# Patient Record
Sex: Male | Born: 1960 | Race: White | Hispanic: No | Marital: Single | Smoking: Former smoker
Health system: Southern US, Community
[De-identification: ages and names within clinical notes are randomized; demographics above are authoritative.]

## PROBLEM LIST (undated history)

## (undated) DIAGNOSIS — Z973 Presence of spectacles and contact lenses: Secondary | ICD-10-CM

## (undated) DIAGNOSIS — L259 Unspecified contact dermatitis, unspecified cause: Secondary | ICD-10-CM

## (undated) DIAGNOSIS — E119 Type 2 diabetes mellitus without complications: Secondary | ICD-10-CM

## (undated) DIAGNOSIS — M129 Arthropathy, unspecified: Secondary | ICD-10-CM

## (undated) DIAGNOSIS — R011 Cardiac murmur, unspecified: Secondary | ICD-10-CM

## (undated) DIAGNOSIS — G8929 Other chronic pain: Secondary | ICD-10-CM

## (undated) DIAGNOSIS — R51 Headache: Secondary | ICD-10-CM

## (undated) DIAGNOSIS — M869 Osteomyelitis, unspecified: Secondary | ICD-10-CM

## (undated) DIAGNOSIS — E785 Hyperlipidemia, unspecified: Secondary | ICD-10-CM

## (undated) DIAGNOSIS — R0602 Shortness of breath: Secondary | ICD-10-CM

## (undated) DIAGNOSIS — M549 Dorsalgia, unspecified: Secondary | ICD-10-CM

## (undated) DIAGNOSIS — M1990 Unspecified osteoarthritis, unspecified site: Secondary | ICD-10-CM

## (undated) DIAGNOSIS — G709 Myoneural disorder, unspecified: Secondary | ICD-10-CM

## (undated) DIAGNOSIS — J40 Bronchitis, not specified as acute or chronic: Secondary | ICD-10-CM

## (undated) HISTORY — PX: HX BELOW KNEE AMPUTATION: SHX116

## (undated) HISTORY — PX: HX TONSILLECTOMY: SHX27

## (undated) HISTORY — PX: HX ADENOIDECTOMY: SHX29

## (undated) HISTORY — PX: ESOPHAGOGASTRODUODENOSCOPY: SHX1529

## (undated) HISTORY — PX: TONSILLECTOMY: SUR1361

## (undated) HISTORY — PX: AMPUTATION: SHX166

## (undated) HISTORY — PX: IRRIGATION AND DEBRIDEMENT KNEE: SHX5185

---

## 2010-10-29 HISTORY — PX: TOE AMPUTATION: SHX809

## 2012-03-19 ENCOUNTER — Encounter (HOSPITAL_BASED_OUTPATIENT_CLINIC_OR_DEPARTMENT_OTHER): Payer: PRIVATE HEALTH INSURANCE | Attending: General Surgery

## 2012-03-19 DIAGNOSIS — L97809 Non-pressure chronic ulcer of other part of unspecified lower leg with unspecified severity: Secondary | ICD-10-CM | POA: Insufficient documentation

## 2012-03-19 DIAGNOSIS — L97509 Non-pressure chronic ulcer of other part of unspecified foot with unspecified severity: Secondary | ICD-10-CM | POA: Insufficient documentation

## 2012-03-19 DIAGNOSIS — I872 Venous insufficiency (chronic) (peripheral): Secondary | ICD-10-CM | POA: Insufficient documentation

## 2012-03-19 DIAGNOSIS — E1169 Type 2 diabetes mellitus with other specified complication: Secondary | ICD-10-CM | POA: Insufficient documentation

## 2012-03-19 NOTE — Progress Notes (Signed)
[facility]  NAME:  Gabriel Howe, PROPES NO.:  [id number]  MEDICAL RECORD NO.:  [id number]      DATE OF BIRTH:  18-Nov-1960  PHYSICIAN:  Ardath Sax, M.D.           VISIT DATE:                                  OFFICE VISIT   This is a very nice gentleman, 51 years of age, who has a diabetes for about 15 years.  He is on metformin for this problem.  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  He enters our wound clinic because of a diabetic foot ulcer on his first MP joint plantar aspect of the left foot.  He also has bilateral venous stasis ulcers,  3-4 on each leg, that are couple of centimeter in diameter.  He has been treated before with Unna boots.  He has also had cast before. He has had many other treatments including Apligraf.  Today, we debrided these ulcers and are putting him with collagen in all the wounds and Unna boots.  He was advised to wear his off-loading boot on the left leg to try to get the weight off of his foot as I think it is contributing to this ulcer on the plantar aspect, which is about 2 cm in diameter.  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  He was afebrile.  His blood pressure is 140/84.  His temperature is 98.6.  He is very agreeable and I think will cooperate in any way.  He can try to get these things healed.  He thinks it would be difficult for him to wear cast, but he feels like he can certainly put up or wear any off-loading boot.  So, we will do that and will offload him and will use collagen and perhaps think about using Dermagrafts when he returns.  He will come back in a week.     Ardath Sax, M.D.     PP/MEDQ  D:  03/19/2012  T:  03/19/2012  Job:  [id number]

## 2012-04-02 ENCOUNTER — Encounter (HOSPITAL_BASED_OUTPATIENT_CLINIC_OR_DEPARTMENT_OTHER): Payer: PRIVATE HEALTH INSURANCE | Attending: General Surgery

## 2012-04-02 DIAGNOSIS — E1169 Type 2 diabetes mellitus with other specified complication: Secondary | ICD-10-CM | POA: Insufficient documentation

## 2012-04-02 DIAGNOSIS — I872 Venous insufficiency (chronic) (peripheral): Secondary | ICD-10-CM | POA: Insufficient documentation

## 2012-04-02 DIAGNOSIS — L97509 Non-pressure chronic ulcer of other part of unspecified foot with unspecified severity: Secondary | ICD-10-CM | POA: Insufficient documentation

## 2012-04-04 ENCOUNTER — Encounter (INDEPENDENT_AMBULATORY_CARE_PROVIDER_SITE_OTHER): Payer: PRIVATE HEALTH INSURANCE | Admitting: *Deleted

## 2012-04-04 ENCOUNTER — Ambulatory Visit (INDEPENDENT_AMBULATORY_CARE_PROVIDER_SITE_OTHER): Payer: PRIVATE HEALTH INSURANCE | Admitting: *Deleted

## 2012-04-04 DIAGNOSIS — I739 Peripheral vascular disease, unspecified: Secondary | ICD-10-CM

## 2012-04-04 DIAGNOSIS — I872 Venous insufficiency (chronic) (peripheral): Secondary | ICD-10-CM

## 2012-04-09 NOTE — Procedures (Unsigned)
DUPLEX DEEP VENOUS EXAM - LOWER EXTREMITY  INDICATION:  Chronic venous insufficiency.  HISTORY:  Edema:  No Trauma/Surgery:  No Pain:  No PE:  No Previous DVT:  No Anticoagulants: Other:  Chronic lower extremity ulcers for the last 20 years off and on  DUPLEX EXAM:               CFV   SFV   PopV  PTV    GSV               R  L  R  L  R  L  R   L  R  L Thrombosis    o  o  o  o  o  o         o  o Spontaneous   +  +  +  +  +  +         +  + Phasic        +  +  +  +  +  +         +  + Augmentation  +  +  +  +  +  +         +  + Compressible  +  +  +  +  +  +         +  + Competent     o  o  o  +  o  +         o  o  Legend:  + - yes  o - no  p - partial  D - decreased  IMPRESSION: 1. No evidence of venous thrombus noted in the bilateral femoral     popliteal venous systems and the bilateral great saphenous veins. 2. Unable to adequately visualize the bilateral calf veins due to     patient body habitus and lower extremity wounds. 3. Reflux of >500 milliseconds noted in the bilateral common femoral,     right femoral and right popliteal veins.  Reflux of >500     milliseconds noted throughout the right great saphenous vein with     diameter measurements ranging from 0.7 to 1.0 cm.  Reflux of >500     milliseconds noted in the left proximal thigh great saphenous vein     with diameter measurements ranging from 0.69 to 0.85 cm.   _____________________________ Larina Earthly, M.D.  CH/MEDQ  D:  04/07/2012  T:  04/07/2012  Job:  [id number]

## 2012-04-10 LAB — GLUCOSE, CAPILLARY: Glucose-Capillary: 309 mg/dL — ABNORMAL HIGH (ref 70–99)

## 2012-04-23 ENCOUNTER — Encounter (HOSPITAL_BASED_OUTPATIENT_CLINIC_OR_DEPARTMENT_OTHER): Payer: PRIVATE HEALTH INSURANCE

## 2012-04-23 NOTE — Progress Notes (Signed)
[facility]  NAME:  FREMAN, LAPAGE NO.:  [id number]  MEDICAL RECORD NO.:  [id number]      DATE OF BIRTH:  1961-10-27  PHYSICIAN:  Ardath Sax, M.D.           VISIT DATE:                                  OFFICE VISIT   Mr. Dacosta is a 51 year old diabetic man who has a dual diagnosis of diabetic foot ulcer on the plantar aspect of his left foot and he has got 3 venous stasis ulcers on the left leg and 4 venous stasis ulcers on the right leg.  He has been plagued with venous stasis for many years and has had problems from time to time with these ulcers.  They are being treated with Unna boots.  Today we got permission to put a Dermagraft on the diabetic foot ulcer on plantar aspect of his left foot, and we would like to get permission to put Apligraf on the venous stasis ulcers of both of his legs and that in conjunction with the Unna boots I think give [place] a good chance to get these ulcers heal.  So we would like to have his insurance company to give [place] the privilege to go ahead and use these treatments for the treatment of both the diabetic foot ulcer and venous stasis ulcers.     Ardath Sax, M.D.     PP/MEDQ  D:  04/23/2012  T:  04/23/2012  Job:  [id number]

## 2012-04-30 ENCOUNTER — Encounter (HOSPITAL_BASED_OUTPATIENT_CLINIC_OR_DEPARTMENT_OTHER): Payer: PRIVATE HEALTH INSURANCE | Attending: General Surgery

## 2012-04-30 DIAGNOSIS — E1169 Type 2 diabetes mellitus with other specified complication: Secondary | ICD-10-CM | POA: Insufficient documentation

## 2012-04-30 DIAGNOSIS — L97809 Non-pressure chronic ulcer of other part of unspecified lower leg with unspecified severity: Secondary | ICD-10-CM | POA: Insufficient documentation

## 2012-04-30 DIAGNOSIS — E1142 Type 2 diabetes mellitus with diabetic polyneuropathy: Secondary | ICD-10-CM | POA: Insufficient documentation

## 2012-04-30 DIAGNOSIS — L97509 Non-pressure chronic ulcer of other part of unspecified foot with unspecified severity: Secondary | ICD-10-CM | POA: Insufficient documentation

## 2012-04-30 DIAGNOSIS — E1149 Type 2 diabetes mellitus with other diabetic neurological complication: Secondary | ICD-10-CM | POA: Insufficient documentation

## 2012-04-30 DIAGNOSIS — S98139A Complete traumatic amputation of one unspecified lesser toe, initial encounter: Secondary | ICD-10-CM | POA: Insufficient documentation

## 2012-04-30 DIAGNOSIS — Z79899 Other long term (current) drug therapy: Secondary | ICD-10-CM | POA: Insufficient documentation

## 2012-04-30 DIAGNOSIS — I872 Venous insufficiency (chronic) (peripheral): Secondary | ICD-10-CM | POA: Insufficient documentation

## 2012-05-09 NOTE — Progress Notes (Signed)
[facility]  NAME:  Gabriel Howe, Gabriel Howe NO.:  [id number]  MEDICAL RECORD NO.:  [id number]      DATE OF BIRTH:  Dec 19, 1960  PHYSICIAN:  Ardath Sax, M.D.           VISIT DATE:                                  OFFICE VISIT   He is a 51 year old gentleman who has been a diabetic for 15 years that he treats with metformin and diet.  He has had a right great toe amputation because of a diabetic foot ulcer, and he is here because of multiple ulcers on both of his legs and on the plantar aspect of his left foot.  These ulcers are contributed to by diabetes and diabetic neuropathy, and he also has venous stasis to a marked degree.  We have been treating him with collagen and Unna boots, and now he is ready for biologic treatments,  because he is a diabetic and these are both venous stasis and diabetic in origin, we put a Dermagraft on the diabetic foot ulcer on the left foot, and we put Apligraf on the legs where he had venous stasis ulcers bilaterally, and we will. plan on following up with weekly visits and several more applications of these biologics.     Ardath Sax, M.D.     PP/MEDQ  D:  05/09/2012  T:  05/09/2012  Job:  [id number]

## 2012-06-04 ENCOUNTER — Encounter (HOSPITAL_BASED_OUTPATIENT_CLINIC_OR_DEPARTMENT_OTHER): Payer: PRIVATE HEALTH INSURANCE | Attending: General Surgery

## 2012-06-04 DIAGNOSIS — E1169 Type 2 diabetes mellitus with other specified complication: Secondary | ICD-10-CM | POA: Insufficient documentation

## 2012-06-04 DIAGNOSIS — Z79899 Other long term (current) drug therapy: Secondary | ICD-10-CM | POA: Insufficient documentation

## 2012-06-04 DIAGNOSIS — I872 Venous insufficiency (chronic) (peripheral): Secondary | ICD-10-CM | POA: Insufficient documentation

## 2012-06-04 DIAGNOSIS — S98139A Complete traumatic amputation of one unspecified lesser toe, initial encounter: Secondary | ICD-10-CM | POA: Insufficient documentation

## 2012-06-04 DIAGNOSIS — L97509 Non-pressure chronic ulcer of other part of unspecified foot with unspecified severity: Secondary | ICD-10-CM | POA: Insufficient documentation

## 2012-06-04 DIAGNOSIS — E1142 Type 2 diabetes mellitus with diabetic polyneuropathy: Secondary | ICD-10-CM | POA: Insufficient documentation

## 2012-06-04 DIAGNOSIS — L97809 Non-pressure chronic ulcer of other part of unspecified lower leg with unspecified severity: Secondary | ICD-10-CM | POA: Insufficient documentation

## 2012-06-04 DIAGNOSIS — E1149 Type 2 diabetes mellitus with other diabetic neurological complication: Secondary | ICD-10-CM | POA: Insufficient documentation

## 2012-07-02 ENCOUNTER — Encounter (HOSPITAL_BASED_OUTPATIENT_CLINIC_OR_DEPARTMENT_OTHER): Payer: PRIVATE HEALTH INSURANCE | Attending: General Surgery

## 2012-07-02 DIAGNOSIS — E1169 Type 2 diabetes mellitus with other specified complication: Secondary | ICD-10-CM | POA: Insufficient documentation

## 2012-07-02 DIAGNOSIS — L97809 Non-pressure chronic ulcer of other part of unspecified lower leg with unspecified severity: Secondary | ICD-10-CM | POA: Insufficient documentation

## 2012-07-02 DIAGNOSIS — I872 Venous insufficiency (chronic) (peripheral): Secondary | ICD-10-CM | POA: Insufficient documentation

## 2012-07-30 ENCOUNTER — Encounter (HOSPITAL_BASED_OUTPATIENT_CLINIC_OR_DEPARTMENT_OTHER): Payer: PRIVATE HEALTH INSURANCE | Attending: General Surgery

## 2012-07-30 DIAGNOSIS — E1169 Type 2 diabetes mellitus with other specified complication: Secondary | ICD-10-CM | POA: Insufficient documentation

## 2012-07-30 DIAGNOSIS — I890 Lymphedema, not elsewhere classified: Secondary | ICD-10-CM | POA: Insufficient documentation

## 2012-07-30 DIAGNOSIS — L97809 Non-pressure chronic ulcer of other part of unspecified lower leg with unspecified severity: Secondary | ICD-10-CM | POA: Insufficient documentation

## 2012-07-30 DIAGNOSIS — I872 Venous insufficiency (chronic) (peripheral): Secondary | ICD-10-CM | POA: Insufficient documentation

## 2012-07-30 DIAGNOSIS — L97309 Non-pressure chronic ulcer of unspecified ankle with unspecified severity: Secondary | ICD-10-CM | POA: Insufficient documentation

## 2012-09-03 ENCOUNTER — Encounter (HOSPITAL_BASED_OUTPATIENT_CLINIC_OR_DEPARTMENT_OTHER): Payer: PRIVATE HEALTH INSURANCE | Attending: General Surgery

## 2012-09-03 DIAGNOSIS — M069 Rheumatoid arthritis, unspecified: Secondary | ICD-10-CM | POA: Insufficient documentation

## 2012-09-03 DIAGNOSIS — Z79899 Other long term (current) drug therapy: Secondary | ICD-10-CM | POA: Insufficient documentation

## 2012-09-03 DIAGNOSIS — E1169 Type 2 diabetes mellitus with other specified complication: Secondary | ICD-10-CM | POA: Insufficient documentation

## 2012-09-03 DIAGNOSIS — L97509 Non-pressure chronic ulcer of other part of unspecified foot with unspecified severity: Secondary | ICD-10-CM | POA: Insufficient documentation

## 2012-09-03 DIAGNOSIS — L97809 Non-pressure chronic ulcer of other part of unspecified lower leg with unspecified severity: Secondary | ICD-10-CM | POA: Insufficient documentation

## 2012-09-03 NOTE — Progress Notes (Signed)
[facility]  NAME:  Gabriel Howe, Gabriel Howe NO.:  [id number]  MEDICAL RECORD NO.:  [id number]      DATE OF BIRTH:  [birth year]  PHYSICIAN:  Ardath Sax, M.D.           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  He also has rheumatoid arthritis.  He is 6 feet, 8 inches tall and weighs 425 pounds and the only medicines he take is lovastatin, metformin and glipizide.  He has been plagued with both venous stasis ulcers and diabetic foot ulcers. At the present time his big problem is on the plantar aspect of his left great toe.  He has got a Wagner 3 diabetic foot ulcer.  He also has 1 on the dorsal aspect of his right second toe.  He has venous ulcers on the medial side of both of his legs that have been treated with Apligraf. We have had some success with the venous ulcers, but none with the diabetic foot ulcers Wagner 3.  We are applying for permission to use hyperbaric oxygen to try to get this man out of his misery with the diabetic foot ulcers.  I think that having had these this long and having already tried Apligraf that have not really helped, it would be worthwhile to continue our active treatments here at the wound clinic weekly, but I feel that the hyperbaric oxygen treatments would be of great benefit on trying to get this man over his problem.     Ardath Sax, M.D.     PP/MEDQ  D:  09/03/2012  T:  09/03/2012  Job:  [id number]

## 2012-09-05 ENCOUNTER — Ambulatory Visit (HOSPITAL_COMMUNITY)
Admission: RE | Admit: 2012-09-05 | Discharge: 2012-09-05 | Disposition: A | Discharge status: Home / Self Care | Payer: PRIVATE HEALTH INSURANCE | Source: Ambulatory Visit | Attending: General Surgery | Admitting: General Surgery

## 2012-09-05 ENCOUNTER — Other Ambulatory Visit (HOSPITAL_BASED_OUTPATIENT_CLINIC_OR_DEPARTMENT_OTHER): Payer: Self-pay | Admitting: General Surgery

## 2012-09-05 DIAGNOSIS — L98499 Non-pressure chronic ulcer of skin of other sites with unspecified severity: Secondary | ICD-10-CM

## 2012-09-05 DIAGNOSIS — T8189XA Other complications of procedures, not elsewhere classified, initial encounter: Secondary | ICD-10-CM | POA: Insufficient documentation

## 2012-09-05 DIAGNOSIS — Y849 Medical procedure, unspecified as the cause of abnormal reaction of the patient, or of later complication, without mention of misadventure at the time of the procedure: Secondary | ICD-10-CM | POA: Insufficient documentation

## 2012-09-05 DIAGNOSIS — M899 Disorder of bone, unspecified: Secondary | ICD-10-CM | POA: Insufficient documentation

## 2012-09-05 DIAGNOSIS — S98919A Complete traumatic amputation of unspecified foot, level unspecified, initial encounter: Secondary | ICD-10-CM | POA: Insufficient documentation

## 2012-09-05 DIAGNOSIS — M7730 Calcaneal spur, unspecified foot: Secondary | ICD-10-CM | POA: Insufficient documentation

## 2012-09-05 DIAGNOSIS — Z01818 Encounter for other preprocedural examination: Secondary | ICD-10-CM | POA: Insufficient documentation

## 2012-09-05 DIAGNOSIS — M619 Calcification and ossification of muscle, unspecified: Secondary | ICD-10-CM | POA: Insufficient documentation

## 2012-09-05 DIAGNOSIS — M949 Disorder of cartilage, unspecified: Secondary | ICD-10-CM | POA: Insufficient documentation

## 2012-09-10 ENCOUNTER — Emergency Department (HOSPITAL_COMMUNITY)
Admission: EM | Admit: 2012-09-10 | Discharge: 2012-09-10 | Disposition: A | Discharge status: Home / Self Care | Payer: PRIVATE HEALTH INSURANCE | Attending: Emergency Medicine | Admitting: Emergency Medicine

## 2012-09-10 ENCOUNTER — Encounter (HOSPITAL_COMMUNITY): Payer: Self-pay | Admitting: Emergency Medicine

## 2012-09-10 DIAGNOSIS — E1169 Type 2 diabetes mellitus with other specified complication: Secondary | ICD-10-CM | POA: Insufficient documentation

## 2012-09-10 DIAGNOSIS — Y838 Other surgical procedures as the cause of abnormal reaction of the patient, or of later complication, without mention of misadventure at the time of the procedure: Secondary | ICD-10-CM | POA: Insufficient documentation

## 2012-09-10 DIAGNOSIS — L97509 Non-pressure chronic ulcer of other part of unspecified foot with unspecified severity: Secondary | ICD-10-CM | POA: Insufficient documentation

## 2012-09-10 DIAGNOSIS — E11621 Type 2 diabetes mellitus with foot ulcer: Secondary | ICD-10-CM

## 2012-09-10 LAB — BASIC METABOLIC PANEL
CO2: 25 mEq/L (ref 19–32)
Chloride: 96 mEq/L (ref 96–112)
Glucose, Bld: 357 mg/dL — ABNORMAL HIGH (ref 70–99)
Potassium: 4.4 mEq/L (ref 3.5–5.1)
Sodium: 131 mEq/L — ABNORMAL LOW (ref 135–145)

## 2012-09-10 LAB — CBC WITH DIFFERENTIAL/PLATELET
Eosinophils Absolute: 0.1 10*3/uL (ref 0.0–0.7)
Eosinophils Relative: 1 % (ref 0–5)
Hemoglobin: 14.3 g/dL (ref 13.0–17.0)
Lymphocytes Relative: 32 % (ref 12–46)
Lymphs Abs: 3.3 10*3/uL (ref 0.7–4.0)
MCH: 25.8 pg — ABNORMAL LOW (ref 26.0–34.0)
MCV: 78.2 fL (ref 78.0–100.0)
Monocytes Relative: 8 % (ref 3–12)
RBC: 5.54 MIL/uL (ref 4.22–5.81)

## 2012-09-10 MED ORDER — PENICILLIN V POTASSIUM 500 MG PO TABS: 500.0000 mg | ORAL_TABLET | Freq: Four times a day (QID) | ORAL | Status: DC

## 2012-09-10 MED ORDER — SODIUM CHLORIDE 0.9 % IV SOLN
INTRAVENOUS | Status: DC
  Administered 2012-09-10: 10:00:00 via INTRAVENOUS

## 2012-09-10 MED ORDER — CIPROFLOXACIN HCL 500 MG PO TABS: 500.0000 mg | ORAL_TABLET | Freq: Two times a day (BID) | ORAL | Status: DC

## 2012-09-10 MED ORDER — CIPROFLOXACIN IN D5W 400 MG/200ML IV SOLN
400.0000 mg | Freq: Once | INTRAVENOUS | Status: AC
  Administered 2012-09-10: 400 mg via INTRAVENOUS
  Filled 2012-09-10: qty 200

## 2012-09-10 MED ORDER — VANCOMYCIN HCL IN DEXTROSE 1-5 GM/200ML-% IV SOLN
1000.0000 mg | Freq: Once | INTRAVENOUS | Status: AC
  Administered 2012-09-10: 1000 mg via INTRAVENOUS
  Filled 2012-09-10: qty 200

## 2012-09-10 NOTE — ED Provider Notes (Addendum)
History     CSN: [id number]  Arrival date & time 09/10/12  0847   First MD Initiated Contact with Patient 09/10/12 [phone]      Chief Complaint  Patient presents with  . Wound Infection    (Consider location/radiation/quality/duration/timing/severity/associated sxs/prior treatment) The history is provided by the patient and medical records.  -year-old morbidly obese, male, with diabetes.  Presents emergency apartment from the wound center for evaluation of an ulcer on his right second toe.  We'll treat him it was positive for bacteria, so they sent him here for IV antibiotics.  The patient denies pain.  He denies nausea, vomiting, fevers, or chills.  He is asymptomatic.  Past Medical History  Diagnosis Date  . Diabetes mellitus without complication     Past Surgical History  Procedure Date  . Toe amputation     History reviewed. No pertinent family history.  History  Substance Use Topics  . Smoking status: Never Smoker   . Smokeless tobacco: Not on file  . Alcohol Use: No      Review of Systems  Skin:       Right lower extremity ulceration  All other systems reviewed and are negative.    Allergies  Bactroban  Home Medications   Current Outpatient Rx  Name  Route  Sig  Dispense  Refill  . DOXYCYCLINE HYCLATE 100 MG PO CAPS   Oral   Take 100 mg by mouth 2 (two) times daily.           BP 138/84  Pulse 88  Temp 98.5 F (36.9 C) (Oral)  Resp 18  SpO2 99%  Physical Exam  Nursing note and vitals reviewed. Constitutional: He is oriented to person, place, and time. No distress.       Morbidly obese  HENT:  Head: Normocephalic and atraumatic.  Eyes: Conjunctivae normal are normal.  Neck: Normal range of motion.  Pulmonary/Chest: Effort normal.  Musculoskeletal: Normal range of motion. He exhibits no edema and no tenderness.  Neurological: He is alert and oriented to person, place, and time.  Skin: Skin is warm and dry.       One half syndrome,  circular ulceration over the dorsal medial aspect of the second toe on the right foot first toe has been amputated  Psychiatric: He has a normal mood and affect. Thought content normal.    ED Course  Procedures (including critical care time)   Labs Reviewed  BASIC METABOLIC PANEL  CBC WITH DIFFERENTIAL   No results found.   No diagnosis found.  Right second toe ulceration.  No symptoms to systemic illness  MDM  Diabetic foot ulcer - no evidence of systemic illness.        Cheri Guppy, MD 09/10/12 0930  Cheri Guppy, MD 09/10/12 [phone]

## 2012-09-10 NOTE — ED Notes (Signed)
Pt was sent here by wound center for IV antibiotics.  Pt was on doxycycline for bilateral foot wounds but was cultured and found to have an abundance of "I don't remember what it's called bacteria".  Pt states he has had fever and chills this week.  Pt has diabetic neuropathy and had his big toe on the right foot removed a year and a half ago but has had wound infections show up on both feet and up his shin.  "I've been dealing with this on and off for 20 years.

## 2012-09-29 ENCOUNTER — Encounter (HOSPITAL_BASED_OUTPATIENT_CLINIC_OR_DEPARTMENT_OTHER): Payer: PRIVATE HEALTH INSURANCE | Attending: General Surgery

## 2012-09-29 DIAGNOSIS — E1169 Type 2 diabetes mellitus with other specified complication: Secondary | ICD-10-CM | POA: Insufficient documentation

## 2012-09-29 DIAGNOSIS — I87309 Chronic venous hypertension (idiopathic) without complications of unspecified lower extremity: Secondary | ICD-10-CM | POA: Insufficient documentation

## 2012-09-29 DIAGNOSIS — L97809 Non-pressure chronic ulcer of other part of unspecified lower leg with unspecified severity: Secondary | ICD-10-CM | POA: Insufficient documentation

## 2012-09-29 DIAGNOSIS — I872 Venous insufficiency (chronic) (peripheral): Secondary | ICD-10-CM | POA: Insufficient documentation

## 2012-09-29 DIAGNOSIS — L97509 Non-pressure chronic ulcer of other part of unspecified foot with unspecified severity: Secondary | ICD-10-CM | POA: Insufficient documentation

## 2012-10-01 ENCOUNTER — Encounter (HOSPITAL_BASED_OUTPATIENT_CLINIC_OR_DEPARTMENT_OTHER): Payer: PRIVATE HEALTH INSURANCE

## 2012-10-13 LAB — GLUCOSE, CAPILLARY
Glucose-Capillary: 419 mg/dL — ABNORMAL HIGH (ref 70–99)
Glucose-Capillary: 376 mg/dL — ABNORMAL HIGH (ref 70–99)

## 2012-10-14 LAB — GLUCOSE, CAPILLARY
Glucose-Capillary: 358 mg/dL — ABNORMAL HIGH (ref 70–99)
Glucose-Capillary: 348 mg/dL — ABNORMAL HIGH (ref 70–99)

## 2012-10-15 LAB — GLUCOSE, CAPILLARY: Glucose-Capillary: 333 mg/dL — ABNORMAL HIGH (ref 70–99)

## 2012-10-16 LAB — GLUCOSE, CAPILLARY
Glucose-Capillary: 338 mg/dL — ABNORMAL HIGH (ref 70–99)
Glucose-Capillary: 344 mg/dL — ABNORMAL HIGH (ref 70–99)

## 2012-10-20 LAB — GLUCOSE, CAPILLARY: Glucose-Capillary: 349 mg/dL — ABNORMAL HIGH (ref 70–99)

## 2012-10-23 LAB — GLUCOSE, CAPILLARY: Glucose-Capillary: 322 mg/dL — ABNORMAL HIGH (ref 70–99)

## 2012-10-24 LAB — GLUCOSE, CAPILLARY
Glucose-Capillary: 344 mg/dL — ABNORMAL HIGH (ref 70–99)
Glucose-Capillary: 338 mg/dL — ABNORMAL HIGH (ref 70–99)

## 2012-10-28 LAB — GLUCOSE, CAPILLARY: Glucose-Capillary: 382 mg/dL — ABNORMAL HIGH (ref 70–99)

## 2012-10-30 ENCOUNTER — Encounter (HOSPITAL_BASED_OUTPATIENT_CLINIC_OR_DEPARTMENT_OTHER): Payer: PRIVATE HEALTH INSURANCE | Attending: General Surgery

## 2012-10-30 DIAGNOSIS — L97509 Non-pressure chronic ulcer of other part of unspecified foot with unspecified severity: Secondary | ICD-10-CM | POA: Insufficient documentation

## 2012-10-30 DIAGNOSIS — E1169 Type 2 diabetes mellitus with other specified complication: Secondary | ICD-10-CM | POA: Insufficient documentation

## 2012-10-30 DIAGNOSIS — I872 Venous insufficiency (chronic) (peripheral): Secondary | ICD-10-CM | POA: Insufficient documentation

## 2012-10-31 LAB — GLUCOSE, CAPILLARY
Glucose-Capillary: 426 mg/dL — ABNORMAL HIGH (ref 70–99)
Glucose-Capillary: 400 mg/dL — ABNORMAL HIGH (ref 70–99)

## 2012-11-03 LAB — GLUCOSE, CAPILLARY
Glucose-Capillary: 372 mg/dL — ABNORMAL HIGH (ref 70–99)
Glucose-Capillary: 378 mg/dL — ABNORMAL HIGH (ref 70–99)

## 2012-11-04 LAB — GLUCOSE, CAPILLARY: Glucose-Capillary: 336 mg/dL — ABNORMAL HIGH (ref 70–99)

## 2012-11-05 LAB — GLUCOSE, CAPILLARY: Glucose-Capillary: 327 mg/dL — ABNORMAL HIGH (ref 70–99)

## 2012-11-07 LAB — GLUCOSE, CAPILLARY
Glucose-Capillary: 329 mg/dL — ABNORMAL HIGH (ref 70–99)
Glucose-Capillary: 325 mg/dL — ABNORMAL HIGH (ref 70–99)

## 2012-11-17 LAB — GLUCOSE, CAPILLARY: Glucose-Capillary: 334 mg/dL — ABNORMAL HIGH (ref 70–99)

## 2012-11-19 LAB — GLUCOSE, CAPILLARY
Glucose-Capillary: 318 mg/dL — ABNORMAL HIGH (ref 70–99)
Glucose-Capillary: 315 mg/dL — ABNORMAL HIGH (ref 70–99)

## 2012-11-20 LAB — GLUCOSE, CAPILLARY: Glucose-Capillary: 381 mg/dL — ABNORMAL HIGH (ref 70–99)

## 2012-11-25 LAB — GLUCOSE, CAPILLARY
Glucose-Capillary: 325 mg/dL — ABNORMAL HIGH (ref 70–99)
Glucose-Capillary: 301 mg/dL — ABNORMAL HIGH (ref 70–99)

## 2012-12-01 ENCOUNTER — Encounter (HOSPITAL_BASED_OUTPATIENT_CLINIC_OR_DEPARTMENT_OTHER): Payer: PRIVATE HEALTH INSURANCE | Attending: General Surgery

## 2012-12-01 DIAGNOSIS — I87319 Chronic venous hypertension (idiopathic) with ulcer of unspecified lower extremity: Secondary | ICD-10-CM | POA: Insufficient documentation

## 2012-12-01 DIAGNOSIS — L97509 Non-pressure chronic ulcer of other part of unspecified foot with unspecified severity: Secondary | ICD-10-CM | POA: Insufficient documentation

## 2012-12-01 DIAGNOSIS — L97909 Non-pressure chronic ulcer of unspecified part of unspecified lower leg with unspecified severity: Secondary | ICD-10-CM | POA: Insufficient documentation

## 2012-12-01 DIAGNOSIS — E1169 Type 2 diabetes mellitus with other specified complication: Secondary | ICD-10-CM | POA: Insufficient documentation

## 2012-12-02 LAB — GLUCOSE, CAPILLARY: Glucose-Capillary: 334 mg/dL — ABNORMAL HIGH (ref 70–99)

## 2012-12-03 LAB — GLUCOSE, CAPILLARY
Glucose-Capillary: 327 mg/dL — ABNORMAL HIGH (ref 70–99)
Glucose-Capillary: 293 mg/dL — ABNORMAL HIGH (ref 70–99)

## 2012-12-04 ENCOUNTER — Encounter (HOSPITAL_BASED_OUTPATIENT_CLINIC_OR_DEPARTMENT_OTHER): Payer: PRIVATE HEALTH INSURANCE

## 2012-12-04 LAB — GLUCOSE, CAPILLARY: Glucose-Capillary: 305 mg/dL — ABNORMAL HIGH (ref 70–99)

## 2012-12-05 NOTE — Progress Notes (Signed)
[facility]  NAME:  Gabriel Howe, Gabriel Howe NO.:  [id number]  MEDICAL RECORD NO.:  [id number]      DATE OF BIRTH:  [birth year]  PHYSICIAN:  Ardath Sax, M.D.           VISIT DATE:                                  OFFICE VISIT   He is a 52 year old gentleman who we have been taking care of at the Wound Clinic for well over a year.  He is a diabetic and he has a combination of diabetic foot ulcers on the plantar aspect of his feet and venous hypertension ulcers on his calves from chronic venous stasis. He has healed one of these diabetic foot ulcers and all of the venous ulcers since he started hyperbaric oxygen treatments.  At the present time, the wounds have cleaned up very very nicely and we are putting on Dermagrafts on the plantar Wagner 3 diabetic foot ulcers and I feel because of the great size, we made an healing three of these ulcers.  I want to continue the hyperbaric oxygen for another session while we continue putting on Dermagrafts and perhaps we can finally get this gentleman's diabetic foot ulcers heal.     Ardath Sax, M.D.     PP/MEDQ  D:  12/05/2012  T:  12/05/2012  Job:  [id number]

## 2012-12-08 LAB — GLUCOSE, CAPILLARY: Glucose-Capillary: 304 mg/dL — ABNORMAL HIGH (ref 70–99)

## 2012-12-15 ENCOUNTER — Other Ambulatory Visit (HOSPITAL_BASED_OUTPATIENT_CLINIC_OR_DEPARTMENT_OTHER): Payer: Self-pay | Admitting: General Surgery

## 2012-12-15 DIAGNOSIS — T148XXA Other injury of unspecified body region, initial encounter: Secondary | ICD-10-CM

## 2012-12-17 ENCOUNTER — Inpatient Hospital Stay (HOSPITAL_COMMUNITY)
Admission: EM | Admit: 2012-12-17 | Discharge: 2012-12-21 | DRG: 294 | Disposition: A | Discharge status: Home / Self Care | Payer: BC Managed Care – PPO | Attending: Internal Medicine | Admitting: Internal Medicine

## 2012-12-17 ENCOUNTER — Encounter (HOSPITAL_COMMUNITY): Payer: Self-pay | Admitting: Emergency Medicine

## 2012-12-17 ENCOUNTER — Emergency Department (HOSPITAL_COMMUNITY): Payer: BC Managed Care – PPO

## 2012-12-17 DIAGNOSIS — M3390 Dermatopolymyositis, unspecified, organ involvement unspecified: Secondary | ICD-10-CM | POA: Diagnosis present

## 2012-12-17 DIAGNOSIS — M9080 Osteopathy in diseases classified elsewhere, unspecified site: Secondary | ICD-10-CM | POA: Diagnosis present

## 2012-12-17 DIAGNOSIS — E11621 Type 2 diabetes mellitus with foot ulcer: Secondary | ICD-10-CM | POA: Diagnosis present

## 2012-12-17 DIAGNOSIS — D72829 Elevated white blood cell count, unspecified: Secondary | ICD-10-CM

## 2012-12-17 DIAGNOSIS — E11628 Type 2 diabetes mellitus with other skin complications: Secondary | ICD-10-CM

## 2012-12-17 DIAGNOSIS — Z888 Allergy status to other drugs, medicaments and biological substances status: Secondary | ICD-10-CM

## 2012-12-17 DIAGNOSIS — E871 Hypo-osmolality and hyponatremia: Secondary | ICD-10-CM | POA: Diagnosis present

## 2012-12-17 DIAGNOSIS — S98139A Complete traumatic amputation of one unspecified lesser toe, initial encounter: Secondary | ICD-10-CM

## 2012-12-17 DIAGNOSIS — L02419 Cutaneous abscess of limb, unspecified: Secondary | ICD-10-CM | POA: Diagnosis present

## 2012-12-17 DIAGNOSIS — E119 Type 2 diabetes mellitus without complications: Secondary | ICD-10-CM | POA: Diagnosis present

## 2012-12-17 DIAGNOSIS — E1142 Type 2 diabetes mellitus with diabetic polyneuropathy: Secondary | ICD-10-CM | POA: Diagnosis present

## 2012-12-17 DIAGNOSIS — Z79899 Other long term (current) drug therapy: Secondary | ICD-10-CM

## 2012-12-17 DIAGNOSIS — L97509 Non-pressure chronic ulcer of other part of unspecified foot with unspecified severity: Secondary | ICD-10-CM | POA: Diagnosis present

## 2012-12-17 DIAGNOSIS — L03039 Cellulitis of unspecified toe: Secondary | ICD-10-CM | POA: Diagnosis present

## 2012-12-17 DIAGNOSIS — E1169 Type 2 diabetes mellitus with other specified complication: Principal | ICD-10-CM | POA: Diagnosis present

## 2012-12-17 DIAGNOSIS — M3313 Other dermatomyositis without myopathy: Secondary | ICD-10-CM | POA: Diagnosis present

## 2012-12-17 DIAGNOSIS — E1140 Type 2 diabetes mellitus with diabetic neuropathy, unspecified: Secondary | ICD-10-CM | POA: Diagnosis present

## 2012-12-17 DIAGNOSIS — L02619 Cutaneous abscess of unspecified foot: Secondary | ICD-10-CM | POA: Diagnosis present

## 2012-12-17 DIAGNOSIS — E1149 Type 2 diabetes mellitus with other diabetic neurological complication: Secondary | ICD-10-CM | POA: Diagnosis present

## 2012-12-17 DIAGNOSIS — L03119 Cellulitis of unspecified part of limb: Secondary | ICD-10-CM | POA: Diagnosis present

## 2012-12-17 DIAGNOSIS — E876 Hypokalemia: Secondary | ICD-10-CM | POA: Diagnosis not present

## 2012-12-17 DIAGNOSIS — M869 Osteomyelitis, unspecified: Secondary | ICD-10-CM | POA: Diagnosis present

## 2012-12-17 DIAGNOSIS — Z792 Long term (current) use of antibiotics: Secondary | ICD-10-CM

## 2012-12-17 DIAGNOSIS — IMO0002 Reserved for concepts with insufficient information to code with codable children: Principal | ICD-10-CM | POA: Diagnosis present

## 2012-12-17 LAB — CBC WITH DIFFERENTIAL/PLATELET
Lymphocytes Relative: 6 % — ABNORMAL LOW (ref 12–46)
Lymphs Abs: 1.1 10*3/uL (ref 0.7–4.0)
MCV: 75.7 fL — ABNORMAL LOW (ref 78.0–100.0)
Neutrophils Relative %: 90 % — ABNORMAL HIGH (ref 43–77)
Platelets: 257 10*3/uL (ref 150–400)
RBC: 5.07 MIL/uL (ref 4.22–5.81)
WBC: 19 10*3/uL — ABNORMAL HIGH (ref 4.0–10.5)

## 2012-12-17 LAB — BASIC METABOLIC PANEL
CO2: 23 mEq/L (ref 19–32)
Glucose, Bld: 334 mg/dL — ABNORMAL HIGH (ref 70–99)
Potassium: 4.3 mEq/L (ref 3.5–5.1)
Sodium: 126 mEq/L — ABNORMAL LOW (ref 135–145)

## 2012-12-17 MED ORDER — ACETAMINOPHEN 325 MG PO TABS
650.0000 mg | ORAL_TABLET | Freq: Once | ORAL | Status: AC
  Administered 2012-12-17: 650 mg via ORAL
  Filled 2012-12-17: qty 2

## 2012-12-17 MED ORDER — SODIUM CHLORIDE 0.9 % IV BOLUS (SEPSIS)
1000.0000 mL | Freq: Once | INTRAVENOUS | Status: AC
  Administered 2012-12-17: 1000 mL via INTRAVENOUS

## 2012-12-17 MED ORDER — ONDANSETRON HCL 4 MG/2ML IJ SOLN: 4.0000 mg | Freq: Once | INTRAMUSCULAR | Status: DC

## 2012-12-17 MED ORDER — VANCOMYCIN HCL IN DEXTROSE 1-5 GM/200ML-% IV SOLN
1000.0000 mg | Freq: Once | INTRAVENOUS | Status: AC
  Administered 2012-12-18: 1000 mg via INTRAVENOUS
  Filled 2012-12-17: qty 200

## 2012-12-17 MED ORDER — MORPHINE SULFATE 2 MG/ML IJ SOLN: 2.0000 mg | Freq: Once | INTRAMUSCULAR | Status: DC

## 2012-12-17 MED ORDER — PIPERACILLIN-TAZOBACTAM 3.375 G IVPB
3.3750 g | Freq: Once | INTRAVENOUS | Status: AC
  Administered 2012-12-17: 3.375 g via INTRAVENOUS
  Filled 2012-12-17: qty 50

## 2012-12-17 NOTE — ED Provider Notes (Signed)
History     CSN: [id number]  Arrival date & time 12/17/12  1851   First MD Initiated Contact with Patient 12/17/12 2113      Chief Complaint  Patient presents with  . Wound Check   HPI  History provided by the patient. Patient is a 52 year old male with history of diabetes, right great toe amputation and chronic right foot ulcers. Patient has been following up with the wound clinic for the past year or more. He has received hyperbaric treatments on his wounds. This past December he reports worsening of a wound on the bottom of his foot. He had an appointment at the wound center 2 days ago Monday with whom cultures performed but since that time reports increasing pain, foul odor, drainage, fever and chills. Patient has been currently taking Cipro and doxycycline. While at home today he was told by his doctor to discontinue emergency room for additional evaluation. He denies any aggravating or alleviating factors. Denies any other associated symptoms.    Past Medical History  Diagnosis Date  . Diabetes mellitus without complication     Past Surgical History  Procedure Laterality Date  . Toe amputation      History reviewed. No pertinent family history.  History  Substance Use Topics  . Smoking status: Never Smoker   . Smokeless tobacco: Not on file  . Alcohol Use: No      Review of Systems  Constitutional: Positive for fever, chills, diaphoresis and fatigue.  Respiratory: Negative for cough.   Gastrointestinal: Negative for vomiting and diarrhea.  All other systems reviewed and are negative.    Allergies  Bactroban  Home Medications   Current Outpatient Rx  Name  Route  Sig  Dispense  Refill  . ciprofloxacin (CIPRO) 500 MG tablet   Oral   Take 500 mg by mouth 2 (two) times daily.         Marland Kitchen doxycycline (VIBRA-TABS) 100 MG tablet   Oral   Take 100 mg by mouth 2 (two) times daily.           BP 114/64  Pulse 106  Temp(Src) 99.4 F (37.4 C) (Oral)   Resp 24  SpO2 99%  Physical Exam  Nursing note and vitals reviewed. Constitutional: He is oriented to person, place, and time. He appears well-developed and well-nourished. No distress.  Morbidly obese  HENT:  Head: Normocephalic.  Cardiovascular: Regular rhythm.  Tachycardia present.   Pulmonary/Chest: Effort normal and breath sounds normal. No respiratory distress.  Abdominal: Soft.  Musculoskeletal:  Amputation of right great toe. There is a wound to the second toe and foot. Large wounds to the bottom and medial left foot with drainage and foul odor. Generalized swelling of the right foot and lower leg with erythema.   Neurological: He is alert and oriented to person, place, and time.  Skin: Skin is warm.  Psychiatric: He has a normal mood and affect. His behavior is normal.    ED Course  Procedures   Results for orders placed during the hospital encounter of 12/17/12  CBC WITH DIFFERENTIAL      Result Value Range   WBC 19.0 (*) 4.0 - 10.5 K/uL   RBC 5.07  4.22 - 5.81 MIL/uL   Hemoglobin 12.4 (*) 13.0 - 17.0 g/dL   HCT 11.9 (*) 14.7 - 82.9 %   MCV 75.7 (*) 78.0 - 100.0 fL   MCH 24.5 (*) 26.0 - 34.0 pg   MCHC 32.3  30.0 - 36.0 g/dL  RDW 14.5  11.5 - 15.5 %   Platelets 257  150 - 400 K/uL   Neutrophils Relative 90 (*) 43 - 77 %   Neutro Abs 17.1 (*) 1.7 - 7.7 K/uL   Lymphocytes Relative 6 (*) 12 - 46 %   Lymphs Abs 1.1  0.7 - 4.0 K/uL   Monocytes Relative 4  3 - 12 %   Monocytes Absolute 0.8  0.1 - 1.0 K/uL   Eosinophils Relative 0  0 - 5 %   Eosinophils Absolute 0.0  0.0 - 0.7 K/uL   Basophils Relative 0  0 - 1 %   Basophils Absolute 0.0  0.0 - 0.1 K/uL  BASIC METABOLIC PANEL      Result Value Range   Sodium 126 (*) 135 - 145 mEq/L   Potassium 4.3  3.5 - 5.1 mEq/L   Chloride 90 (*) 96 - 112 mEq/L   CO2 23  19 - 32 mEq/L   Glucose, Bld 334 (*) 70 - 99 mg/dL   BUN 11  6 - 23 mg/dL   Creatinine, Ser 6.57  0.50 - 1.35 mg/dL   Calcium 8.9  8.4 - 84.6 mg/dL   GFR  calc non Af Amer >90  >90 mL/min   GFR calc Af Amer >90  >90 mL/min      No results found.   1. Diabetic foot infection   2. Leukocytosis       MDM  9:45 PM patient seen and evaluated. Patient appears uncomfortable.  Pt discussed with Attending Physician.  Patient with elevated WBC and clinical signs of infection to the foot. Will begin antibiotics and plan for admission.  Spoke with [facility] hospital[facility]. They would like an x-ray and blood cultures performed as well for the foot and they will see patient for admission.       Angus Seller, [place] 12/17/12 [phone]

## 2012-12-17 NOTE — ED Notes (Signed)
Pt states he does not want pain or nausea medication at this time. States he just wants antibiotics.

## 2012-12-17 NOTE — ED Notes (Signed)
Pt states he has been a pt at the wound care center for wound on his feet  Pt states he has been having problems with the ones on his right foot  Pt states last night he had fever, chills, and sweats  Pt states he is feeling weak  Pt states he had cultures on the wounds on Monday  Pt states he called his dr and was told to come here for possible admission

## 2012-12-18 ENCOUNTER — Inpatient Hospital Stay (HOSPITAL_COMMUNITY): Payer: BC Managed Care – PPO

## 2012-12-18 DIAGNOSIS — L089 Local infection of the skin and subcutaneous tissue, unspecified: Secondary | ICD-10-CM

## 2012-12-18 DIAGNOSIS — E1169 Type 2 diabetes mellitus with other specified complication: Secondary | ICD-10-CM

## 2012-12-18 LAB — HEMOGLOBIN A1C
Hgb A1c MFr Bld: 12.5 % — ABNORMAL HIGH (ref <5.7)
Mean Plasma Glucose: 312 mg/dL — ABNORMAL HIGH (ref <117)

## 2012-12-18 LAB — GLUCOSE, CAPILLARY
Glucose-Capillary: 336 mg/dL — ABNORMAL HIGH (ref 70–99)
Glucose-Capillary: 190 mg/dL — ABNORMAL HIGH (ref 70–99)

## 2012-12-18 MED ORDER — INSULIN GLARGINE 100 UNIT/ML ~~LOC~~ SOLN
15.0000 [IU] | Freq: Every day | SUBCUTANEOUS | Status: DC
  Administered 2012-12-18: 15 [IU] via SUBCUTANEOUS

## 2012-12-18 MED ORDER — ONDANSETRON HCL 4 MG/2ML IJ SOLN: 4.0000 mg | Freq: Four times a day (QID) | INTRAMUSCULAR | Status: DC | PRN

## 2012-12-18 MED ORDER — INSULIN ASPART 100 UNIT/ML ~~LOC~~ SOLN
0.0000 [IU] | Freq: Three times a day (TID) | SUBCUTANEOUS | Status: DC
  Administered 2012-12-18: 11 [IU] via SUBCUTANEOUS
  Administered 2012-12-18: 3 [IU] via SUBCUTANEOUS
  Administered 2012-12-18 – 2012-12-19 (×2): 8 [IU] via SUBCUTANEOUS
  Administered 2012-12-19: 3 [IU] via SUBCUTANEOUS
  Administered 2012-12-19 – 2012-12-21 (×5): 5 [IU] via SUBCUTANEOUS
  Administered 2012-12-21: 3 [IU] via SUBCUTANEOUS

## 2012-12-18 MED ORDER — ENOXAPARIN SODIUM 40 MG/0.4ML ~~LOC~~ SOLN
40.0000 mg | SUBCUTANEOUS | Status: DC
  Administered 2012-12-18 – 2012-12-19 (×2): 40 mg via SUBCUTANEOUS
  Filled 2012-12-18 (×2): qty 0.4

## 2012-12-18 MED ORDER — SODIUM CHLORIDE 0.9 % IV SOLN
INTRAVENOUS | Status: DC
  Administered 2012-12-18: 01:00:00 via INTRAVENOUS
  Administered 2012-12-18: 1000 mL via INTRAVENOUS
  Administered 2012-12-19 – 2012-12-20 (×3): via INTRAVENOUS

## 2012-12-18 MED ORDER — ONDANSETRON HCL 4 MG PO TABS: 4.0000 mg | ORAL_TABLET | Freq: Four times a day (QID) | ORAL | Status: DC | PRN

## 2012-12-18 MED ORDER — INSULIN ASPART 100 UNIT/ML ~~LOC~~ SOLN: 0.0000 [IU] | Freq: Every day | SUBCUTANEOUS | Status: DC

## 2012-12-18 MED ORDER — DOCUSATE SODIUM 100 MG PO CAPS
100.0000 mg | ORAL_CAPSULE | Freq: Two times a day (BID) | ORAL | Status: DC
  Filled 2012-12-18 (×8): qty 1

## 2012-12-18 MED ORDER — PIPERACILLIN-TAZOBACTAM 3.375 G IVPB 30 MIN
3.3750 g | Freq: Three times a day (TID) | INTRAVENOUS | Status: DC
  Administered 2012-12-18 – 2012-12-21 (×10): 3.375 g via INTRAVENOUS
  Filled 2012-12-18 (×13): qty 50

## 2012-12-18 MED ORDER — INFLUENZA VIRUS VACC SPLIT PF IM SUSP
0.5000 mL | Freq: Once | INTRAMUSCULAR | Status: AC
  Administered 2012-12-18: 0.5 mL via INTRAMUSCULAR
  Filled 2012-12-18 (×2): qty 0.5

## 2012-12-18 MED ORDER — VANCOMYCIN HCL 10 G IV SOLR
1500.0000 mg | Freq: Two times a day (BID) | INTRAVENOUS | Status: DC
  Administered 2012-12-18 – 2012-12-19 (×3): 1500 mg via INTRAVENOUS
  Filled 2012-12-18 (×5): qty 1500

## 2012-12-18 NOTE — ED Provider Notes (Signed)
Medical screening examination/treatment/procedure(s) were performed by non-physician practitioner and as supervising physician I was immediately available for consultation/collaboration.  Toy Baker, MD 12/18/12 1114

## 2012-12-18 NOTE — Progress Notes (Signed)
Will defer to ortho obtain CT Right foot. Patient weight limit for MRI.

## 2012-12-18 NOTE — Progress Notes (Signed)
Notified by MRI that patient is over wt. limit for our table, MD notified and said she will let ortho decide what to do instead.

## 2012-12-18 NOTE — Progress Notes (Signed)
1-Right foot ulcer, diabetic foot: Will order MRI. Continue with antibiotics, Vancomycin and Zosyn. Dr Dion Saucier consulted. 2-Diabetes: Was not taking metformin. No taking medications due to financial reasons. Will order HbA-1c. He will probably need lantus at discharge.  3-Hyponatremia: Continue with IV fluids.  Tinzley Dalia, Md.

## 2012-12-18 NOTE — Progress Notes (Signed)
ANTIBIOTIC CONSULT NOTE - INITIAL  Pharmacy Consult for vancomycin Indication: Cellulitis  Allergies  Allergen Reactions  . Bactroban (Mupirocin Calcium) Hives    Patient Measurements: Height: 6\' 8"  (203.2 cm) Weight: 381 lb 6.3 oz (173 kg) IBW/kg (Calculated) : 96 Adjusted Body Weight:   Vital Signs: Temp: 99 F (37.2 C) (02/20 0143) Temp src: Oral (02/20 0143) BP: 97/73 mmHg (02/20 0143) Pulse Rate: 106 (02/20 0143) Intake/Output from previous day: 02/19 0701 - 02/20 0700 In: 1000 [I.V.:1000] Out: -  Intake/Output from this shift: Total I/O In: 1000 [I.V.:1000] Out: -   Labs:  Recent Labs  12/17/12 2236  WBC 19.0*  HGB 12.4*  PLT 257  CREATININE 0.96   Estimated Creatinine Clearance: 165.1 ml/min (by C-G formula based on Cr of 0.96). No results found for this basename: VANCOTROUGH, VANCOPEAK, VANCORANDOM, GENTTROUGH, GENTPEAK, GENTRANDOM, TOBRATROUGH, TOBRAPEAK, TOBRARND, AMIKACINPEAK, AMIKACINTROU, AMIKACIN,  in the last 72 hours   Microbiology: No results found for this or any previous visit (from the past 720 hour(s)).  Medical History: Past Medical History  Diagnosis Date  . Diabetes mellitus without complication     Medications:  Anti-infectives   Start     Dose/Rate Route Frequency Ordered Stop   12/18/12 0800  vancomycin (VANCOCIN) 1,500 mg in sodium chloride 0.9 % 500 mL IVPB     1,500 mg 250 mL/hr over 120 Minutes Intravenous Every 12 hours 12/18/12 0227     12/18/12 0600  piperacillin-tazobactam (ZOSYN) IVPB 3.375 g     3.375 g 12.5 mL/hr over 240 Minutes Intravenous 3 times per day 12/18/12 0140     12/17/12 2300  vancomycin (VANCOCIN) IVPB 1000 mg/200 mL premix     1,000 mg 200 mL/hr over 60 Minutes Intravenous  Once 12/17/12 2257 12/18/12 0117   12/17/12 2300  piperacillin-tazobactam (ZOSYN) IVPB 3.375 g     3.375 g 100 mL/hr over 30 Minutes Intravenous  Once 12/17/12 2257 12/18/12 0028     Assessment: Patient with cellulitis.   First dose of antibiotics already given in ED.  Goal of Therapy:  Vancomycin trough level 10-15 mcg/ml  Plan:  Measure antibiotic drug levels at steady state Follow up culture results Vancomycin 1500mg  iv q12hr, next dose at 0800  Darlina Guys, Jacquenette Shone Crowford 12/18/2012,2:28 AM

## 2012-12-18 NOTE — ED Notes (Signed)
Pt refused x-ray to right foot at this time--- Dr. Conley Rolls, admitting MD, was notified, states okay to have x-ray done in the morning.
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
## 2012-12-18 NOTE — Care Management (Signed)
CARE MANAGEMENT NOTE 12/18/2012  Patient:  KRIS, NO   Account Number:  [id number]  Date Initiated:  12/18/2012  Documentation initiated by:  Jeri Rawlins  Subjective/Objective Assessment:   52 yo male admitted with right lower ext cellulitis. PTA pt employed, indepedent, followed at [facility].     Action/Plan:   Home when stable   Anticipated DC Date:     Anticipated DC Plan:  HOME/SELF CARE      DC Planning Services  CM consult      Choice offered to / List presented to:             Status of service:  In process, will continue to follow Medicare Important Message given?   (If response is "NO", the following Medicare IM given date fields will be blank) Date Medicare IM given:   Date Additional Medicare IM given:    Discharge Disposition:    Per UR Regulation:  Reviewed for med. necessity/level of care/duration of stay  If discussed at Long Length of Stay Meetings, dates discussed:    Comments:  12/18/12 1145 Shadell Brenn,RN,BSN [phone] CM spoke with patient concerning discharge planning.Pt states plkans to continue tx at [facility] upon discharge. No HH needs or services noted at this time. Ortho to consult.

## 2012-12-18 NOTE — Consult Note (Signed)
WOC consult Note Reason for Consult:Note that patient also has a consultation ordered for orthopedic surgery.  I will assess and provide orders for nursing to perform twice daily saline dressings pending other orders by ortho. Wound type: Neuropathic Pressure Ulcer POA: No Measurement:left great toe:  3.5cm x 1cm x 0.2cm with callous extension 2cm beyond that.  Wound bed is clean, moist. Right great toe is 2cm round x .2cm with yellow exudate and moist, pink base.  Right Charcot/medial midfoot is with 6x7x.4cm ulceration. Generally with red base, deeper center. Serous drainage. Wound bed:As described above. Drainage (amount, consistency, odor) As above Periwound:As above Dressing procedure/placement/frequency:Pending x-ray and surgical (ortho) evaluation, I have implemented twice daily saline dressings.  Patient is followed by the outpatient wound care center and should resume his care there upon discharge. I will not follow.  Please re-consult if needed. Thanks, Ladona Mow, MSN, RN, [facility], CWOCN [phone])

## 2012-12-18 NOTE — Consult Note (Signed)
Reason for Consult: Abscess osteomyelitis ulceration right foot. Referring Physician: [name] is an 52 y.o. male.  HPI: Patient is a 52 year old gentleman with diabetic insensate neuropathy. He has undergone prolonged conservative therapy for both lower extremities. He is status post a great toe amputation at [facility] on the right. He is status post prolonged treatment at the wound center for venous stasis ulcers on both legs as well as ulcerations on both feet. He has been through hyperbaric oxygen therapy. Patient states that he has uncontrolled diabetic and does not check his sugars.  Past Medical History  Diagnosis Date  . Diabetes mellitus without complication     Past Surgical History  Procedure Laterality Date  . Toe amputation      History reviewed. No pertinent family history.  Social History:  reports that he has never smoked. He does not have any smokeless tobacco history on file. He reports that he does not drink alcohol or use illicit drugs.  Allergies:  Allergies  Allergen Reactions  . Bactroban (Mupirocin Calcium) Hives    Medications: I have reviewed the patient's current medications.  Results for orders placed during the hospital encounter of 12/17/12 (from the past 48 hour(s))  CBC WITH DIFFERENTIAL     Status: Abnormal   Collection Time    12/17/12 10:36 PM      Result Value Range   WBC 19.0 (*) 4.0 - 10.5 K/uL   RBC 5.07  4.22 - 5.81 MIL/uL   Hemoglobin 12.4 (*) 13.0 - 17.0 g/dL   HCT 16.1 (*) 09.6 - 04.5 %   MCV 75.7 (*) 78.0 - 100.0 fL   MCH 24.5 (*) 26.0 - 34.0 pg   MCHC 32.3  30.0 - 36.0 g/dL   RDW 40.9  81.1 - 91.4 %   Platelets 257  150 - 400 K/uL   Neutrophils Relative 90 (*) 43 - 77 %   Neutro Abs 17.1 (*) 1.7 - 7.7 K/uL   Lymphocytes Relative 6 (*) 12 - 46 %   Lymphs Abs 1.1  0.7 - 4.0 K/uL   Monocytes Relative 4  3 - 12 %   Monocytes Absolute 0.8  0.1 - 1.0 K/uL   Eosinophils Relative 0  0 - 5 %   Eosinophils Absolute 0.0   0.0 - 0.7 K/uL   Basophils Relative 0  0 - 1 %   Basophils Absolute 0.0  0.0 - 0.1 K/uL  BASIC METABOLIC PANEL     Status: Abnormal   Collection Time    12/17/12 10:36 PM      Result Value Range   Sodium 126 (*) 135 - 145 mEq/L   Potassium 4.3  3.5 - 5.1 mEq/L   Chloride 90 (*) 96 - 112 mEq/L   CO2 23  19 - 32 mEq/L   Glucose, Bld 334 (*) 70 - 99 mg/dL   BUN 11  6 - 23 mg/dL   Creatinine, Ser 7.82  0.50 - 1.35 mg/dL   Calcium 8.9  8.4 - 95.6 mg/dL   GFR calc non Af Amer >90  >90 mL/min   GFR calc Af Amer >90  >90 mL/min   Comment:            The eGFR has been calculated     using the CKD EPI equation.     This calculation has not been     validated in all clinical     situations.     eGFR's persistently     <  90 mL/min signify     possible Chronic Kidney Disease.  HEMOGLOBIN A1C     Status: Abnormal   Collection Time    12/17/12 10:36 PM      Result Value Range   Hemoglobin A1C 12.5 (*) <5.7 %   Comment: (NOTE)                                                                               According to the ADA Clinical Practice Recommendations for 2011, when     HbA1c is used as a screening test:      >=6.5%   Diagnostic of Diabetes Mellitus               (if abnormal result is confirmed)     5.7-6.4%   Increased risk of developing Diabetes Mellitus     References:Diagnosis and Classification of Diabetes Mellitus,Diabetes     Care,2011,34(Suppl 1):S62-S69 and Standards of Medical Care in             Diabetes - 2011,Diabetes Care,2011,34 (Suppl 1):S11-S61.   Mean Plasma Glucose 312 (*) <117 mg/dL  GLUCOSE, CAPILLARY     Status: Abnormal   Collection Time    12/18/12  7:41 AM      Result Value Range   Glucose-Capillary 336 (*) 70 - 99 mg/dL  GLUCOSE, CAPILLARY     Status: Abnormal   Collection Time    12/18/12 11:57 AM      Result Value Range   Glucose-Capillary 264 (*) 70 - 99 mg/dL  GLUCOSE, CAPILLARY     Status: Abnormal   Collection Time    12/18/12  5:05 PM       Result Value Range   Glucose-Capillary 190 (*) 70 - 99 mg/dL    Dg Foot Complete Right  12/18/2012  *RADIOLOGY REPORT*  Clinical Data: Diabetic with nonhealing foot wound.  RIGHT FOOT COMPLETE - 3+ VIEW  Comparison: Toe radiographs 09/05/2012.  Findings: There is mild motion artifact.  Postsurgical changes are stable status post amputation through the first metatarsal head and through the second middle phalanx.  There is increased deformity of the remaining second middle phalanx without gross bone destruction. Extensive neuroarthropathic changes are noted throughout the midfoot with osteophytes and mild subluxation. There is some soft tissue emphysema within the plantar aspect of the forefoot, best seen on the lateral view.  There may be additional soft tissue emphysema distal to the remaining first phalanx.  Extensive soft tissue calcifications are noted within the lower leg.  IMPRESSION:  1.  No radiographic evidence of recurrent osteomyelitis. 2.  Extensive neuro arthropathic changes. 3.  Suspected soft tissue emphysema within the plantar forefoot and adjacent to the first metatarsal.  This may represent soft tissue infection.   Original Report Authenticated By: Gabriel Howe, M.D.     Review of Systems  All other systems reviewed and are negative.   Blood pressure 113/61, pulse 95, temperature 99.6 F (37.6 C), temperature source Oral, resp. rate 17, height 6\' 8"  (2.032 m), weight 173 kg (381 lb 6.3 oz), SpO2 97.00%. Physical Exam On examination patient has a palpable dorsalis pedis pulse and posterior tibial pulse bilaterally. Examination of the left foot  he has ulcer beneath the great toe but this does have good granulation tissue and does not probe to bone. Examination of right foot he has a massive ulceration cellulitis and dermatitis of the right foot with a large ulcer which is approximately 6 x 9 cm on the plantar aspect of the right foot. This does probe to bone and I can palpate the  medial cuneiform. He also has an ulcer over the second toe. Review of the radiographs shows osteomyelitis of the second toe and shows retained first metatarsal. There is no definite lytic changes of the medial cuneiform on the radiographs. Patient is too large to go through the MRI scan of the hospital he weighs 380 pounds. 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 Wagner grade 1 ulcer left great toe.  Plan: Discussed that we could proceed with foot salvage surgery with resection of the second toe, amputation of the first metatarsal and the medial cuneiform with wound VAC and skin graft coverage to try foot salvage. Discussed with the patient's large size the it will be a struggle for this to heal and patient may potentially require further surgical intervention which may require a transtibial amputation. I feel the patient would be able to be back on his feet most quickly if we proceed with a transtibial amputation at this time. A long discussion with the patient discussed that I do not feel that there is a clear correct surgical intervention but surgery is necessary. I will talk with him on Saturday morning to further discuss surgical options.  DUDA,MARCUS V 12/18/2012, 6:00 PM

## 2012-12-18 NOTE — Progress Notes (Signed)
Inpatient Diabetes Program Recommendations  AACE/ADA: New Consensus Statement on Inpatient Glycemic Control (2013)  Target Ranges:  Prepandial:   less than 140 mg/dL      Peak postprandial:   less than 180 mg/dL (1-2 hours)      Critically ill patients:  140 - 180 mg/dL   Results for NAIN, RUDD (MRN [id number]) as of 12/18/2012 10:30  Ref. Range 12/08/2012 14:44 12/08/2012 16:45 12/18/2012 07:41  Glucose-Capillary Latest Range: 70-99 mg/dL 147 (H) 829 (H) 562 (H)      Note: Called patient over the phone and spoke with him about diabetes management at home.  According to the patient he was on Metformin before (not sure of the dose) and he stopped taking it due to cost and insurance issues.  He reports that he tolerated Metformin without any GI side effects.  I briefly discussed an A1C and patient reports that his last A1C was drawn about 2 years ago when he lived in [place] and he believes it was 8.5 or 8.7%.  An A1C has been ordered today but no results available yet.  Noted in progress note for today that Dr. Sunnie Nielsen may send patient home on Lantus depending on what A1C results show.  Patient was not aware that he could have gotten a month supply of Metformin at [facility] for $4.  Informed patient that [facility] offered several oral diabetic medications that he could purchase for $4 a month.  When deciding what to send patient home on for diabetes management, please be mindful of the cost since this is an issue for the patient.  He does have insurance but with some insurances, insulins may be a 50/50 medication in which the patient has to pay 50% of the cost and Lantus is over $200 a vial.  [facility] does offer Novolin 70/30 and Novolin NPH for $28 a vial which may be more affordable for the patient if insulin is ordered at time of discharge.  Will continue to follow.  Thanks, Orlando Penner, RN, BSN, CCRN Diabetes Coordinator Inpatient Diabetes Program [phone]

## 2012-12-19 ENCOUNTER — Other Ambulatory Visit: Payer: BC Managed Care – PPO

## 2012-12-19 DIAGNOSIS — E119 Type 2 diabetes mellitus without complications: Secondary | ICD-10-CM

## 2012-12-19 DIAGNOSIS — L97509 Non-pressure chronic ulcer of other part of unspecified foot with unspecified severity: Secondary | ICD-10-CM

## 2012-12-19 LAB — CBC
MCH: 24.9 pg — ABNORMAL LOW (ref 26.0–34.0)
MCV: 76.6 fL — ABNORMAL LOW (ref 78.0–100.0)
Platelets: 256 10*3/uL (ref 150–400)
RDW: 14.9 % (ref 11.5–15.5)

## 2012-12-19 LAB — GLUCOSE, CAPILLARY: Glucose-Capillary: 260 mg/dL — ABNORMAL HIGH (ref 70–99)

## 2012-12-19 LAB — BASIC METABOLIC PANEL
Calcium: 8.3 mg/dL — ABNORMAL LOW (ref 8.4–10.5)
Creatinine, Ser: 0.69 mg/dL (ref 0.50–1.35)
GFR calc Af Amer: >90 mL/min (ref >90)
GFR calc non Af Amer: >90 mL/min (ref >90)

## 2012-12-19 MED ORDER — "BD GETTING STARTED TAKE HOME KIT: 1ML X 30 G SYRINGES, "
1.0000 | Freq: Once | Status: AC
  Administered 2012-12-19: 1
  Filled 2012-12-19: qty 1

## 2012-12-19 MED ORDER — VANCOMYCIN HCL 10 G IV SOLR
2000.0000 mg | Freq: Two times a day (BID) | INTRAVENOUS | Status: DC
  Administered 2012-12-19 – 2012-12-21 (×4): 2000 mg via INTRAVENOUS
  Filled 2012-12-19 (×5): qty 2000

## 2012-12-19 MED ORDER — ENOXAPARIN SODIUM 100 MG/ML ~~LOC~~ SOLN
90.0000 mg | SUBCUTANEOUS | Status: DC
  Administered 2012-12-20 – 2012-12-21 (×2): 90 mg via SUBCUTANEOUS
  Filled 2012-12-19 (×2): qty 1

## 2012-12-19 MED ORDER — INSULIN GLARGINE 100 UNIT/ML ~~LOC~~ SOLN
20.0000 [IU] | Freq: Every day | SUBCUTANEOUS | Status: DC
  Administered 2012-12-19 – 2012-12-20 (×2): 20 [IU] via SUBCUTANEOUS

## 2012-12-19 MED ORDER — LIVING WELL WITH DIABETES BOOK
Freq: Once | Status: AC
  Administered 2012-12-19: 17:00:00
  Filled 2012-12-19: qty 1

## 2012-12-19 MED ORDER — POTASSIUM CHLORIDE CRYS ER 20 MEQ PO TBCR
40.0000 meq | EXTENDED_RELEASE_TABLET | Freq: Once | ORAL | Status: AC
  Administered 2012-12-19: 40 meq via ORAL
  Filled 2012-12-19: qty 2

## 2012-12-19 NOTE — Plan of Care (Signed)
Problem: Phase I Progression Outcomes Goal: Hemodynamically stable Outcome: Not Met (add Reason) Low grade temp of 100.1

## 2012-12-19 NOTE — Progress Notes (Signed)
Vancomycin Consult  Please see earlier note by Gwen Her, Pharm.D for more details.  Vancomycin trough returns as 6 (low, goal is 15-20) on Vancomycin 1500 mg IV q12h  Plan: Increase Vancomycin to 2gm IV q12h. Would repeat Vancomycin trough at steady state.   Geoffry Paradise, PharmD, BCPS Pager: [phone] 9:54 PM Pharmacy #: [phone]

## 2012-12-19 NOTE — Progress Notes (Signed)
Patient provided with informations about diabetes education videos available, encouraged to watch. States does not feel ready to at this time but will try to watch this evening.

## 2012-12-19 NOTE — Progress Notes (Signed)
Patient educated about insulin administration, common side effects and best site selection for injection. Discussed hypoglycemia and treatment, when to check blood sugars and how to determine amount of insulin to administer. Patient provided with hand outs with information.  Patient observed RN draw up and administer insulin and will practice this evening at dinner time.

## 2012-12-19 NOTE — Progress Notes (Signed)
Cm spoke extensively with patient concerning discharge planning. Pt provided with contact information for Social Security Office in [place] to apply for Disability. Pt provided with co-pay information for Lantus, 45.00. Pt instructed to purchase 10.00 glucometer from [facility]. Pt provided with Lantus drug information. Pt states being able to check own blood sugar. Per pt would like to go home prior to having surgery. No other needs stated at this time.   Leonie Green [phone] CASE MANAGER

## 2012-12-19 NOTE — Care Management Note (Signed)
Cm spoke to patient concerning post surgical discharge planning if pt consents to surgery. Cm explained to pt upon surgery, Physical therapy to evaluate ambulatory level. Pt explained depending on evaluation planning for Home Health vs SNF would take place. Pt has medication cost concerns, CMA to provide information concerning co-pay. Pt advised to contact Social Security Office for questions concerning applying for disability. Will continue to monitor.   Leonie Green Case Manager [phone]

## 2012-12-19 NOTE — Progress Notes (Signed)
ANTIBIOTIC CONSULT NOTE - FOLLOW UP  Pharmacy Consult for Vancomycin Indication: R foot infx, r/o osteomyelitis  Allergies  Allergen Reactions  . Bactroban (Mupirocin Calcium) Hives    Patient Measurements: Height: 6\' 8"  (203.2 cm) Weight: 381 lb 6.3 oz (173 kg) IBW/kg (Calculated) : 96  Vital Signs: Temp: 98.8 F (37.1 C) (02/21 0600) Temp src: Oral (02/21 0600) BP: 125/73 mmHg (02/21 0600) Pulse Rate: 83 (02/21 0600) Intake/Output from previous day: 02/20 0701 - 02/21 0700 In: 3728.3 [P.O.:720; I.V.:2408.3; IV Piggyback:600] Out: -  Intake/Output from this shift:    Labs:  Recent Labs  12/17/12 2236 12/19/12 0348  WBC 19.0* 10.1  HGB 12.4* 11.2*  PLT 257 256  CREATININE 0.96 0.69   Estimated Creatinine Clearance: 198.1 ml/min (by C-G formula based on Cr of 0.69). No results found for this basename: VANCOTROUGH, Leodis Binet, VANCORANDOM, GENTTROUGH, GENTPEAK, GENTRANDOM, TOBRATROUGH, TOBRAPEAK, TOBRARND, AMIKACINPEAK, AMIKACINTROU, AMIKACIN,  in the last 72 hours   Microbiology: Recent Results (from the past 720 hour(s))  CULTURE, BLOOD (ROUTINE X 2)     Status: None   Collection Time    12/18/12 12:20 AM      Result Value Range Status   Specimen Description BLOOD RIGHT HAND   Final   Special Requests BOTTLES DRAWN AEROBIC AND ANAEROBIC 5CC   Final   Culture  Setup Time 12/18/2012 03:54   Final   Culture     Final   Value:        BLOOD CULTURE RECEIVED NO GROWTH TO DATE CULTURE WILL BE HELD FOR 5 DAYS BEFORE ISSUING A FINAL NEGATIVE REPORT   Report Status PENDING   Incomplete  CULTURE, BLOOD (ROUTINE X 2)     Status: None   Collection Time    12/18/12 12:25 AM      Result Value Range Status   Specimen Description BLOOD RIGHT ARM   Final   Special Requests BOTTLES DRAWN AEROBIC AND ANAEROBIC 5CC   Final   Culture  Setup Time 12/18/2012 03:54   Final   Culture     Final   Value:        BLOOD CULTURE RECEIVED NO GROWTH TO DATE CULTURE WILL BE HELD FOR 5 DAYS  BEFORE ISSUING A FINAL NEGATIVE REPORT   Report Status PENDING   Incomplete    Anti-infectives   Start     Dose/Rate Route Frequency Ordered Stop   12/18/12 0800  vancomycin (VANCOCIN) 1,500 mg in sodium chloride 0.9 % 500 mL IVPB     1,500 mg 250 mL/hr over 120 Minutes Intravenous Every 12 hours 12/18/12 0227     12/18/12 0600  piperacillin-tazobactam (ZOSYN) IVPB 3.375 g     3.375 g 12.5 mL/hr over 240 Minutes Intravenous 3 times per day 12/18/12 0140     12/17/12 2300  vancomycin (VANCOCIN) IVPB 1000 mg/200 mL premix     1,000 mg 200 mL/hr over 60 Minutes Intravenous  Once 12/17/12 2257 12/18/12 0117   12/17/12 2300  piperacillin-tazobactam (ZOSYN) IVPB 3.375 g     3.375 g 100 mL/hr over 30 Minutes Intravenous  Once 12/17/12 2257 12/18/12 0028      Assessment: 51 YOM w/ worsening DM R foot ulcer - large, foul smelling, follows up at wound care center. On Doxy PTA. Foot xray shows no evidence of osteo. Body habitus impeding MRI eval for r/o osteo. Seen by ortho, salvage surgery vs transtibial amputation pending  Day # 2 Vanc 1500mg  IV q12h and Zosyn 3.375g IV q8h  Scr wnl, CrCl > 156ml/min  Tm 100.1, WBC down 19 > 10.1  Blood Cx x 2 = NGTD   Goal of Therapy:  Vancomycin trough level 15-20 mcg/ml  Plan:  Continue current Vanc dose VT tonight  Gwen Her PharmD  [phone] 12/19/2012 8:41 AM

## 2012-12-19 NOTE — Progress Notes (Signed)
[facility] PROGRESS NOTE  Gabriel Howe RUE:[id number] DOB: 1961/02/17 DOA: 12/17/2012 PCP: Vickey Sages, MD  Assessment/Plan: 1-Right foot ulcer, diabetic foot: Patient exceed weight limit for MRI machine. Continue with antibiotics, Vancomycin and Zosyn day 2.  Dr Lajoyce Corners recommend transtibial amputation vs foot salvage surgery ( resection of the second toe, amputation of the first metatarsal and the medial cuneiform with wound VAC and skin graft coverage to try foot salvage) further discussion with Dr Lajoyce Corners 2-22.   2-Diabetes: Was not taking metformin. No taking medications due to financial reasons. HbA-1c at 12. Diabetes educator consulted. Will increase Lantus to 20 units. CBG 175 to 236. Resume metformin at discharge. 3-Hyponatremia: Improved  with IV fluids and correction of hyperglycemia.  4-Hypokalemia: 40 meq time 1.    Code Status: Full Code. Family Communication: Care discussed with patient.  Disposition Plan: To be determine.   Consultants:  Dr Lajoyce Corners.  Procedures:  none  Antibiotics:  Vancomycin 2-20  Zosyn 2-20  HPI/Subjective: Feeling ok. He has multiple questions for Dr Lajoyce Corners. He is wondering if he can be discharge and have surgery next week. He needs to do multiples arrangement. He is also worry that he wont get pay. He has concern for care after surgery. I ask CM to help with post op care education regarding disposition. Also CM to help with medications cost, co pay information.   Objective: Filed Vitals:   12/18/12 2045 12/18/12 2152 12/19/12 0600 12/19/12 1329  BP:  121/71 125/73 138/91  Pulse: 90 89 83 77  Temp:  100.1 F (37.8 C) 98.8 F (37.1 C) 98.1 F (36.7 C)  TempSrc:  Oral Oral Oral  Resp:  18 16 16   Height:      Weight:      SpO2:  98% 96% 99%    Intake/Output Summary (Last 24 hours) at 12/19/12 1347 Last data filed at 12/19/12 1330  Gross per 24 hour  Intake 4208.34 ml  Output      0 ml  Net 4208.34 ml   Filed Weights   12/18/12 0143  Weight: 173 kg (381 lb 6.3 oz)    Exam:   General:  No distress.  Cardiovascular: S 1, S 2 RRR  Respiratory: CTA  Abdomen: Bs present, soft, NT  Extremities; right foot with dressing.   Data Reviewed: Basic Metabolic Panel:  Recent Labs Lab 12/17/12 2236 12/19/12 0348  NA 126* 130*  K 4.3 3.4*  CL 90* 95*  CO2 23 24  GLUCOSE 334* 176*  BUN 11 8  CREATININE 0.96 0.69  CALCIUM 8.9 8.3*   Liver Function Tests: No results found for this basename: AST, ALT, ALKPHOS, BILITOT, PROT, ALBUMIN,  in the last 168 hours No results found for this basename: LIPASE, AMYLASE,  in the last 168 hours No results found for this basename: AMMONIA,  in the last 168 hours CBC:  Recent Labs Lab 12/17/12 2236 12/19/12 0348  WBC 19.0* 10.1  NEUTROABS 17.1*  --   HGB 12.4* 11.2*  HCT 38.4* 34.4*  MCV 75.7* 76.6*  PLT 257 256   Cardiac Enzymes: No results found for this basename: CKTOTAL, CKMB, CKMBINDEX, TROPONINI,  in the last 168 hours BNP (last 3 results) No results found for this basename: PROBNP,  in the last 8760 hours CBG:  Recent Labs Lab 12/18/12 1157 12/18/12 1705 12/18/12 2147 12/19/12 0801 12/19/12 1230  GLUCAP 264* 190* 195* 175* 236*    Recent Results (from the past 240 hour(s))  CULTURE, BLOOD (ROUTINE X  2)     Status: None   Collection Time    26-Dec-2012 12:20 AM      Result Value Range Status   Specimen Description BLOOD RIGHT HAND   Final   Special Requests BOTTLES DRAWN AEROBIC AND ANAEROBIC 5CC   Final   Culture  Setup Time December 26, 2012 03:54   Final   Culture     Final   Value:        BLOOD CULTURE RECEIVED NO GROWTH TO DATE CULTURE WILL BE HELD FOR 5 DAYS BEFORE ISSUING A FINAL NEGATIVE REPORT   Report Status PENDING   Incomplete  CULTURE, BLOOD (ROUTINE X 2)     Status: None   Collection Time    12/26/2012 12:25 AM      Result Value Range Status   Specimen Description BLOOD RIGHT ARM   Final   Special Requests BOTTLES DRAWN AEROBIC  AND ANAEROBIC 5CC   Final   Culture  Setup Time December 26, 2012 03:54   Final   Culture     Final   Value:        BLOOD CULTURE RECEIVED NO GROWTH TO DATE CULTURE WILL BE HELD FOR 5 DAYS BEFORE ISSUING A FINAL NEGATIVE REPORT   Report Status PENDING   Incomplete     Studies: Dg Foot Complete Right  Dec 26, 2012  *RADIOLOGY REPORT*  Clinical Data: Diabetic with nonhealing foot wound.  RIGHT FOOT COMPLETE - 3+ VIEW  Comparison: Toe radiographs 09/05/2012.  Findings: There is mild motion artifact.  Postsurgical changes are stable status post amputation through the first metatarsal head and through the second middle phalanx.  There is increased deformity of the remaining second middle phalanx without gross bone destruction. Extensive neuroarthropathic changes are noted throughout the midfoot with osteophytes and mild subluxation. There is some soft tissue emphysema within the plantar aspect of the forefoot, best seen on the lateral view.  There may be additional soft tissue emphysema distal to the remaining first phalanx.  Extensive soft tissue calcifications are noted within the lower leg.  IMPRESSION:  1.  No radiographic evidence of recurrent osteomyelitis. 2.  Extensive neuro arthropathic changes. 3.  Suspected soft tissue emphysema within the plantar forefoot and adjacent to the first metatarsal.  This may represent soft tissue infection.   Original Report Authenticated By: Carey Bullocks, M.D.     Scheduled Meds: . docusate sodium  100 mg Oral BID  . [START ON 12/20/2012] enoxaparin (LOVENOX) injection  90 mg Subcutaneous Q24H  . insulin aspart  0-15 Units Subcutaneous TID WC  . insulin glargine  15 Units Subcutaneous QHS  . piperacillin-tazobactam  3.375 g Intravenous Q8H  . vancomycin  1,500 mg Intravenous Q12H   Continuous Infusions: . sodium chloride 1,000 mL (26-Dec-2012 2050)    Principal Problem:   Cellulitis and abscess of lower leg Active Problems:   DM foot ulcer   DM2 (diabetes  mellitus, type 2)   Diabetic neuropathy    Time spent: 25 minutes.     Verania Salberg  [facility] Pager [phone]. If 8PM-8AM, please contact night-coverage at [email or web address], password [facility] 12/19/2012, 1:47 PM  LOS: 2 days

## 2012-12-19 NOTE — Progress Notes (Signed)
Inpatient Diabetes Program Recommendations  AACE/ADA: New Consensus Statement on Inpatient Glycemic Control (2013)  Target Ranges:  Prepandial:   less than 140 mg/dL      Peak postprandial:   less than 180 mg/dL (1-2 hours)      Critically ill patients:  140 - 180 mg/dL   Reason for Visit: Hyperglycemia  Demonstrated insulin pen use with pt.  States he would rather be discharged on pen, rather than syringe.  Will also need meter to check blood sugars.  Recommended ReliOn meter at [facility] since strips are less expensive.  Pt states he just quit taking metformin for no particular reason.  Discussed importance of glycemic control in order to heal.    Results for VIVIANO, BIR (MRN [id number]) as of 12/19/2012 15:55  Ref. Range 12/18/2012 11:57 12/18/2012 17:05 12/18/2012 21:47 12/19/2012 08:01 12/19/2012 12:30  Glucose-Capillary Latest Range: 70-99 mg/dL 409 (H) 811 (H) 914 (H) 175 (H) 236 (H)  Results for KEIN, CARLBERG (MRN [id number]) as of 12/19/2012 15:55  Ref. Range 12/17/2012 22:36  Hemoglobin A1C Latest Range: <5.7 % 12.5 (H)   Recommendations:  Pt needs meal coverage insulin.  FBS was improved with Lantus, but post-prandial blood sugars are still elevated.  Consider adding Novolog 5 units tidwc for meal coverage insulin. Will order Living Well With Diabetes book and pt instructed to view diabetes videos on pt ed channel. May consider 70/30 (generic) insulin at discharge since finances are an issue. Would recommend 70/30 15 units bid.  Will need close follow up for insulin titration.  Thank you.  Ailene Ards, RD, LDN, CDE Inpatient Diabetes Coordinator [phone]

## 2012-12-19 NOTE — Progress Notes (Signed)
Pharmacy may adjust Lovenox dose   Allergies  Allergen Reactions  . Bactroban (Mupirocin Calcium) Hives    Patient Measurements: Height: 6\' 8"  (203.2 cm) Weight: 381 lb 6.3 oz (173 kg) IBW/kg (Calculated) : 96  Labs:  Recent Labs  12/17/12 2236 12/19/12 0348  HGB 12.4* 11.2*  HCT 38.4* 34.4*  PLT 257 256  CREATININE 0.96 0.69    Estimated Creatinine Clearance: 198.1 ml/min (by C-G formula based on Cr of 0.69).   Medical History: Past Medical History  Diagnosis Date  . Diabetes mellitus without complication     Medications:  Scheduled:  . docusate sodium  100 mg Oral BID  . [START ON 12/20/2012] enoxaparin (LOVENOX) injection  90 mg Subcutaneous Q24H  . insulin aspart  0-15 Units Subcutaneous TID WC  . insulin glargine  15 Units Subcutaneous QHS  . piperacillin-tazobactam  3.375 g Intravenous Q8H  . vancomycin  1,500 mg Intravenous Q12H  . [DISCONTINUED] enoxaparin (LOVENOX) injection  40 mg Subcutaneous Q24H   A/P Pt ordered 40mg  Lovenox daily for VTE ppx, Pharmacy may adjust. Dose increased to 90mg  sq q24h based on wt 173kg (0.5mg /kg).   Gwen Her PharmD  [phone] 12/19/2012 11:08 AM

## 2012-12-19 NOTE — Progress Notes (Signed)
Patient demonstrated giving himself an insulin injection after drawing the insulin from a vial. Patient had difficulty with seeing the small hash marks on the syringe. Sates would prefer an insulin pen.

## 2012-12-20 DIAGNOSIS — E1149 Type 2 diabetes mellitus with other diabetic neurological complication: Secondary | ICD-10-CM

## 2012-12-20 DIAGNOSIS — E1142 Type 2 diabetes mellitus with diabetic polyneuropathy: Secondary | ICD-10-CM

## 2012-12-20 DIAGNOSIS — L02419 Cutaneous abscess of limb, unspecified: Secondary | ICD-10-CM

## 2012-12-20 DIAGNOSIS — L03119 Cellulitis of unspecified part of limb: Secondary | ICD-10-CM

## 2012-12-20 LAB — BASIC METABOLIC PANEL
BUN: 7 mg/dL (ref 6–23)
CO2: 25 mEq/L (ref 19–32)
Chloride: 101 mEq/L (ref 96–112)
Creatinine, Ser: 0.67 mg/dL (ref 0.50–1.35)
Glucose, Bld: 199 mg/dL — ABNORMAL HIGH (ref 70–99)

## 2012-12-20 LAB — GLUCOSE, CAPILLARY
Glucose-Capillary: 205 mg/dL — ABNORMAL HIGH (ref 70–99)
Glucose-Capillary: 249 mg/dL — ABNORMAL HIGH (ref 70–99)
Glucose-Capillary: 223 mg/dL — ABNORMAL HIGH (ref 70–99)

## 2012-12-20 MED ORDER — INSULIN ASPART 100 UNIT/ML ~~LOC~~ SOLN
4.0000 [IU] | Freq: Three times a day (TID) | SUBCUTANEOUS | Status: DC
  Administered 2012-12-20 – 2012-12-21 (×4): 4 [IU] via SUBCUTANEOUS

## 2012-12-20 NOTE — Progress Notes (Signed)
Patient ID: Gabriel Howe, male   DOB: [birth year], 52 y.o.   MRN: [id number] Cellulitis right foot improving on the IV antibiotics. After long discussion patient agrees to proceed with a transtibial amputation. I feel it is safe for patient to be discharged to home either today or tomorrow.  Patient will call my office on Monday to schedule surgery. Anticipate surgery on March 5.

## 2012-12-20 NOTE — Progress Notes (Signed)
[facility] PROGRESS NOTE  Gabriel Howe ZOX:[id number] DOB: 07/12/1961 DOA: 12/17/2012 PCP: Vickey Sages, MD  Assessment/Plan: 1-Right foot ulcer, diabetic foot: Patient exceed weight limit for MRI machine. Continue with antibiotics, Vancomycin and Zosyn Gabriel 3.  Dr Lajoyce Corners recommend transtibial amputation vs foot salvage surgery ( resection of the second toe, amputation of the first metatarsal and the medial cuneiform with wound VAC and skin graft coverage to try foot salvage) further discussion with Dr Lajoyce Corners 2-22.  Patient is agree with surgery transtibial but would like to go home first to resolved personal issues. Patient to speak with Dr Lajoyce Corners today.  I will continue with IV antibiotics.   2-Diabetes: Was not taking metformin. No taking medications due to financial reasons. HbA-1c at 12. Diabetes educator consulted. Will increase Lantus to 20 units. CBG 175 to 236. Resume metformin at discharge. Will add meal coverage.  3-Hyponatremia: Improved  with IV fluids and correction of hyperglycemia.  4-Hypokalemia: resolved.    Code Status: Full Code. Family Communication: Care discussed with patient.  Disposition Plan: To be determine.   Consultants:  Dr Lajoyce Corners.  Procedures:  none  Antibiotics:  Vancomycin 2-20  Zosyn 2-20  HPI/Subjective: Feeling good today. He has decide to have surgery. Want to speak with Dr Lajoyce Corners, he wants to go home first.   Objective: Filed Vitals:   12/19/12 0600 12/19/12 1329 12/19/12 2123 12/20/12 0435  BP: 125/73 138/91 128/81 107/61  Pulse: 83 77 81 72  Temp: 98.8 F (37.1 C) 98.1 F (36.7 C) 99.3 F (37.4 C) 98.2 F (36.8 C)  TempSrc: Oral Oral Oral Oral  Resp: 16 16 18 16   Height:      Weight:      SpO2: 96% 99% 99% 98%    Intake/Output Summary (Last 24 hours) at 12/20/12 1150 Last data filed at 12/20/12 0945  Gross per 24 hour  Intake   3875 ml  Output      0 ml  Net   3875 ml   Filed Weights   12/18/12 0143  Weight: 173 kg  (381 lb 6.3 oz)    Exam:   General:  No distress.  Cardiovascular: S 1, S 2 RRR  Respiratory: CTA  Abdomen: Bs present, soft, NT  Extremities; right foot with dressing.   Data Reviewed: Basic Metabolic Panel:  Recent Labs Lab 12/17/12 2236 12/19/12 0348 12/20/12 0413  NA 126* 130* 133*  K 4.3 3.4* 3.6  CL 90* 95* 101  CO2 23 24 25   GLUCOSE 334* 176* 199*  BUN 11 8 7   CREATININE 0.96 0.69 0.67  CALCIUM 8.9 8.3* 8.2*   Liver Function Tests: No results found for this basename: AST, ALT, ALKPHOS, BILITOT, PROT, ALBUMIN,  in the last 168 hours No results found for this basename: LIPASE, AMYLASE,  in the last 168 hours No results found for this basename: AMMONIA,  in the last 168 hours CBC:  Recent Labs Lab 12/17/12 2236 12/19/12 0348  WBC 19.0* 10.1  NEUTROABS 17.1*  --   HGB 12.4* 11.2*  HCT 38.4* 34.4*  MCV 75.7* 76.6*  PLT 257 256   Cardiac Enzymes: No results found for this basename: CKTOTAL, CKMB, CKMBINDEX, TROPONINI,  in the last 168 hours BNP (last 3 results) No results found for this basename: PROBNP,  in the last 8760 hours CBG:  Recent Labs Lab 12/19/12 0801 12/19/12 1230 12/19/12 1658 12/19/12 2128 12/20/12 0812  GLUCAP 175* 236* 260* 188* 205*    Recent Results (from the past  240 hour(s))  CULTURE, BLOOD (ROUTINE X 2)     Status: None   Collection Time    12/18/12 12:20 AM      Result Value Range Status   Specimen Description BLOOD RIGHT HAND   Final   Special Requests BOTTLES DRAWN AEROBIC AND ANAEROBIC 5CC   Final   Culture  Setup Time 12/18/2012 03:54   Final   Culture     Final   Value:        BLOOD CULTURE RECEIVED NO GROWTH TO DATE CULTURE WILL BE HELD FOR 5 DAYS BEFORE ISSUING A FINAL NEGATIVE REPORT   Report Status PENDING   Incomplete  CULTURE, BLOOD (ROUTINE X 2)     Status: None   Collection Time    12/18/12 12:25 AM      Result Value Range Status   Specimen Description BLOOD RIGHT ARM   Final   Special Requests  BOTTLES DRAWN AEROBIC AND ANAEROBIC 5CC   Final   Culture  Setup Time 12/18/2012 03:54   Final   Culture     Final   Value:        BLOOD CULTURE RECEIVED NO GROWTH TO DATE CULTURE WILL BE HELD FOR 5 DAYS BEFORE ISSUING A FINAL NEGATIVE REPORT   Report Status PENDING   Incomplete     Studies: No results found.  Scheduled Meds: . docusate sodium  100 mg Oral BID  . enoxaparin (LOVENOX) injection  90 mg Subcutaneous Q24H  . insulin aspart  0-15 Units Subcutaneous TID WC  . insulin glargine  20 Units Subcutaneous QHS  . piperacillin-tazobactam  3.375 g Intravenous Q8H  . vancomycin  2,000 mg Intravenous Q12H   Continuous Infusions: . sodium chloride 100 mL/hr at 12/20/12 1191    Principal Problem:   Cellulitis and abscess of lower leg Active Problems:   DM foot ulcer   DM2 (diabetes mellitus, type 2)   Diabetic neuropathy    Time spent: 25 minutes.     Gabriel Howe  [facility] Pager [phone]. If 8PM-8AM, please contact night-coverage at [email or web address], password [facility] 12/20/2012, 11:50 AM  LOS: 3 days

## 2012-12-21 LAB — GLUCOSE, CAPILLARY: Glucose-Capillary: 178 mg/dL — ABNORMAL HIGH (ref 70–99)

## 2012-12-21 MED ORDER — INSULIN ASPART 100 UNIT/ML ~~LOC~~ SOLN: 4.0000 [IU] | Freq: Three times a day (TID) | SUBCUTANEOUS | Status: DC

## 2012-12-21 MED ORDER — INSULIN GLARGINE 100 UNIT/ML ~~LOC~~ SOLN: 20.0000 [IU] | Freq: Every day | SUBCUTANEOUS | Status: DC

## 2012-12-21 MED ORDER — DOXYCYCLINE HYCLATE 100 MG PO TABS: 100.0000 mg | ORAL_TABLET | Freq: Two times a day (BID) | ORAL | Status: DC

## 2012-12-21 MED ORDER — CIPROFLOXACIN HCL 500 MG PO TABS: 500.0000 mg | ORAL_TABLET | Freq: Two times a day (BID) | ORAL | Status: DC
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
## 2012-12-21 NOTE — Progress Notes (Signed)
ANTIBIOTIC CONSULT NOTE - BRIEF FOLLOW UP NOTE   Pharmacy Consult for Vancomycin   Day #4 vancomycin and zosyn for DM R foot ulcer, r/o osteomyelitis. Vancomycin trough 12.3 this AM on 2g IV q12h dosing. Plan is for patient to be discharged home today to resolve personal issues then will be readmitted next week for transtibial amputation.  If vancomycin is to be resumed prior to amputation, would recommend q8h dosing schedule for trough goal 15-20 considering concern for osteomyelitis.  Clance Boll, PharmD, BCPS Pager: [phone] 12/21/2012 11:31 AM

## 2012-12-22 ENCOUNTER — Other Ambulatory Visit (HOSPITAL_COMMUNITY): Payer: Self-pay | Admitting: Orthopedic Surgery

## 2012-12-23 ENCOUNTER — Encounter (HOSPITAL_COMMUNITY): Payer: Self-pay | Admitting: Pharmacy Technician

## 2012-12-24 LAB — CULTURE, BLOOD (ROUTINE X 2)

## 2012-12-27 HISTORY — PX: BELOW KNEE LEG AMPUTATION: SUR23

## 2012-12-29 ENCOUNTER — Encounter (HOSPITAL_BASED_OUTPATIENT_CLINIC_OR_DEPARTMENT_OTHER): Payer: BC Managed Care – PPO

## 2013-01-01 ENCOUNTER — Encounter (HOSPITAL_COMMUNITY): Payer: Self-pay

## 2013-01-01 MED ORDER — CEFAZOLIN SODIUM-DEXTROSE 2-3 GM-% IV SOLR
2.0000 g | INTRAVENOUS | Status: AC
  Administered 2013-01-02: 2 g via INTRAVENOUS
  Filled 2013-01-01: qty 50

## 2013-01-02 ENCOUNTER — Encounter (HOSPITAL_COMMUNITY): Payer: Self-pay | Admitting: Anesthesiology

## 2013-01-02 ENCOUNTER — Encounter (HOSPITAL_COMMUNITY)
Admission: RE | Disposition: A | Discharge status: Home Health | Payer: Self-pay | Source: Ambulatory Visit | Attending: Orthopedic Surgery

## 2013-01-02 ENCOUNTER — Inpatient Hospital Stay (HOSPITAL_COMMUNITY): Payer: BC Managed Care – PPO | Admitting: Anesthesiology

## 2013-01-02 ENCOUNTER — Encounter (HOSPITAL_COMMUNITY): Payer: Self-pay | Admitting: *Deleted

## 2013-01-02 DIAGNOSIS — E119 Type 2 diabetes mellitus without complications: Secondary | ICD-10-CM

## 2013-01-02 DIAGNOSIS — I739 Peripheral vascular disease, unspecified: Secondary | ICD-10-CM | POA: Diagnosis present

## 2013-01-02 DIAGNOSIS — J4489 Other specified chronic obstructive pulmonary disease: Secondary | ICD-10-CM | POA: Diagnosis present

## 2013-01-02 DIAGNOSIS — L02619 Cutaneous abscess of unspecified foot: Secondary | ICD-10-CM | POA: Diagnosis present

## 2013-01-02 DIAGNOSIS — M9080 Osteopathy in diseases classified elsewhere, unspecified site: Secondary | ICD-10-CM | POA: Diagnosis present

## 2013-01-02 DIAGNOSIS — L02419 Cutaneous abscess of limb, unspecified: Secondary | ICD-10-CM

## 2013-01-02 DIAGNOSIS — E11621 Type 2 diabetes mellitus with foot ulcer: Secondary | ICD-10-CM

## 2013-01-02 DIAGNOSIS — J449 Chronic obstructive pulmonary disease, unspecified: Secondary | ICD-10-CM | POA: Diagnosis present

## 2013-01-02 DIAGNOSIS — E1169 Type 2 diabetes mellitus with other specified complication: Principal | ICD-10-CM | POA: Diagnosis present

## 2013-01-02 DIAGNOSIS — M869 Osteomyelitis, unspecified: Secondary | ICD-10-CM | POA: Diagnosis present

## 2013-01-02 DIAGNOSIS — E1149 Type 2 diabetes mellitus with other diabetic neurological complication: Secondary | ICD-10-CM | POA: Diagnosis present

## 2013-01-02 DIAGNOSIS — M129 Arthropathy, unspecified: Secondary | ICD-10-CM | POA: Diagnosis present

## 2013-01-02 DIAGNOSIS — L03119 Cellulitis of unspecified part of limb: Secondary | ICD-10-CM

## 2013-01-02 DIAGNOSIS — L97409 Non-pressure chronic ulcer of unspecified heel and midfoot with unspecified severity: Secondary | ICD-10-CM | POA: Diagnosis present

## 2013-01-02 HISTORY — DX: Cardiac murmur, unspecified: R01.1

## 2013-01-02 HISTORY — PX: AMPUTATION: SHX166

## 2013-01-02 HISTORY — DX: Bronchitis, not specified as acute or chronic: J40

## 2013-01-02 HISTORY — DX: Unspecified osteoarthritis, unspecified site: M19.90

## 2013-01-02 HISTORY — DX: Myoneural disorder, unspecified: G70.9

## 2013-01-02 SURGERY — AMPUTATION BELOW KNEE
Anesthesia: General | Laterality: Right | Wound class: Clean

## 2013-01-02 MED ORDER — METHOCARBAMOL 500 MG PO TABS
500.0000 mg | ORAL_TABLET | Freq: Four times a day (QID) | ORAL | Status: DC | PRN
  Administered 2013-01-02 – 2013-01-04 (×4): 500 mg via ORAL
  Filled 2013-01-02 (×3): qty 1

## 2013-01-02 MED ORDER — OXYCODONE-ACETAMINOPHEN 5-325 MG PO TABS
1.0000 | ORAL_TABLET | ORAL | Status: DC | PRN
  Administered 2013-01-02 – 2013-01-05 (×7): 2 via ORAL
  Filled 2013-01-02 (×7): qty 2

## 2013-01-02 MED ORDER — HYDROCODONE-ACETAMINOPHEN 5-325 MG PO TABS: 1.0000 | ORAL_TABLET | ORAL | Status: DC | PRN

## 2013-01-02 MED ORDER — MIDAZOLAM HCL 2 MG/2ML IJ SOLN: 1.0000 mg | INTRAMUSCULAR | Status: DC | PRN

## 2013-01-02 MED ORDER — METHOCARBAMOL 100 MG/ML IJ SOLN: 500.0000 mg | Freq: Four times a day (QID) | INTRAVENOUS | Status: DC | PRN

## 2013-01-02 MED ORDER — HYDROMORPHONE HCL PF 1 MG/ML IJ SOLN
INTRAMUSCULAR | Status: AC
  Filled 2013-01-02: qty 2

## 2013-01-02 MED ORDER — PROPOFOL 10 MG/ML IV BOLUS
INTRAVENOUS | Status: DC | PRN
  Administered 2013-01-02: 200 mg via INTRAVENOUS

## 2013-01-02 MED ORDER — WARFARIN VIDEO: Freq: Once | Status: DC

## 2013-01-02 MED ORDER — LIDOCAINE HCL (CARDIAC) 20 MG/ML IV SOLN
INTRAVENOUS | Status: DC | PRN
  Administered 2013-01-02: 100 mg via INTRAVENOUS

## 2013-01-02 MED ORDER — ONDANSETRON HCL 4 MG/2ML IJ SOLN: 4.0000 mg | Freq: Four times a day (QID) | INTRAMUSCULAR | Status: DC | PRN

## 2013-01-02 MED ORDER — GABAPENTIN 300 MG PO CAPS
300.0000 mg | ORAL_CAPSULE | Freq: Three times a day (TID) | ORAL | Status: DC
  Administered 2013-01-02 – 2013-01-05 (×9): 300 mg via ORAL
  Filled 2013-01-02 (×11): qty 1

## 2013-01-02 MED ORDER — WARFARIN - PHARMACIST DOSING INPATIENT: Freq: Every day | Status: DC

## 2013-01-02 MED ORDER — ONDANSETRON HCL 4 MG/2ML IJ SOLN
INTRAMUSCULAR | Status: DC | PRN
  Administered 2013-01-02: 4 mg via INTRAVENOUS

## 2013-01-02 MED ORDER — ONDANSETRON HCL 4 MG PO TABS: 4.0000 mg | ORAL_TABLET | Freq: Four times a day (QID) | ORAL | Status: DC | PRN

## 2013-01-02 MED ORDER — HYDROMORPHONE HCL PF 1 MG/ML IJ SOLN
0.2500 mg | INTRAMUSCULAR | Status: DC | PRN
  Administered 2013-01-02 (×4): 0.5 mg via INTRAVENOUS

## 2013-01-02 MED ORDER — HYDROMORPHONE HCL PF 1 MG/ML IJ SOLN
INTRAMUSCULAR | Status: DC | PRN
  Administered 2013-01-02 (×2): 0.5 mg via INTRAVENOUS

## 2013-01-02 MED ORDER — PROMETHAZINE HCL 25 MG/ML IJ SOLN: 6.2500 mg | INTRAMUSCULAR | Status: DC | PRN

## 2013-01-02 MED ORDER — HYDROMORPHONE HCL PF 1 MG/ML IJ SOLN
0.5000 mg | INTRAMUSCULAR | Status: DC | PRN
  Administered 2013-01-02 – 2013-01-04 (×4): 1 mg via INTRAVENOUS
  Filled 2013-01-02 (×5): qty 1

## 2013-01-02 MED ORDER — SODIUM CHLORIDE 0.9 % IV SOLN: INTRAVENOUS | Status: DC

## 2013-01-02 MED ORDER — CEFAZOLIN SODIUM-DEXTROSE 2-3 GM-% IV SOLR
2.0000 g | Freq: Four times a day (QID) | INTRAVENOUS | Status: AC
  Administered 2013-01-02 – 2013-01-03 (×3): 2 g via INTRAVENOUS
  Filled 2013-01-02 (×3): qty 50

## 2013-01-02 MED ORDER — INSULIN GLARGINE 100 UNIT/ML ~~LOC~~ SOLN
20.0000 [IU] | Freq: Every day | SUBCUTANEOUS | Status: DC
  Administered 2013-01-02 – 2013-01-04 (×3): 20 [IU] via SUBCUTANEOUS

## 2013-01-02 MED ORDER — METOCLOPRAMIDE HCL 10 MG PO TABS: 5.0000 mg | ORAL_TABLET | Freq: Three times a day (TID) | ORAL | Status: DC | PRN

## 2013-01-02 MED ORDER — DIAZEPAM 5 MG/ML IJ SOLN
INTRAMUSCULAR | Status: AC
  Administered 2013-01-02: 5 mg
  Filled 2013-01-02: qty 2

## 2013-01-02 MED ORDER — FENTANYL CITRATE 0.05 MG/ML IJ SOLN: 50.0000 ug | Freq: Once | INTRAMUSCULAR | Status: DC

## 2013-01-02 MED ORDER — FENTANYL CITRATE 0.05 MG/ML IJ SOLN
INTRAMUSCULAR | Status: DC | PRN
  Administered 2013-01-02 (×2): 50 ug via INTRAVENOUS
  Administered 2013-01-02: 100 ug via INTRAVENOUS
  Administered 2013-01-02 (×7): 50 ug via INTRAVENOUS

## 2013-01-02 MED ORDER — INSULIN ASPART 100 UNIT/ML ~~LOC~~ SOLN
4.0000 [IU] | Freq: Three times a day (TID) | SUBCUTANEOUS | Status: DC
  Administered 2013-01-02 – 2013-01-05 (×9): 4 [IU] via SUBCUTANEOUS

## 2013-01-02 MED ORDER — METOCLOPRAMIDE HCL 5 MG/ML IJ SOLN: 5.0000 mg | Freq: Three times a day (TID) | INTRAMUSCULAR | Status: DC | PRN

## 2013-01-02 MED ORDER — LACTATED RINGERS IV SOLN
INTRAVENOUS | Status: DC | PRN
  Administered 2013-01-02: 14:00:00 via INTRAVENOUS

## 2013-01-02 MED ORDER — METHOCARBAMOL 500 MG PO TABS
ORAL_TABLET | ORAL | Status: AC
  Filled 2013-01-02: qty 1

## 2013-01-02 MED ORDER — INSULIN ASPART 100 UNIT/ML ~~LOC~~ SOLN
0.0000 [IU] | Freq: Three times a day (TID) | SUBCUTANEOUS | Status: DC
  Administered 2013-01-02: 3 [IU] via SUBCUTANEOUS
  Administered 2013-01-03: 8 [IU] via SUBCUTANEOUS
  Administered 2013-01-03: 3 [IU] via SUBCUTANEOUS
  Administered 2013-01-03: 5 [IU] via SUBCUTANEOUS
  Administered 2013-01-04: 3 [IU] via SUBCUTANEOUS
  Administered 2013-01-04 (×2): 5 [IU] via SUBCUTANEOUS
  Administered 2013-01-05 (×2): 3 [IU] via SUBCUTANEOUS

## 2013-01-02 MED ORDER — WARFARIN SODIUM 10 MG PO TABS
10.0000 mg | ORAL_TABLET | Freq: Once | ORAL | Status: AC
  Administered 2013-01-02: 10 mg via ORAL
  Filled 2013-01-02: qty 1

## 2013-01-02 MED ORDER — LACTATED RINGERS IV SOLN
INTRAVENOUS | Status: DC
  Administered 2013-01-02: 13:00:00 via INTRAVENOUS

## 2013-01-02 MED ORDER — COUMADIN BOOK
Freq: Once | Status: AC
  Administered 2013-01-02: 17:00:00
  Filled 2013-01-02: qty 1

## 2013-01-02 MED ORDER — MIDAZOLAM HCL 5 MG/5ML IJ SOLN
INTRAMUSCULAR | Status: DC | PRN
  Administered 2013-01-02: 2 mg via INTRAVENOUS

## 2013-01-02 SURGICAL SUPPLY — 45 items
BANDAGE ESMARK 6X9 LF (GAUZE/BANDAGES/DRESSINGS) ×1 IMPLANT
BANDAGE GAUZE ELAST BULKY 4 IN (GAUZE/BANDAGES/DRESSINGS) ×4 IMPLANT
BLADE SAW RECIP 87.9 MT (BLADE) ×2 IMPLANT
BLADE SURG 21 STRL SS (BLADE) ×2 IMPLANT
BNDG COHESIVE 4X5 WHT NS (GAUZE/BANDAGES/DRESSINGS) ×2 IMPLANT
BNDG COHESIVE 6X5 TAN STRL LF (GAUZE/BANDAGES/DRESSINGS) ×2 IMPLANT
BNDG ESMARK 6X9 LF (GAUZE/BANDAGES/DRESSINGS) ×2
CLOTH BEACON ORANGE TIMEOUT ST (SAFETY) ×2 IMPLANT
COVER SURGICAL LIGHT HANDLE (MISCELLANEOUS) ×2 IMPLANT
CUFF TOURNIQUET SINGLE 34IN LL (TOURNIQUET CUFF) IMPLANT
CUFF TOURNIQUET SINGLE 44IN (TOURNIQUET CUFF) ×2 IMPLANT
DRAIN PENROSE 1/2X12 LTX STRL (WOUND CARE) IMPLANT
DRAPE EXTREMITY T 121X128X90 (DRAPE) ×2 IMPLANT
DRAPE PROXIMA HALF (DRAPES) ×4 IMPLANT
DRAPE U-SHAPE 47X51 STRL (DRAPES) ×4 IMPLANT
DRSG ADAPTIC 3X8 NADH LF (GAUZE/BANDAGES/DRESSINGS) ×2 IMPLANT
DRSG PAD ABDOMINAL 8X10 ST (GAUZE/BANDAGES/DRESSINGS) ×2 IMPLANT
DURAPREP 26ML APPLICATOR (WOUND CARE) ×2 IMPLANT
ELECT REM PT RETURN 9FT ADLT (ELECTROSURGICAL) ×2
ELECTRODE REM PT RTRN 9FT ADLT (ELECTROSURGICAL) ×1 IMPLANT
EVACUATOR 1/8 PVC DRAIN (DRAIN) IMPLANT
GLOVE BIOGEL PI IND STRL 9 (GLOVE) ×1 IMPLANT
GLOVE BIOGEL PI INDICATOR 9 (GLOVE) ×1
GLOVE SURG ORTHO 9.0 STRL STRW (GLOVE) ×2 IMPLANT
GOWN PREVENTION PLUS XLARGE (GOWN DISPOSABLE) ×2 IMPLANT
GOWN SRG XL XLNG 56XLVL 4 (GOWN DISPOSABLE) ×1 IMPLANT
GOWN STRL NON-REIN XL XLG LVL4 (GOWN DISPOSABLE) ×1
KIT BASIN OR (CUSTOM PROCEDURE TRAY) ×2 IMPLANT
KIT ROOM TURNOVER OR (KITS) ×2 IMPLANT
MANIFOLD NEPTUNE II (INSTRUMENTS) ×2 IMPLANT
NS IRRIG 1000ML POUR BTL (IV SOLUTION) ×2 IMPLANT
PACK GENERAL/GYN (CUSTOM PROCEDURE TRAY) ×2 IMPLANT
PAD ARMBOARD 7.5X6 YLW CONV (MISCELLANEOUS) ×4 IMPLANT
SPONGE GAUZE 4X4 12PLY (GAUZE/BANDAGES/DRESSINGS) ×2 IMPLANT
SPONGE LAP 18X18 X RAY DECT (DISPOSABLE) ×4 IMPLANT
STAPLER VISISTAT 35W (STAPLE) IMPLANT
STOCKINETTE IMPERVIOUS LG (DRAPES) ×2 IMPLANT
SUT PDS AB 1 CT  36 (SUTURE) ×2
SUT PDS AB 1 CT 36 (SUTURE) ×2 IMPLANT
SUT SILK 2 0 (SUTURE) ×1
SUT SILK 2-0 18XBRD TIE 12 (SUTURE) ×1 IMPLANT
TOWEL OR 17X24 6PK STRL BLUE (TOWEL DISPOSABLE) ×2 IMPLANT
TOWEL OR 17X26 10 PK STRL BLUE (TOWEL DISPOSABLE) ×2 IMPLANT
TUBE ANAEROBIC SPECIMEN COL (MISCELLANEOUS) IMPLANT
WATER STERILE IRR 1000ML POUR (IV SOLUTION) ×2 IMPLANT

## 2013-01-02 NOTE — Preoperative (Signed)
Beta Blockers   Reason not to administer Beta Blockers:Not Applicable 

## 2013-01-02 NOTE — Anesthesia Procedure Notes (Signed)
Procedure Name: LMA Insertion Date/Time: 01/02/2013 2:21 PM Performed by: Orvilla Fus A Pre-anesthesia Checklist: Patient identified, Timeout performed, Emergency Drugs available, Suction available and Patient being monitored Patient Re-evaluated:Patient Re-evaluated prior to inductionOxygen Delivery Method: Circle system utilized Preoxygenation: Pre-oxygenation with 100% oxygen Intubation Type: IV induction Ventilation: Mask ventilation without difficulty and Oral airway inserted - appropriate to patient size LMA: LMA with gastric port inserted LMA Size: 5.0 Number of attempts: 1 Placement Confirmation: breath sounds checked- equal and bilateral and positive ETCO2 Tube secured with: Tape

## 2013-01-02 NOTE — Op Note (Signed)
OPERATIVE REPORT  DATE OF SURGERY: 01/02/2013  PATIENT:  Gabriel Howe,  52 y.o. male  PRE-OPERATIVE DIAGNOSIS:  Abscess, Osteomyelitis Right Foot  POST-OPERATIVE DIAGNOSIS:  absess, osteomyelitis right foot  PROCEDURE:  Procedure(s): AMPUTATION BELOW KNEE  SURGEON:  Surgeon(s): Nadara Mustard, MD  ANESTHESIA:   general  EBL:  min ML  SPECIMEN:  Source of Specimen:  Right leg  TOURNIQUET:   Total Tourniquet Time Documented: area (laterality) - 11 minutes Total: area (laterality) - 11 minutes   PROCEDURE DETAILS: Patient is a 52 year old gentleman poorly controlled diabetic who presented with a Charcot collapse of the right foot with osteomyelitis of the midfoot with ulceration cellulitis he has failed conservative treatment and presents at this time for transtibial amputation. Risks and benefits were discussed including infection neurovascular injury nonhealing of the wound need for additional surgery. Patient states he understands was pursued this time. Description of procedure patient brought to the operating room underwent general anesthetic. After adequate levels of anesthesia obtained patient's right lower extremity was prepped using DuraPrep draped into a sterile field. A transverse incision was made 11 cm distal the tibial tubercle this curved proximally and a large posterior flap was created. The tibia was transected just proximal skin incision the fibula was transected just proximal to the tibial incision. Amputation knife was used to create a large posterior flap. The vascular bundles were suture ligated with 2-0 silk. The sciatic nerve was pulled cut and allowed to retract. The tourniquet was deflated hemostasis obtained. The deep and superficial fascial layers were closed using #1 PDS. The skin was closed using staples. The wound is covered with Adaptic orthopedic sponges AB dressing Kerlix and Coban. Patient was extubated taken to the PACU in stable condition.  PLAN OF CARE:  Admit to inpatient   PATIENT DISPOSITION:  PACU - hemodynamically stable.   Nadara Mustard, MD 01/02/2013 3:12 PM

## 2013-01-02 NOTE — Transfer of Care (Signed)
Immediate Anesthesia Transfer of Care Note  Patient: Gabriel Howe  Procedure(s) Performed: Procedure(s) with comments: AMPUTATION BELOW KNEE (Right) - Right Below Knee Amputation  Patient Location: PACU  Anesthesia Type:General  Level of Consciousness: awake, alert , oriented and patient cooperative  Airway & Oxygen Therapy: Patient Spontanous Breathing and Patient connected to nasal cannula oxygen  Post-op Assessment: Report given to PACU RN and Post -op Vital signs reviewed and stable  Post vital signs: Reviewed and stable  Complications: No apparent anesthesia complications

## 2013-01-02 NOTE — Progress Notes (Signed)
ANTICOAGULATION CONSULT NOTE - Initial Consult  Pharmacy Consult for Coumadin Indication: VTE prophylaxis  Allergies  Allergen Reactions  . Bactroban (Mupirocin Calcium) Hives    Patient Measurements: Height: 6\' 8"  (203.2 cm) Weight:  173 kg IBW/kg (Calculated) : 96  Vital Signs: Temp: 100.5 F (38.1 C) (03/07 1520) Temp src: Oral (03/07 1151) BP: 108/84 mmHg (03/07 1605) Pulse Rate: 95 (03/07 1600)  Labs: CBC from 12/19/12 Hgb 11.2/Hct34.4/PLTC 256  SCr from 12/20/12 = 0.67  Medical History: Past Medical History  Diagnosis Date  . Heart murmur   . Bronchitis     hx of  . Neuromuscular disorder     diabetic neuropathy in feet  . Arthritis   . Diabetes mellitus without complication     "does not have primary doctor and endocrinology"    Medications:  Prescriptions prior to admission  Medication Sig Dispense Refill  . ciprofloxacin (CIPRO) 500 MG tablet Take 1 tablet (500 mg total) by mouth 2 (two) times daily.  20 tablet  0  . doxycycline (VIBRA-TABS) 100 MG tablet Take 1 tablet (100 mg total) by mouth 2 (two) times daily.  20 tablet  0  . insulin aspart (NOVOLOG) 100 UNIT/ML injection Inject 4 Units into the skin 3 (three) times daily with meals.  1 vial  3  . insulin glargine (LANTUS) 100 UNIT/ML injection Inject 20 Units into the skin at bedtime.  10 mL  12    Assessment: 52 yo M with diabetic neuropathy and ulceration/osteomyelitis of R foot which has failed conservative therapy with outpt antibiotics and wound care.  Patient presents 01/02/2013 for R transtibial amputation.  To start Coumadin for post-op VTE prophylaxis.  Goal of Therapy:  INR 2-3   Plan:  Coumadin 10 mg PO x 1 tonight. Daily INR. Coumadin book and video.  [employer], Pharm.D., BCPS Clinical Pharmacist Pager [phone] 01/02/2013 5:02 PM

## 2013-01-02 NOTE — Progress Notes (Signed)
Pt stated that he's had chills and night sweats for past 2-3 days.  Since last pm has had 3 - 4 episodes of loose stools.  Denies abd pain or nausea.  States he's had dry heaves and increase burping.  Called and spoke with Dr. Lajoyce Corners, gave him VS on pt., Temp 99, p- 104.  No orders received, will proceed with planned surgery.

## 2013-01-02 NOTE — H&P (Signed)
Gabriel Howe is an 52 y.o. male.   Chief Complaint: Ulceration pain chronic infection right foot HPI: Patient is a 52 year old gentleman with diabetic insensate neuropathy with ulceration osteomyelitis cellulitis of the right foot which has failed conservative wound care.  Past Medical History  Diagnosis Date  . Heart murmur   . Bronchitis     hx of  . Neuromuscular disorder     diabetic neuropathy in feet  . Arthritis   . Diabetes mellitus without complication     "does not have primary doctor and endocrinology"    Past Surgical History  Procedure Laterality Date  . Toe amputation      at [facility], and removal of partial of second to 2012  . Tonsillectomy      History reviewed. No pertinent family history. Social History:  reports that he has never smoked. He does not have any smokeless tobacco history on file. He reports that he does not drink alcohol or use illicit drugs.  Allergies:  Allergies  Allergen Reactions  . Bactroban (Mupirocin Calcium) Hives    No prescriptions prior to admission    No results found for this or any previous visit (from the past 48 hour(s)). No results found.  Review of Systems  All other systems reviewed and are negative.    Height 6\' 8"  (2.032 m), weight 0 kg (0 lb). Physical Exam  On examination patient has diminished pulses to the right lower extremity he has ulceration exposed bone cellulitis osteomyelitis right foot. Assessment/Plan Assessment diabetic insensate neuropathy with peripheral vascular disease osteomyelitis ulceration right foot.  Plan: We'll plan for a right transtibial amputation. Risks and benefits were discussed including potential for higher level amputation. Patient states he understands was to proceed at this time.  DUDA,MARCUS V 01/02/2013, 7:18 AM

## 2013-01-02 NOTE — Anesthesia Preprocedure Evaluation (Signed)
Anesthesia Evaluation  Patient identified by MRN, date of birth, ID band Patient awake    Reviewed: Allergy & Precautions, H&P , NPO status , Patient's Chart, lab work & pertinent test results  Airway Mallampati: II TM Distance: >3 FB Neck ROM: Full    Dental   Pulmonary COPD   + decreased breath sounds      Cardiovascular Rhythm:Regular Rate:Normal     Neuro/Psych Diabetic neuropathy    GI/Hepatic   Endo/Other  diabetesMorbid obesity  Renal/GU      Musculoskeletal   Abdominal (+) + obese,   Peds  Hematology   Anesthesia Other Findings   Reproductive/Obstetrics                           Anesthesia Physical Anesthesia Plan  ASA: III  Anesthesia Plan: General   Post-op Pain Management:    Induction: Intravenous  Airway Management Planned: LMA  Additional Equipment:   Intra-op Plan:   Post-operative Plan: Extubation in OR  Informed Consent: I have reviewed the patients History and Physical, chart, labs and discussed the procedure including the risks, benefits and alternatives for the proposed anesthesia with the patient or authorized representative who has indicated his/her understanding and acceptance.     Plan Discussed with: CRNA and Surgeon  Anesthesia Plan Comments:         Anesthesia Quick Evaluation

## 2013-01-03 LAB — PROTIME-INR
INR: 1.52 — ABNORMAL HIGH (ref 0.00–1.49)
Prothrombin Time: 17.9 seconds — ABNORMAL HIGH (ref 11.6–15.2)

## 2013-01-03 MED ORDER — WARFARIN SODIUM 10 MG PO TABS
10.0000 mg | ORAL_TABLET | Freq: Once | ORAL | Status: AC
  Administered 2013-01-03: 10 mg via ORAL
  Filled 2013-01-03: qty 1

## 2013-01-03 MED ORDER — DIPHENHYDRAMINE HCL 25 MG PO CAPS: 50.0000 mg | ORAL_CAPSULE | Freq: Four times a day (QID) | ORAL | Status: DC | PRN

## 2013-01-03 NOTE — Progress Notes (Signed)
ANTICOAGULATION CONSULT NOTE - Follow Up Consult  Pharmacy Consult for Coumadin Indication: VTE prophylaxis  Allergies  Allergen Reactions  . Bactroban (Mupirocin Calcium) Hives   Labs:  Recent Labs  01/03/13 0600  LABPROT 17.9*  INR 1.52*    The CrCl is unknown because both a height and weight (above a minimum accepted value) are required for this calculation.  Assessment: 52 year old male s/p right transtibial amputation.  Continues on Coumadin for VTE prophylaxis.  INR today = 1.52  Goal of Therapy:  INR 2-3 Monitor platelets by anticoagulation protocol: Yes   Plan:  1) Repeat Coumadin 10 mg po x 1 2) Daily INR  Thank you. Okey Regal, PharmD [phone]  01/03/2013,10:35 AM

## 2013-01-03 NOTE — Progress Notes (Signed)
Physical Therapy Evaluation Patient Details Name: Gabriel Howe MRN: [id number] DOB: Aug 27, 1961 Today's Date: 01/03/2013 Time: 4098-1191 PT Time Calculation (min): 34 min  PT Assessment / Plan / Recommendation Clinical Impression  Pt is 52 yo male s/p right BKA who moves very well given that he is 6'8" but will need a bariatric tall RW for d/c home. He will benefit from acute PT for increasing independent mobility for return to home and work. Educated him on proper positioning and preparation for prosthesis. PT will continue to follow.    PT Assessment  Patient needs continued PT services    Follow Up Recommendations  Home health PT;Supervision - Intermittent    Does the patient have the potential to tolerate intense rehabilitation      Barriers to Discharge None      Equipment Recommendations  Rolling walker with 5" wheels;Other (comment) (bari, tall)    Recommendations for Other Services OT consult   Frequency Min 4X/week    Precautions / Restrictions Precautions Precautions: Fall Restrictions Weight Bearing Restrictions: No   Pertinent Vitals/Pain 4/10 right leg pain after treatment, premedicated       Mobility  Bed Mobility Bed Mobility: Rolling Left;Left Sidelying to Sit Rolling Left: With rail;4: Min assist Left Sidelying to Sit: With rails;4: Min assist Details for Bed Mobility Assistance: vc's for sequencing, min A to RLE Transfers Transfers: Sit to Stand;Stand to Sit;Stand Pivot Transfers Sit to Stand: 4: Min assist;From bed;With upper extremity assist Stand to Sit: 4: Min assist;To chair/3-in-1;With upper extremity assist Stand Pivot Transfers: 4: Min assist Details for Transfer Assistance: min A to steady, pt able to hop on left foot 2-3 times for pivot Ambulation/Gait Ambulation/Gait Assistance: Not tested (comment) Assistive device: Rolling walker Stairs: No Wheelchair Mobility Wheelchair Mobility: No    Exercises Amputee Exercises Quad Sets:  AROM;Right;10 reps;Seated Straight Leg Raises: 10 reps;Right;Supine;AAROM   PT Diagnosis: Difficulty walking;Acute pain  PT Problem List: Decreased strength;Decreased activity tolerance;Decreased mobility;Decreased knowledge of use of DME;Decreased knowledge of precautions;Pain PT Treatment Interventions: DME instruction;Gait training;Stair training;Functional mobility training;Therapeutic activities;Therapeutic exercise;Balance training;Patient/family education   PT Goals Acute Rehab PT Goals PT Goal Formulation: With patient Time For Goal Achievement: 01/17/13 Potential to Achieve Goals: Good Pt will go Supine/Side to Sit: with modified independence PT Goal: Supine/Side to Sit - Progress: Goal set today Pt will go Sit to Supine/Side: with modified independence PT Goal: Sit to Supine/Side - Progress: Goal set today Pt will go Sit to Stand: with modified independence PT Goal: Sit to Stand - Progress: Goal set today Pt will go Stand to Sit: with modified independence PT Goal: Stand to Sit - Progress: Goal set today Pt will Transfer Bed to Chair/Chair to Bed: with modified independence PT Transfer Goal: Bed to Chair/Chair to Bed - Progress: Goal set today Pt will Ambulate: 1 - 15 feet;with modified independence;with rolling walker PT Goal: Ambulate - Progress: Goal set today Pt will Go Up / Down Stairs: 1-2 stairs;with supervision;with rolling walker PT Goal: Up/Down Stairs - Progress: Goal set today Pt will Perform Home Exercise Program: Independently PT Goal: Perform Home Exercise Program - Progress: Goal set today  Visit Information  Last PT Received On: 01/03/13 Assistance Needed: +1    Subjective Data  Subjective: pt reports that he has had to hop on the left LE several times before because of trouble with the right leg Patient Stated Goal: return home and work   Prior Functioning  Home Living Lives With: Alone Available Help at Discharge:  Family;Available 24  hours/day Type of Home: House Home Access: Stairs to enter [employer] of Steps: 1 Entrance Stairs-Rails: None Home Layout: One level Home Adaptive Equipment: Crutches;Wheelchair - manual Additional Comments: pt's mother is coming to stay with him as long as he needs when he first goes home Prior Function Level of Independence: Independent Able to Take Stairs?: Yes Driving: Yes Vocation: Full time employment 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    Copywriter, advertising Overall Cognitive Status: Appears within functional limits for tasks assessed/performed Arousal/Alertness: Awake/alert Orientation Level: Oriented X4 / Intact Behavior During Session: WFL for tasks performed    Extremity/Trunk Assessment Right Upper Extremity Assessment RUE ROM/Strength/Tone: Within functional levels Left Upper Extremity Assessment LUE ROM/Strength/Tone: Within functional levels Right Lower Extremity Assessment RLE ROM/Strength/Tone: Deficits RLE ROM/Strength/Tone Deficits: hip flex 2+/5 RLE Sensation: Deficits RLE Sensation Deficits: beginning to have phantom limb pain RLE Coordination: WFL - gross motor Left Lower Extremity Assessment LLE ROM/Strength/Tone: Within functional levels LLE Sensation: Deficits LLE Sensation Deficits: wound distal leg and toe LLE Coordination: WFL - gross motor Trunk Assessment Trunk Assessment: Normal   Balance Balance Balance Assessed: Yes Static Standing Balance Static Standing - Balance Support: Bilateral upper extremity supported;During functional activity Static Standing - Level of Assistance: 4: Min assist  End of Session PT - End of Session Equipment Utilized During Treatment: Gait belt Activity Tolerance: Patient tolerated treatment well Patient left: in chair;with call bell/phone within reach Nurse Communication: Mobility status  GP   Lyanne Co, PT  Acute Rehab Services   [phone]   Lyanne Co 01/03/2013, 1:31 PM

## 2013-01-03 NOTE — Progress Notes (Signed)
Subjective: 1 Day Post-Op Procedure(s) (LRB): AMPUTATION BELOW KNEE (Right) Patient reports pain as moderate.  No complaints otherwise. Tolerating diet well.   Objective: Vital signs in last 24 hours: Temp:  [97.2 F (36.2 C)-100.5 F (38.1 C)] 97.8 F (36.6 C) (03/08 0652) Pulse Rate:  [77-104] 79 (03/08 0652) Resp:  [9-20] 16 (03/08 0652) BP: (98-140)/(46-84) 115/72 mmHg (03/08 0652) SpO2:  [91 %-100 %] 97 % (03/08 0652)  Intake/Output from previous day: 03/07 0701 - 03/08 0700 In: 1150 [I.V.:1000; IV Piggyback:150] Out: 950 [Urine:800; Blood:150] Intake/Output this shift:    No results found for this basename: HGB,  in the last 72 hours No results found for this basename: WBC, RBC, HCT, PLT,  in the last 72 hours No results found for this basename: NA, K, CL, CO2, BUN, CREATININE, GLUCOSE, CALCIUM,  in the last 72 hours  Recent Labs  01/03/13 0600  INR 1.52*   Right lower leg: Incision: dressing C/D/I Compartment soft  Assessment/Plan: 1 Day Post-Op Procedure(s) (LRB): AMPUTATION BELOW KNEE (Right) Up with therapy Encourage incentive spirometry.   Richardean Canal 01/03/2013, 8:41 AM

## 2013-01-04 LAB — CBC WITH DIFFERENTIAL/PLATELET
Basophils Absolute: 0 10*3/uL (ref 0.0–0.1)
Basophils Relative: 0 % (ref 0–1)
Eosinophils Relative: 1 % (ref 0–5)
Lymphocytes Relative: 25 % (ref 12–46)
MCHC: 32.5 g/dL (ref 30.0–36.0)
Neutro Abs: 4.9 10*3/uL (ref 1.7–7.7)
Platelets: 256 10*3/uL (ref 150–400)
RDW: 14.9 % (ref 11.5–15.5)
WBC: 7.7 10*3/uL (ref 4.0–10.5)

## 2013-01-04 LAB — GLUCOSE, CAPILLARY
Glucose-Capillary: 206 mg/dL — ABNORMAL HIGH (ref 70–99)
Glucose-Capillary: 241 mg/dL — ABNORMAL HIGH (ref 70–99)

## 2013-01-04 LAB — PROTIME-INR
INR: 2.03 — ABNORMAL HIGH (ref 0.00–1.49)
Prothrombin Time: 22.1 seconds — ABNORMAL HIGH (ref 11.6–15.2)

## 2013-01-04 MED ORDER — WARFARIN SODIUM 5 MG PO TABS
5.0000 mg | ORAL_TABLET | Freq: Once | ORAL | Status: AC
  Administered 2013-01-04: 5 mg via ORAL
  Filled 2013-01-04: qty 1

## 2013-01-04 MED ORDER — DOXYCYCLINE HYCLATE 100 MG PO TABS
100.0000 mg | ORAL_TABLET | Freq: Two times a day (BID) | ORAL | Status: DC
  Administered 2013-01-04 – 2013-01-05 (×3): 100 mg via ORAL
  Filled 2013-01-04 (×4): qty 1

## 2013-01-04 NOTE — Progress Notes (Signed)
ANTICOAGULATION CONSULT NOTE - Follow Up Consult  Pharmacy Consult for Coumadin Indication: VTE prophylaxis  Allergies  Allergen Reactions  . Bactroban (Mupirocin Calcium) Hives   Labs:  Recent Labs  01/03/13 0600 01/04/13 0650  LABPROT 17.9* 22.1*  INR 1.52* 2.03*    The CrCl is unknown because both a height and weight (above a minimum accepted value) are required for this calculation.  Assessment: 52 year old male s/p right transtibial amputation.  Continues on Coumadin for VTE prophylaxis.  INR today = 2.03  Goal of Therapy:  INR 2-3 Monitor platelets by anticoagulation protocol: Yes   Plan:  1) Coumadin 5 mg po x 1 2) Daily INR  Thank you. Okey Regal, PharmD [phone]  01/04/2013,10:41 AM

## 2013-01-04 NOTE — Progress Notes (Signed)
Subjective: 2 Days Post-Op Procedure(s) (LRB): AMPUTATION BELOW KNEE (Right) Patient reports pain as moderate.  Reports severe pain last night pain now 4/10. Appears comfortable. Voices concerns about no long term antibiotics.  Objective: Vital signs in last 24 hours: Temp:  [98.1 F (36.7 C)-100.3 F (37.9 C)] 98.1 F (36.7 C) (03/09 1610) Pulse Rate:  [82-92] 82 (03/09 0638) Resp:  [16] 16 (03/09 0638) BP: (119-139)/(71-83) 119/71 mmHg (03/09 0638) SpO2:  [97 %-100 %] 97 % (03/09 9604)  Intake/Output from previous day: 03/08 0701 - 03/09 0700 In: 960 [P.O.:960] Out: -  Intake/Output this shift:    No results found for this basename: HGB,  in the last 72 hours No results found for this basename: WBC, RBC, HCT, PLT,  in the last 72 hours No results found for this basename: NA, K, CL, CO2, BUN, CREATININE, GLUCOSE, CALCIUM,  in the last 72 hours  Recent Labs  01/03/13 0600 01/04/13 0650  INR 1.52* 2.03*   Right lower extremity Incision: dressing C/D/I Left great toe ulceration plantar aspect of MTP joint no gross purulence.  Assessment/Plan: 2 Days Post-Op Procedure(s) (LRB): AMPUTATION BELOW KNEE (Right) Moist to dry dressing change left foot great toe ulceration daily Will place on empirical Doxycyline Encourage incentive spirometry Check CBC   CLARK, GILBERT 01/04/2013, 8:37 AM

## 2013-01-05 ENCOUNTER — Encounter (HOSPITAL_COMMUNITY): Payer: Self-pay | Admitting: Orthopedic Surgery

## 2013-01-05 LAB — GLUCOSE, CAPILLARY: Glucose-Capillary: 188 mg/dL — ABNORMAL HIGH (ref 70–99)

## 2013-01-05 LAB — PROTIME-INR
INR: 2.79 — ABNORMAL HIGH (ref 0.00–1.49)
Prothrombin Time: 28 seconds — ABNORMAL HIGH (ref 11.6–15.2)

## 2013-01-05 MED ORDER — OXYCODONE-ACETAMINOPHEN 5-325 MG PO TABS: 1.0000 | ORAL_TABLET | ORAL | Status: DC | PRN

## 2013-01-05 MED ORDER — WARFARIN SODIUM 1 MG PO TABS: 1.0000 mg | ORAL_TABLET | Freq: Every day | ORAL | Status: DC

## 2013-01-05 MED ORDER — METHOCARBAMOL 500 MG PO TABS: 500.0000 mg | ORAL_TABLET | Freq: Three times a day (TID) | ORAL | Status: DC

## 2013-01-05 NOTE — Progress Notes (Signed)
CARE MANAGEMENT NOTE 01/05/2013  Patient:  NAI, DASCH   Account Number:  [id number]  Date Initiated:  01/05/2013  Documentation initiated by:  Vance Peper  Subjective/Objective Assessment:   52 yr old male s/p right BKA.     Action/Plan:   CM spoke with patient concerning home health and DME needs. Choice offered. Patient has his own rolling walker. Called Jodene Nam, [facility] liasion  with request.   Anticipated DC Date:  01/05/2013   Anticipated DC Plan:  HOME W HOME HEALTH SERVICES      DC Planning Services  CM consult      [facility] Choice  HOME HEALTH   Choice offered to / List presented to:  C-1 Patient        HH arranged  HH-2 PT      [facility] agency  [facility]   Status of service:  Completed, signed off Medicare Important Message given?   (If response is "NO", the following Medicare IM given date fields will be blank) Date Medicare IM given:   Date Additional Medicare IM given:    Discharge Disposition:  HOME W HOME HEALTH SERVICES  Per UR Regulation:    If discussed at Long Length of Stay Meetings, dates discussed:    Comments:

## 2013-01-05 NOTE — Progress Notes (Signed)
Patient provided with discharge instructions and follow up appointment information. He is aware of medication regimen and will be going home health.

## 2013-01-05 NOTE — Anesthesia Postprocedure Evaluation (Signed)
   Anesthesia Post-op Note  Patient: Gabriel Howe  Procedure(s) Performed: Procedure(s) with comments: AMPUTATION BELOW KNEE (Right) - Right Below Knee Amputation  Patient Location: PACU  Anesthesia Type:General  Level of Consciousness: awake and alert   Airway and Oxygen Therapy: Patient Spontanous Breathing  Post-op Pain: mild  Post-op Assessment: Post-op Vital signs reviewed, Patient's Cardiovascular Status Stable, Respiratory Function Stable, Patent Airway, No signs of Nausea or vomiting and Pain level controlled  Post-op Vital Signs: stable  Complications: No apparent anesthesia complications

## 2013-01-05 NOTE — Discharge Summary (Signed)
Physician Discharge Summary  Patient ID: Gabriel Howe MRN: [id number] DOB/AGE: 51-Aug-1962 51 y.o.  Admit date: 01/02/2013 Discharge date: 01/05/2013  Admission Diagnoses: Osteomyelitis ulceration right foot  Discharge Diagnoses: Osteomyelitis ulceration right foot Active Problems:   * No active hospital problems. *   Discharged Condition: stable  Hospital Course: Patient's hospital course was essentially unremarkable. He underwent a transtibial amputation of the right. Patient progressed well with therapy he was discharged to home in stable condition.  Consults: None  Significant Diagnostic Studies: labs: Routine labs  Treatments: surgery: See operative note  Discharge Exam: Blood pressure 128/78, pulse 79, temperature 98 F (36.7 C), temperature source Oral, resp. rate 19, height 6\' 8"  (2.032 m), weight 0 kg (0 lb), SpO2 97.00%. Incision/Wound: dressing clean dry and intact  Disposition: 01-Home or Self Care  Discharge Orders   Future Orders Complete By Expires     Call MD / Call 911  As directed     Comments:      If you experience chest pain or shortness of breath, CALL 911 and be transported to the hospital emergency room.  If you develope a fever above 101 F, pus (white drainage) or increased drainage or redness at the wound, or calf pain, call your surgeon's office.    Constipation Prevention  As directed     Comments:      Drink plenty of fluids.  Prune juice may be helpful.  You may use a stool softener, such as Colace (over the counter) 100 mg twice a day.  Use MiraLax (over the counter) for constipation as needed.    Diet - low sodium heart healthy  As directed     Increase activity slowly as tolerated  As directed         Medication List    TAKE these medications       ciprofloxacin 500 MG tablet  Commonly known as:  CIPRO  Take 1 tablet (500 mg total) by mouth 2 (two) times daily.     doxycycline 100 MG tablet  Commonly known as:  VIBRA-TABS  Take 1  tablet (100 mg total) by mouth 2 (two) times daily.     insulin aspart 100 UNIT/ML injection  Commonly known as:  novoLOG  Inject 4 Units into the skin 3 (three) times daily with meals.     insulin glargine 100 UNIT/ML injection  Commonly known as:  LANTUS  Inject 20 Units into the skin at bedtime.     methocarbamol 500 MG tablet  Commonly known as:  ROBAXIN  Take 1 tablet (500 mg total) by mouth 3 (three) times daily.     oxyCODONE-acetaminophen 5-325 MG per tablet  Commonly known as:  ROXICET  Take 1 tablet by mouth every 4 (four) hours as needed for pain.     warfarin 1 MG tablet  Commonly known as:  COUMADIN  Take 1 tablet (1 mg total) by mouth daily.           Follow-up Information   Follow up with DUDA,MARCUS V, MD In 1 week.   Contact information:   [address] [place] [address] [phone]       Signed: Nadara Mustard 01/05/2013, 6:24 AM

## 2013-01-05 NOTE — Progress Notes (Signed)
Physical Therapy Treatment Patient Details Name: Gabriel Howe MRN: [id number] DOB: [birth year] Today's Date: 01/05/2013 Time: 4098-1191 PT Time Calculation (min): 34 min  PT Assessment / Plan / Recommendation Comments on Treatment Session  Patient progressing well this session. ABle to tolerate hall ambulation and stair training without difficulty or increased pain. Very pleasent to work with. Eager to discharge home today. Given HEP and handout on phantom pain    Follow Up Recommendations  Home health PT;Supervision - Intermittent     Does the patient have the potential to tolerate intense rehabilitation     Barriers to Discharge        Equipment Recommendations  Rolling walker with 5" wheels;Other (comment) (bari and tall)    Recommendations for Other Services    Frequency Min 4X/week   Plan Discharge plan remains appropriate;Frequency remains appropriate    Precautions / Restrictions Precautions Precautions: Fall Restrictions Weight Bearing Restrictions: Yes RLE Weight Bearing: Non weight bearing   Pertinent Vitals/Pain no apparent distress     Mobility  Bed Mobility Rolling Left: 6: Modified independent (Device/Increase time) Left Sidelying to Sit: 5: Supervision;With rails Details for Bed Mobility Assistance: no cues needed Transfers Sit to Stand: 4: Min guard;With upper extremity assist;From chair/3-in-1 Stand to Sit: 4: Min guard;With upper extremity assist;To chair/3-in-1;To bed Details for Transfer Assistance: MinGuard A for safety.  Ambulation/Gait Ambulation/Gait Assistance: 4: Min guard Ambulation Distance (Feet): 50 Feet Assistive device: Rolling walker Ambulation/Gait Assistance Details: Patient did well with ambulation. No LOB noted.  Gait Pattern: Step-to pattern Stairs: Yes Stairs Assistance: 4: Min guard Stair Management Technique: Step to pattern;Forwards;Two rails Number of Stairs: 3    Exercises     PT Diagnosis:    PT Problem List:   PT  Treatment Interventions:     PT Goals Acute Rehab PT Goals PT Goal: Supine/Side to Sit - Progress: Met PT Goal: Sit to Stand - Progress: Progressing toward goal PT Goal: Stand to Sit - Progress: Progressing toward goal PT Goal: Ambulate - Progress: Progressing toward goal PT Goal: Up/Down Stairs - Progress: Progressing toward goal  Visit Information  Last PT Received On: 01/05/13 Assistance Needed: +1    Subjective Data      Cognition  Cognition Overall Cognitive Status: Appears within functional limits for tasks assessed/performed Arousal/Alertness: Awake/alert Orientation Level: Oriented X4 / Intact Behavior During Session: [facility] for tasks performed    Balance     End of Session PT - End of Session Equipment Utilized During Treatment: Gait belt Activity Tolerance: Patient tolerated treatment well Patient left: in chair;with call bell/phone within reach Nurse Communication: Mobility status   GP     Fredrich Birks 01/05/2013, 8:54 AM 01/05/2013 Fredrich Birks PTA [phone] pager [phone] office

## 2013-01-05 NOTE — Evaluation (Signed)
Occupational Therapy Evaluation Patient Details Name: Gabriel Howe MRN: [id number] DOB: 1961-03-15 Today's Date: 01/05/2013 Time: 6295-2841 OT Time Calculation (min): 29 min  OT Assessment / Plan / Recommendation Clinical Impression  Pt referred to OT services following R BKA. Pt at set up level with UB ADLs, min A level with LB ADLs and min guard with ADL mobility. No further acute OT services needed at this time, all education completed. OT will sign off    OT Assessment  Patient does not need any further OT services    Follow Up Recommendations  Home health OT;Supervision/Assistance - 24 hour    Barriers to Discharge  None, pt's mother will be at [facility] e with him , then in 1 week he will go home with her    Equipment Recommendations  Tub/shower bench    Recommendations for Other Services    Frequency       Precautions / Restrictions Precautions Precautions: Fall Restrictions Weight Bearing Restrictions: Yes RLE Weight Bearing: Non weight bearing       ADL  Grooming: Performed;Wash/dry hands;Wash/dry face;Brushing hair;Supervision/safety;Set up Upper Body Bathing: Set up;Simulated Lower Body Bathing: Minimal assistance;Simulated Upper Body Dressing: Performed;Set up Lower Body Dressing: Performed;Minimal assistance Toilet Transfer: Performed;Min guard Toilet Transfer Method: Sit to stand;Other (comment) (ambulating with RW) Toilet Transfer Equipment: Raised toilet seat with arms (or 3-in-1 over toilet);Grab bars Toileting - Clothing Manipulation and Hygiene: Performed;Min guard Where Assessed - Engineer, mining and Hygiene: Standing Tub/Shower Transfer: Performed;Min guard Tub/Shower Transfer Method: Science writer: Walk in shower;Grab bars;Shower seat with back Equipment Used: Long-handled shoe horn;Long-handled sponge;Reacher;Rolling walker;Gait belt ADL Comments: pt and his mother provided with education and demo of ADL A/E  for use at home    OT Diagnosis:    OT Problem List:   OT Treatment Interventions:     OT Goals    Visit Information  Last OT Received On: 01/05/13 Assistance Needed: +1    Subjective Data  Subjective: " I will have plenty of help when I go home and in 1 week I will go to my mother's house in [place] " Patient Stated Goal: To return home   Prior Functioning     Home Living Lives With: Alone Available Help at Discharge: Family;Available 24 hours/day Type of Home: House Home Access: Stairs to enter [employer] of Steps: 1 Entrance Stairs-Rails: None Home Layout: One level Bathroom Shower/Tub: Engineer, manufacturing systems: Handicapped height Home Adaptive Equipment: Crutches;Wheelchair - manual Additional Comments: pt's mother is coming to stay with him as long as he needs when he first goes home then he will go home with her  Prior Function Level of Independence: Independent Able to Take Stairs?: Yes Driving: Yes Vocation: Full time employment Communication Communication: No difficulties Dominant Hand: Right         Vision/Perception Vision - History Baseline Vision: Wears glasses all the time Patient Visual Report: No change from baseline Perception Perception: Within Functional Limits   Cognition  Cognition Overall Cognitive Status: Appears within functional limits for tasks assessed/performed Arousal/Alertness: Awake/alert Orientation Level: Oriented X4 / Intact Behavior During Session: [facility] for tasks performed    Extremity/Trunk Assessment Right Upper Extremity Assessment RUE ROM/Strength/Tone: [facility] for tasks assessed Left Upper Extremity Assessment LUE ROM/Strength/Tone: WFL for tasks assessed     Mobility Bed Mobility Bed Mobility: Not assessed Rolling Left: 6: Modified independent (Device/Increase time) Left Sidelying to Sit: 5: Supervision;With rails Details for Bed Mobility Assistance: no cues needed Transfers Transfers: Sit  to Stand;Stand to Sit Sit to Stand: Without upper extremity assist;4: Min guard;With armrests;From chair/3-in-1 Stand to Sit: 4: Min guard;Without upper extremity assist;With armrests;To chair/3-in-1;To bed Details for Transfer Assistance: MinGuard A for safety.      Exercise     Balance Balance Balance Assessed: No   End of Session OT - End of Session Equipment Utilized During Treatment: Gait belt (RW, 3 in 1, ADL A/E) Activity Tolerance: Patient tolerated treatment well Patient left: in bed;with call bell/phone within reach;with family/visitor present  GO     Galen Manila 01/05/2013, 12:17 PM

## 2013-01-28 ENCOUNTER — Encounter (HOSPITAL_BASED_OUTPATIENT_CLINIC_OR_DEPARTMENT_OTHER): Payer: BC Managed Care – PPO | Attending: General Surgery

## 2013-01-28 DIAGNOSIS — Z79899 Other long term (current) drug therapy: Secondary | ICD-10-CM | POA: Insufficient documentation

## 2013-01-28 DIAGNOSIS — S78119A Complete traumatic amputation at level between unspecified hip and knee, initial encounter: Secondary | ICD-10-CM | POA: Insufficient documentation

## 2013-01-28 DIAGNOSIS — E1169 Type 2 diabetes mellitus with other specified complication: Secondary | ICD-10-CM | POA: Insufficient documentation

## 2013-01-28 DIAGNOSIS — L97509 Non-pressure chronic ulcer of other part of unspecified foot with unspecified severity: Secondary | ICD-10-CM | POA: Insufficient documentation

## 2013-01-28 DIAGNOSIS — Z794 Long term (current) use of insulin: Secondary | ICD-10-CM | POA: Insufficient documentation

## 2013-01-29 NOTE — Progress Notes (Signed)
[facility]  NAME:  Gabriel Howe, Gabriel Howe NO.:  [id number]  MEDICAL RECORD NO.:  [id number]      DATE OF BIRTH:  1961-08-10  PHYSICIAN:  Ardath Sax, M.D.           VISIT DATE:                                  OFFICE VISIT   This is a 52 year old diabetic who is on insulin at the present time. He has been a patient here for a couple of years because of both diabetic foot ulcers and venous stasis ulcers.  We have treated him with multitude of treatments everything from Apligraf to Dermagraft, to Unna boots.  He finally developed osteo in his right foot and he recently has undergone a right below-knee amputation.  He is recovering nicely from that, and he still on the left foot has about a 2-cm diabetic foot ulcer, Wagner III.  We are going to treat this with silver alginate.  I debrided it today of callus, and I think he should do well.  He is already offloading and he understands very well his problems and what to do.  He is on metformin and lovastatin and Lantus and NovoLog insulin. At the present time, he is on amoxicillin by his doctor, and they did the amputation, so we are going to treat him with the silver alginate and see him next week.  His vital signs when he was here, he had a blood pressure of 145/88, respirations 18, pulse 62, temperature 97.7.  He weighed 340 pounds which is 50 pounds less than he was when he was here prior to his amputation.  His blood sugars have been much better.  He said one this morning was 150.  DIAGNOSES:  Post above-knee amputation, right leg; Wagner III diabetic foot ulcer, left plantar; type 2 diabetes.     Ardath Sax, M.D.     PP/MEDQ  D:  01/28/2013  T:  01/28/2013  Job:  [id number]

## 2013-03-04 ENCOUNTER — Encounter (HOSPITAL_BASED_OUTPATIENT_CLINIC_OR_DEPARTMENT_OTHER): Payer: BC Managed Care – PPO

## 2013-04-22 ENCOUNTER — Encounter (HOSPITAL_BASED_OUTPATIENT_CLINIC_OR_DEPARTMENT_OTHER): Payer: BC Managed Care – PPO | Attending: General Surgery

## 2013-04-22 DIAGNOSIS — E1169 Type 2 diabetes mellitus with other specified complication: Secondary | ICD-10-CM | POA: Insufficient documentation

## 2013-04-22 DIAGNOSIS — L97509 Non-pressure chronic ulcer of other part of unspecified foot with unspecified severity: Secondary | ICD-10-CM | POA: Insufficient documentation

## 2013-04-23 ENCOUNTER — Encounter (HOSPITAL_COMMUNITY): Payer: Self-pay | Admitting: *Deleted

## 2013-04-23 ENCOUNTER — Other Ambulatory Visit (HOSPITAL_COMMUNITY): Payer: Self-pay | Admitting: Orthopedic Surgery

## 2013-04-23 ENCOUNTER — Encounter (HOSPITAL_COMMUNITY): Payer: Self-pay | Admitting: Pharmacy Technician

## 2013-04-23 NOTE — Progress Notes (Signed)
Spoke with Elnita Maxwell about having Dr.Duda sign his orders

## 2013-04-23 NOTE — Progress Notes (Signed)
Pt doesn't have a cardiologist  Pt doesn't have a medical md but if gets sick will go to [facility]gent [facility] on [address] in [place]  Pt sees Geoffery Spruce PA for Endocrinology  Denies ever having an echo/stress test/heart cath  EKG in epic from 01-02-13  CXR in epic from 09-05-12

## 2013-04-24 MED ORDER — CEFAZOLIN SODIUM 10 G IJ SOLR
3.0000 g | INTRAMUSCULAR | Status: AC
  Administered 2013-04-25: 3 g via INTRAVENOUS
  Filled 2013-04-24: qty 3000

## 2013-04-25 ENCOUNTER — Ambulatory Visit (HOSPITAL_COMMUNITY): Payer: BC Managed Care – PPO | Admitting: Anesthesiology

## 2013-04-25 ENCOUNTER — Encounter (HOSPITAL_COMMUNITY): Payer: Self-pay | Admitting: Anesthesiology

## 2013-04-25 ENCOUNTER — Ambulatory Visit (HOSPITAL_COMMUNITY)
Admission: RE | Admit: 2013-04-25 | Discharge: 2013-04-25 | Disposition: A | Discharge status: Home / Self Care | Payer: BC Managed Care – PPO | Source: Ambulatory Visit | Attending: Orthopedic Surgery | Admitting: Orthopedic Surgery

## 2013-04-25 ENCOUNTER — Encounter (HOSPITAL_COMMUNITY)
Admission: RE | Disposition: A | Discharge status: Home / Self Care | Payer: Self-pay | Source: Ambulatory Visit | Attending: Orthopedic Surgery

## 2013-04-25 DIAGNOSIS — L02619 Cutaneous abscess of unspecified foot: Secondary | ICD-10-CM | POA: Insufficient documentation

## 2013-04-25 DIAGNOSIS — M129 Arthropathy, unspecified: Secondary | ICD-10-CM | POA: Insufficient documentation

## 2013-04-25 DIAGNOSIS — E1149 Type 2 diabetes mellitus with other diabetic neurological complication: Secondary | ICD-10-CM | POA: Insufficient documentation

## 2013-04-25 DIAGNOSIS — Z87891 Personal history of nicotine dependence: Secondary | ICD-10-CM | POA: Insufficient documentation

## 2013-04-25 DIAGNOSIS — Z794 Long term (current) use of insulin: Secondary | ICD-10-CM | POA: Insufficient documentation

## 2013-04-25 DIAGNOSIS — Z881 Allergy status to other antibiotic agents status: Secondary | ICD-10-CM | POA: Insufficient documentation

## 2013-04-25 DIAGNOSIS — M869 Osteomyelitis, unspecified: Secondary | ICD-10-CM | POA: Insufficient documentation

## 2013-04-25 DIAGNOSIS — E785 Hyperlipidemia, unspecified: Secondary | ICD-10-CM | POA: Insufficient documentation

## 2013-04-25 DIAGNOSIS — Z79899 Other long term (current) drug therapy: Secondary | ICD-10-CM | POA: Insufficient documentation

## 2013-04-25 DIAGNOSIS — L03039 Cellulitis of unspecified toe: Secondary | ICD-10-CM | POA: Insufficient documentation

## 2013-04-25 DIAGNOSIS — S88119A Complete traumatic amputation at level between knee and ankle, unspecified lower leg, initial encounter: Secondary | ICD-10-CM | POA: Insufficient documentation

## 2013-04-25 DIAGNOSIS — L97509 Non-pressure chronic ulcer of other part of unspecified foot with unspecified severity: Secondary | ICD-10-CM | POA: Insufficient documentation

## 2013-04-25 DIAGNOSIS — Z91013 Allergy to seafood: Secondary | ICD-10-CM | POA: Insufficient documentation

## 2013-04-25 DIAGNOSIS — S98139A Complete traumatic amputation of one unspecified lesser toe, initial encounter: Secondary | ICD-10-CM | POA: Insufficient documentation

## 2013-04-25 DIAGNOSIS — E1142 Type 2 diabetes mellitus with diabetic polyneuropathy: Secondary | ICD-10-CM | POA: Insufficient documentation

## 2013-04-25 HISTORY — PX: AMPUTATION: SHX166

## 2013-04-25 HISTORY — DX: Hyperlipidemia, unspecified: E78.5

## 2013-04-25 LAB — COMPREHENSIVE METABOLIC PANEL
ALT: 7 U/L (ref 0–53)
AST: 10 U/L (ref 0–37)
Albumin: 3.2 g/dL — ABNORMAL LOW (ref 3.5–5.2)
Alkaline Phosphatase: 91 U/L (ref 39–117)
CO2: 26 mEq/L (ref 19–32)
Chloride: 100 mEq/L (ref 96–112)
Creatinine, Ser: 0.76 mg/dL (ref 0.50–1.35)
GFR calc non Af Amer: >90 mL/min (ref >90)
Potassium: 3.9 mEq/L (ref 3.5–5.1)
Sodium: 137 mEq/L (ref 135–145)
Total Bilirubin: 0.5 mg/dL (ref 0.3–1.2)

## 2013-04-25 LAB — GLUCOSE, CAPILLARY

## 2013-04-25 LAB — CBC
HCT: 39.3 % (ref 39.0–52.0)
Hemoglobin: 13.6 g/dL (ref 13.0–17.0)
Platelets: 297 10*3/uL (ref 150–400)

## 2013-04-25 SURGERY — AMPUTATION, FOOT, RAY
Anesthesia: General | Site: Foot | Laterality: Left

## 2013-04-25 MED ORDER — DOXYCYCLINE HYCLATE 50 MG PO CAPS: 100.0000 mg | ORAL_CAPSULE | Freq: Two times a day (BID) | ORAL | Status: DC

## 2013-04-25 MED ORDER — LIDOCAINE HCL (CARDIAC) 20 MG/ML IV SOLN
INTRAVENOUS | Status: DC | PRN
  Administered 2013-04-25: 50 mg via INTRAVENOUS

## 2013-04-25 MED ORDER — OXYCODONE HCL 5 MG PO TABS: 5.0000 mg | ORAL_TABLET | Freq: Once | ORAL | Status: DC | PRN

## 2013-04-25 MED ORDER — PROPOFOL 10 MG/ML IV BOLUS
INTRAVENOUS | Status: DC | PRN
  Administered 2013-04-25: 200 mg via INTRAVENOUS

## 2013-04-25 MED ORDER — LACTATED RINGERS IV SOLN
INTRAVENOUS | Status: DC | PRN
  Administered 2013-04-25: 07:00:00 via INTRAVENOUS

## 2013-04-25 MED ORDER — 0.9 % SODIUM CHLORIDE (POUR BTL) OPTIME
TOPICAL | Status: DC | PRN
  Administered 2013-04-25: 1000 mL

## 2013-04-25 MED ORDER — FENTANYL CITRATE 0.05 MG/ML IJ SOLN
INTRAMUSCULAR | Status: DC | PRN
  Administered 2013-04-25: 150 ug via INTRAVENOUS
  Administered 2013-04-25: 100 ug via INTRAVENOUS

## 2013-04-25 MED ORDER — OXYCODONE HCL 5 MG/5ML PO SOLN: 5.0000 mg | Freq: Once | ORAL | Status: DC | PRN

## 2013-04-25 MED ORDER — ONDANSETRON HCL 4 MG/2ML IJ SOLN
INTRAMUSCULAR | Status: DC | PRN
  Administered 2013-04-25: 4 mg via INTRAVENOUS

## 2013-04-25 MED ORDER — MIDAZOLAM HCL 5 MG/5ML IJ SOLN
INTRAMUSCULAR | Status: DC | PRN
  Administered 2013-04-25: 2 mg via INTRAVENOUS

## 2013-04-25 MED ORDER — HYDROMORPHONE HCL PF 1 MG/ML IJ SOLN: 0.2500 mg | INTRAMUSCULAR | Status: DC | PRN

## 2013-04-25 SURGICAL SUPPLY — 41 items
BANDAGE ESMARK 6X9 LF (GAUZE/BANDAGES/DRESSINGS) IMPLANT
BANDAGE GAUZE ELAST BULKY 4 IN (GAUZE/BANDAGES/DRESSINGS) ×2 IMPLANT
BLADE SAW SGTL MED 73X18.5 STR (BLADE) IMPLANT
BNDG COHESIVE 4X5 TAN STRL (GAUZE/BANDAGES/DRESSINGS) ×2 IMPLANT
BNDG COHESIVE 6X5 TAN STRL LF (GAUZE/BANDAGES/DRESSINGS) ×2 IMPLANT
BNDG ESMARK 6X9 LF (GAUZE/BANDAGES/DRESSINGS)
CANISTER SUCTION 2500CC (MISCELLANEOUS) ×2 IMPLANT
CLOTH BEACON ORANGE TIMEOUT ST (SAFETY) ×2 IMPLANT
CUFF TOURNIQUET SINGLE 34IN LL (TOURNIQUET CUFF) IMPLANT
CUFF TOURNIQUET SINGLE 44IN (TOURNIQUET CUFF) IMPLANT
DRAPE U-SHAPE 47X51 STRL (DRAPES) ×2 IMPLANT
DRSG ADAPTIC 3X8 NADH LF (GAUZE/BANDAGES/DRESSINGS) ×2 IMPLANT
DRSG PAD ABDOMINAL 8X10 ST (GAUZE/BANDAGES/DRESSINGS) ×2 IMPLANT
DURAPREP 26ML APPLICATOR (WOUND CARE) ×2 IMPLANT
ELECT REM PT RETURN 9FT ADLT (ELECTROSURGICAL) ×2
ELECTRODE REM PT RTRN 9FT ADLT (ELECTROSURGICAL) ×1 IMPLANT
GLOVE BIOGEL PI IND STRL 9 (GLOVE) ×1 IMPLANT
GLOVE BIOGEL PI INDICATOR 9 (GLOVE) ×1
GLOVE SURG ORTHO 9.0 STRL STRW (GLOVE) ×2 IMPLANT
GOWN PREVENTION PLUS XLARGE (GOWN DISPOSABLE) ×2 IMPLANT
GOWN SRG XL XLNG 56XLVL 4 (GOWN DISPOSABLE) ×1 IMPLANT
GOWN STRL NON-REIN XL XLG LVL4 (GOWN DISPOSABLE) ×1
KIT BASIN OR (CUSTOM PROCEDURE TRAY) ×2 IMPLANT
KIT ROOM TURNOVER OR (KITS) ×2 IMPLANT
MANIFOLD NEPTUNE II (INSTRUMENTS) IMPLANT
NS IRRIG 1000ML POUR BTL (IV SOLUTION) ×2 IMPLANT
PACK ORTHO EXTREMITY (CUSTOM PROCEDURE TRAY) ×2 IMPLANT
PAD ARMBOARD 7.5X6 YLW CONV (MISCELLANEOUS) ×2 IMPLANT
PAD CAST 4YDX4 CTTN HI CHSV (CAST SUPPLIES) IMPLANT
PADDING CAST COTTON 4X4 STRL (CAST SUPPLIES)
SPONGE GAUZE 4X4 12PLY (GAUZE/BANDAGES/DRESSINGS) ×2 IMPLANT
SPONGE LAP 18X18 X RAY DECT (DISPOSABLE) ×2 IMPLANT
STAPLER VISISTAT 35W (STAPLE) ×2 IMPLANT
STOCKINETTE IMPERVIOUS LG (DRAPES) IMPLANT
SUCTION FRAZIER TIP 10 FR DISP (SUCTIONS) ×2 IMPLANT
SUT ETHILON 2 0 PSLX (SUTURE) ×4 IMPLANT
TOWEL OR 17X24 6PK STRL BLUE (TOWEL DISPOSABLE) ×2 IMPLANT
TOWEL OR 17X26 10 PK STRL BLUE (TOWEL DISPOSABLE) ×2 IMPLANT
TUBE CONNECTING 12X1/4 (SUCTIONS) ×2 IMPLANT
UNDERPAD 30X30 INCONTINENT (UNDERPADS AND DIAPERS) ×2 IMPLANT
WATER STERILE IRR 1000ML POUR (IV SOLUTION) IMPLANT

## 2013-04-25 NOTE — Discharge Summary (Signed)
Discharge to home in stable condition. Final diagnosis osteomyelitis left foot third toe.

## 2013-04-25 NOTE — Transfer of Care (Signed)
Immediate Anesthesia Transfer of Care Note  Patient: Gabriel Howe  Procedure(s) Performed: Procedure(s) with comments: AMPUTATION RAY (Left) - Left Foot 3rd Ray Amputation  Patient Location: PACU  Anesthesia Type:General  Level of Consciousness: awake, alert  and oriented  Airway & Oxygen Therapy: Patient Spontanous Breathing and Patient connected to nasal cannula oxygen  Post-op Assessment: Report given to PACU RN and Post -op Vital signs reviewed and stable  Post vital signs: Reviewed and stable  Complications: No apparent anesthesia complications

## 2013-04-25 NOTE — Progress Notes (Signed)
Pt has a moderate amount of bloody drainage after standing up to get dressed.  Dr Lajoyce Corners aware, and at bedside. Order to reinforce dsg. Additional dsg supplies sent home w/pt. He is comfortable reinforcing dsg at home, and agrees to contact Dr. Lajoyce Corners if drainage becomes excessive and/or continues overnight.

## 2013-04-25 NOTE — Op Note (Signed)
OPERATIVE REPORT  DATE OF SURGERY: 04/25/2013  PATIENT:  Gabriel Howe,  52 y.o. male  PRE-OPERATIVE DIAGNOSIS:  Left Foot 3rd Toe Osteomyelitis  POST-OPERATIVE DIAGNOSIS:  Left Foot 3rd Toe Osteomyelitis  PROCEDURE:  Procedure(s): AMPUTATION RAY  SURGEON:  Surgeon(s): Nadara Mustard, MD  ANESTHESIA:   general  EBL:  min ML  SPECIMEN:  No Specimen  TOURNIQUET:  * No tourniquets in log *  PROCEDURE DETAILS: Patient is a 52 year old gentleman with osteomyelitis left foot third toe he has failed conservative treatment and presents at this time for foot salvage surgery. Risks and benefits were discussed patient states he understands and wished to proceed at this time. Description of procedure patient brought to the operating room and underwent a general anesthetic. After adequate levels of anesthesia were obtained patient's left lower extremity was prepped using DuraPrep and draped into a sterile field. A racquet incision was made around the toe with a dorsal incision the metatarsal was resected through the metatarsal shaft and the metatarsal and toe were resected in one block of tissue. There is no deep purulence or abscess. Hemostasis was obtained the wound is irrigated with normal saline. The incision was closed using 2-0 nylon. The wound is covered Adaptic orthopedic sponges AB dressing Kerlix and Coban. Patient was extubated taken the PACU in stable condition plan for discharge to home.  PLAN OF CARE: Discharge to home after PACU  PATIENT DISPOSITION:  PACU - hemodynamically stable.   Nadara Mustard, MD 04/25/2013 8:10 AM

## 2013-04-25 NOTE — Anesthesia Preprocedure Evaluation (Addendum)
Anesthesia Evaluation  Patient identified by MRN, date of birth, ID band Patient awake    Reviewed: Allergy & Precautions, H&P , NPO status , Patient's Chart, lab work & pertinent test results  History of Anesthesia Complications Negative for: history of anesthetic complications  Airway Mallampati: II TM Distance: >3 FB Neck ROM: Full    Dental no notable dental hx. (+) Teeth Intact and Dental Advisory Given   Pulmonary neg pulmonary ROS,  breath sounds clear to auscultation  Pulmonary exam normal       Cardiovascular negative cardio ROS  Rhythm:Regular Rate:Normal     Neuro/Psych  Neuromuscular disease negative psych ROS   GI/Hepatic negative GI ROS, Neg liver ROS,   Endo/Other  diabetes, Type 1, Insulin Dependent  Renal/GU negative Renal ROS  negative genitourinary   Musculoskeletal   Abdominal   Peds  Hematology negative hematology ROS (+)   Anesthesia Other Findings   Reproductive/Obstetrics negative OB ROS                         Anesthesia Physical Anesthesia Plan  ASA: III  Anesthesia Plan: General   Post-op Pain Management:    Induction: Intravenous  Airway Management Planned: LMA  Additional Equipment:   Intra-op Plan:   Post-operative Plan: Extubation in OR  Informed Consent: I have reviewed the patients History and Physical, chart, labs and discussed the procedure including the risks, benefits and alternatives for the proposed anesthesia with the patient or authorized representative who has indicated his/her understanding and acceptance.   Dental advisory given  Plan Discussed with: CRNA  Anesthesia Plan Comments:         Anesthesia Quick Evaluation

## 2013-04-25 NOTE — Anesthesia Procedure Notes (Signed)
Procedure Name: LMA Insertion Date/Time: 04/25/2013 7:40 AM Performed by: Coralee Rud Pre-anesthesia Checklist: Patient identified, Emergency Drugs available, Suction available and Patient being monitored Patient Re-evaluated:Patient Re-evaluated prior to inductionOxygen Delivery Method: Circle system utilized Preoxygenation: Pre-oxygenation with 100% oxygen Intubation Type: IV induction Ventilation: Mask ventilation without difficulty LMA: LMA with gastric port inserted LMA Size: 5.0 Number of attempts: 1 Placement Confirmation: positive ETCO2 and breath sounds checked- equal and bilateral Dental Injury: Teeth and Oropharynx as per pre-operative assessment

## 2013-04-25 NOTE — Progress Notes (Signed)
Orthopedic Tech Progress Note Patient Details:  Gabriel Howe April 10, 1961 [id number] Applied post-op shoe to LLE. Ortho Devices Type of Ortho Device: Postop shoe/boot Ortho Device/Splint Location: LLE Ortho Device/Splint Interventions: Application   Lesle Chris 04/25/2013, 8:57 AM

## 2013-04-25 NOTE — Preoperative (Signed)
Beta Blockers   Reason not to administer Beta Blockers:Not Applicable 

## 2013-04-25 NOTE — Anesthesia Postprocedure Evaluation (Signed)
  Anesthesia Post-op Note  Patient: Gabriel Howe  Procedure(s) Performed: Procedure(s) with comments: AMPUTATION RAY (Left) - Left Foot 3rd Ray Amputation  Patient Location: PACU  Anesthesia Type:General  Level of Consciousness: awake, alert  and oriented  Airway and Oxygen Therapy: Patient Spontanous Breathing  Post-op Pain: none  Post-op Assessment: Post-op Vital signs reviewed, Patient's Cardiovascular Status Stable, Respiratory Function Stable, Patent Airway and No signs of Nausea or vomiting  Post-op Vital Signs: Reviewed and stable  Complications: No apparent anesthesia complications

## 2013-04-25 NOTE — H&P (Signed)
Gabriel Howe is an 52 y.o. male.   Chief Complaint: Osteomyelitis left foot third toe HPI: Patient is a 52 year old gentleman with peripheral vascular disease diabetes who has had ulceration of the left foot third toe patient has failed conservative treatment.  Past Medical History  Diagnosis Date  . Bronchitis     hx of;last time about 49yrs ago  . Neuromuscular disorder     diabetic neuropathy in feet  . Arthritis   . Hyperlipidemia     takes Atorvastatin daily  . Diabetes mellitus without complication     takes Metformin bid and Lantus nightly    Past Surgical History  Procedure Laterality Date  . Toe amputation      at [facility], and removal of partial of second to 2012  . Tonsillectomy    . Amputation Right 01/02/2013    Procedure: AMPUTATION BELOW KNEE;  Surgeon: Nadara Mustard, MD;  Location: [facility] OR;  Service: Orthopedics;  Laterality: Right;  Right Below Knee Amputation  . Esophagogastroduodenoscopy      History reviewed. No pertinent family history. Social History:  reports that he has quit smoking. He does not have any smokeless tobacco history on file. He reports that he does not drink alcohol or use illicit drugs.  Allergies:  Allergies  Allergen Reactions  . Bactroban (Mupirocin Calcium) Hives  . Shellfish Allergy     rash    Medications Prior to Admission  Medication Sig Dispense Refill  . atorvastatin (LIPITOR) 10 MG tablet Take 10 mg by mouth daily.      . ciprofloxacin (CIPRO) 500 MG tablet Take 500 mg by mouth 2 (two) times daily.      . insulin glargine (LANTUS) 100 UNIT/ML injection Inject 20 Units into the skin at bedtime.  10 mL  12  . metFORMIN (GLUCOPHAGE) 500 MG tablet Take 500 mg by mouth 2 (two) times daily.        No results found for this or any previous visit (from the past 48 hour(s)). No results found.  Review of Systems  All other systems reviewed and are negative.    Blood pressure 126/85, pulse 89, temperature 97.4 F (36.3 C),  temperature source Oral, resp. rate 20, height 6\' 9"  (2.057 m), weight 154.223 kg (340 lb), SpO2 95.00%. Physical Exam  On examination patient has palpable pulses. He has ulceration osteomyelitis abscess left foot third toe Assessment/Plan Assessment: Ulceration and osteomyelitis abscess left foot third toe.  Plan: We'll plan for left foot third ray amputation. Risks and benefits were discussed including infection neurovascular injury nonhealing of the wound need for additional surgery. Patient states he understands and wished to proceed at this time.  Gabriel Howe V 04/25/2013, 6:56 AM

## 2013-04-28 ENCOUNTER — Encounter (HOSPITAL_COMMUNITY): Payer: Self-pay | Admitting: Orthopedic Surgery

## 2013-05-18 ENCOUNTER — Ambulatory Visit: Payer: BC Managed Care – PPO | Attending: Orthopedic Surgery | Admitting: Physical Therapy

## 2013-05-18 DIAGNOSIS — R5381 Other malaise: Secondary | ICD-10-CM | POA: Insufficient documentation

## 2013-05-18 DIAGNOSIS — M6281 Muscle weakness (generalized): Secondary | ICD-10-CM | POA: Insufficient documentation

## 2013-05-18 DIAGNOSIS — Z5189 Encounter for other specified aftercare: Secondary | ICD-10-CM | POA: Insufficient documentation

## 2013-05-18 DIAGNOSIS — R269 Unspecified abnormalities of gait and mobility: Secondary | ICD-10-CM | POA: Insufficient documentation

## 2013-05-18 DIAGNOSIS — S88119A Complete traumatic amputation at level between knee and ankle, unspecified lower leg, initial encounter: Secondary | ICD-10-CM | POA: Insufficient documentation

## 2013-05-25 ENCOUNTER — Ambulatory Visit: Payer: BC Managed Care – PPO | Admitting: Physical Therapy

## 2013-05-27 ENCOUNTER — Ambulatory Visit: Payer: BC Managed Care – PPO | Admitting: Physical Therapy

## 2013-06-01 ENCOUNTER — Ambulatory Visit: Payer: BC Managed Care – PPO | Attending: Orthopedic Surgery | Admitting: Physical Therapy

## 2013-06-01 DIAGNOSIS — Z5189 Encounter for other specified aftercare: Secondary | ICD-10-CM | POA: Insufficient documentation

## 2013-06-01 DIAGNOSIS — R269 Unspecified abnormalities of gait and mobility: Secondary | ICD-10-CM | POA: Insufficient documentation

## 2013-06-01 DIAGNOSIS — S88119A Complete traumatic amputation at level between knee and ankle, unspecified lower leg, initial encounter: Secondary | ICD-10-CM | POA: Insufficient documentation

## 2013-06-01 DIAGNOSIS — M6281 Muscle weakness (generalized): Secondary | ICD-10-CM | POA: Insufficient documentation

## 2013-06-01 DIAGNOSIS — R5381 Other malaise: Secondary | ICD-10-CM | POA: Insufficient documentation

## 2013-06-03 ENCOUNTER — Ambulatory Visit: Payer: BC Managed Care – PPO | Admitting: Physical Therapy

## 2013-06-08 ENCOUNTER — Encounter: Payer: BC Managed Care – PPO | Admitting: Physical Therapy

## 2013-06-10 ENCOUNTER — Ambulatory Visit: Payer: BC Managed Care – PPO | Admitting: Physical Therapy

## 2013-06-13 IMAGING — CR DG CHEST 2V
2 series · 2 of 2 positions shown · non-contrast
Comparison: No priors.

CLINICAL DATA: Nonhealing surgical wound.  Pre hyperbaric chamber
assessment.

CHEST - 2 VIEW

[w chest pa]
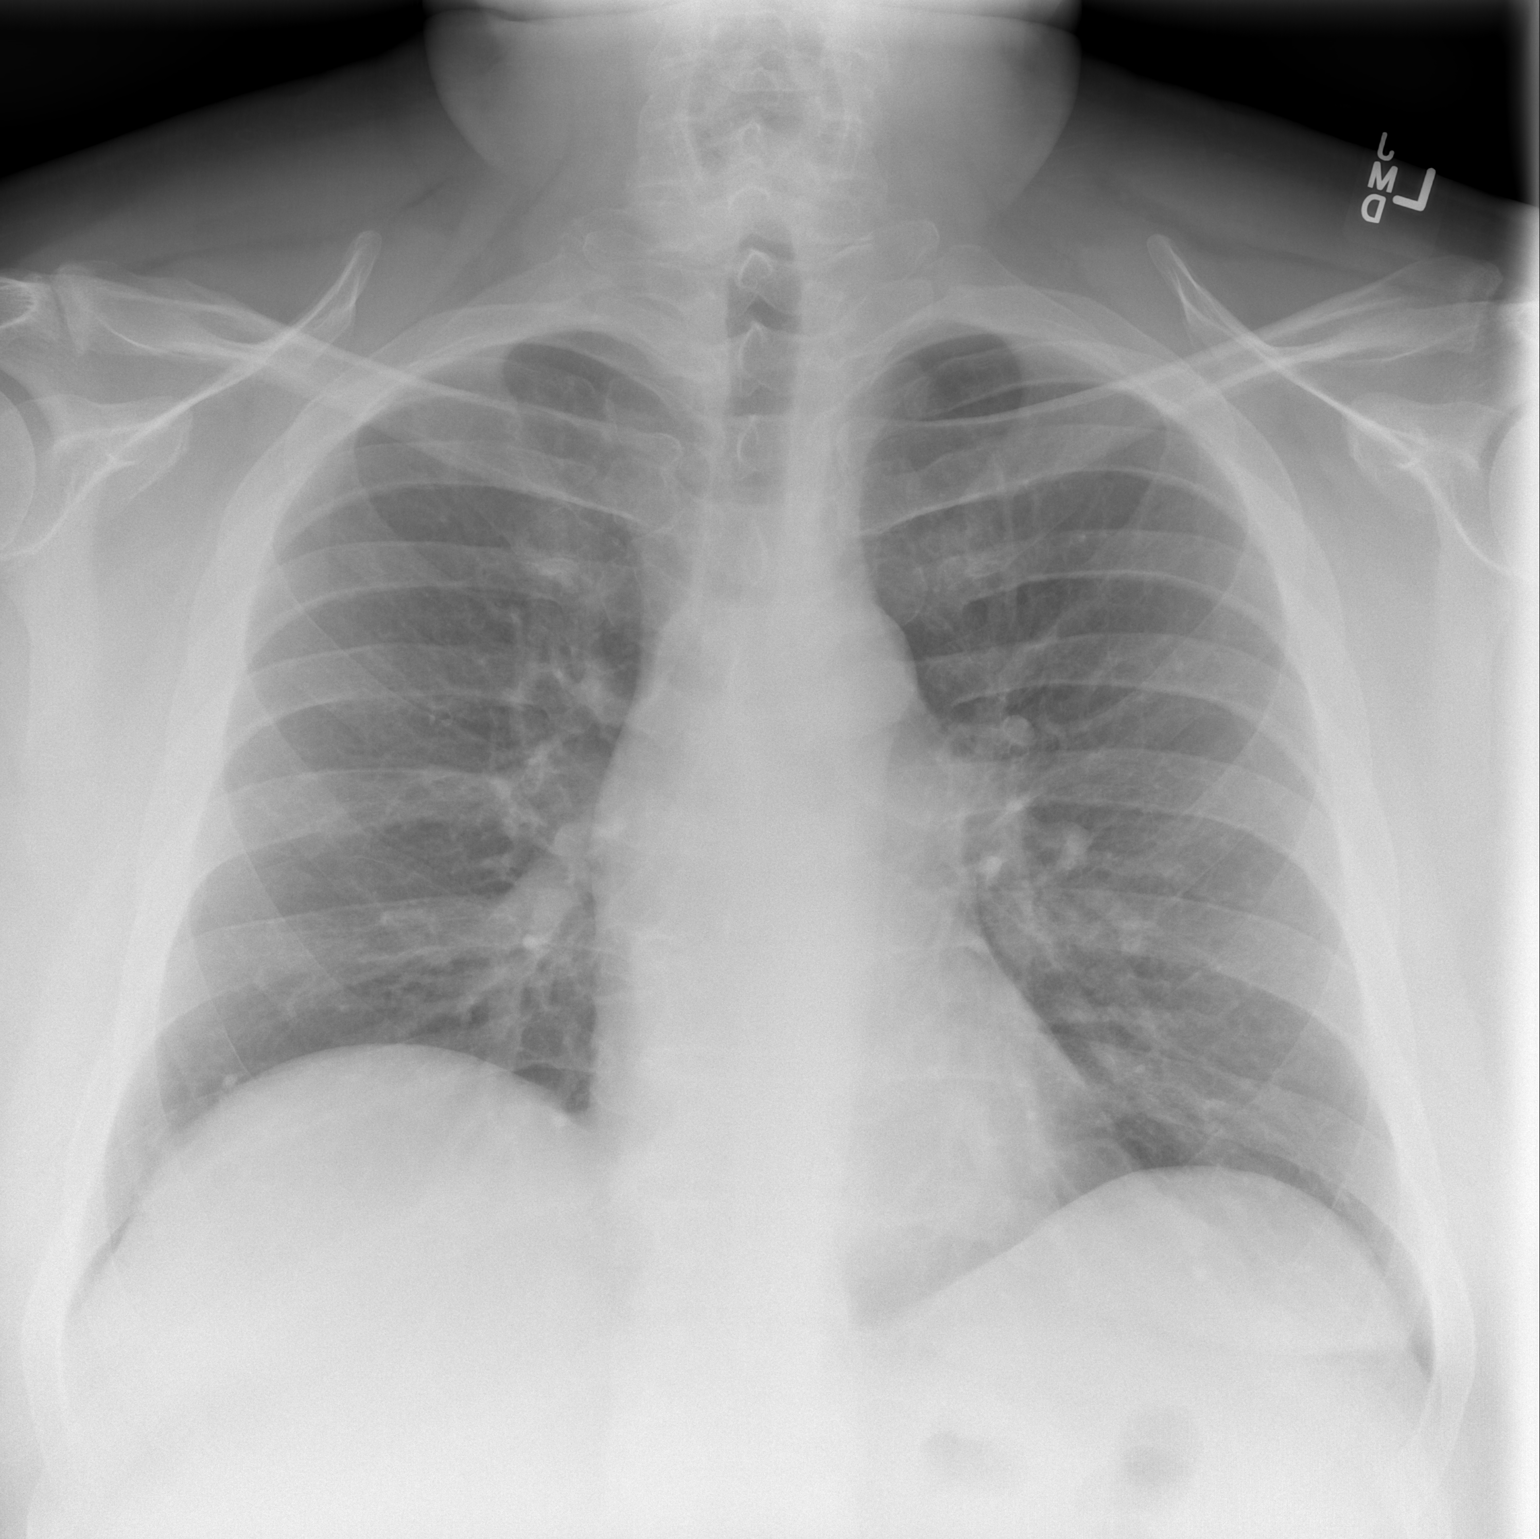

[w chest lat *]
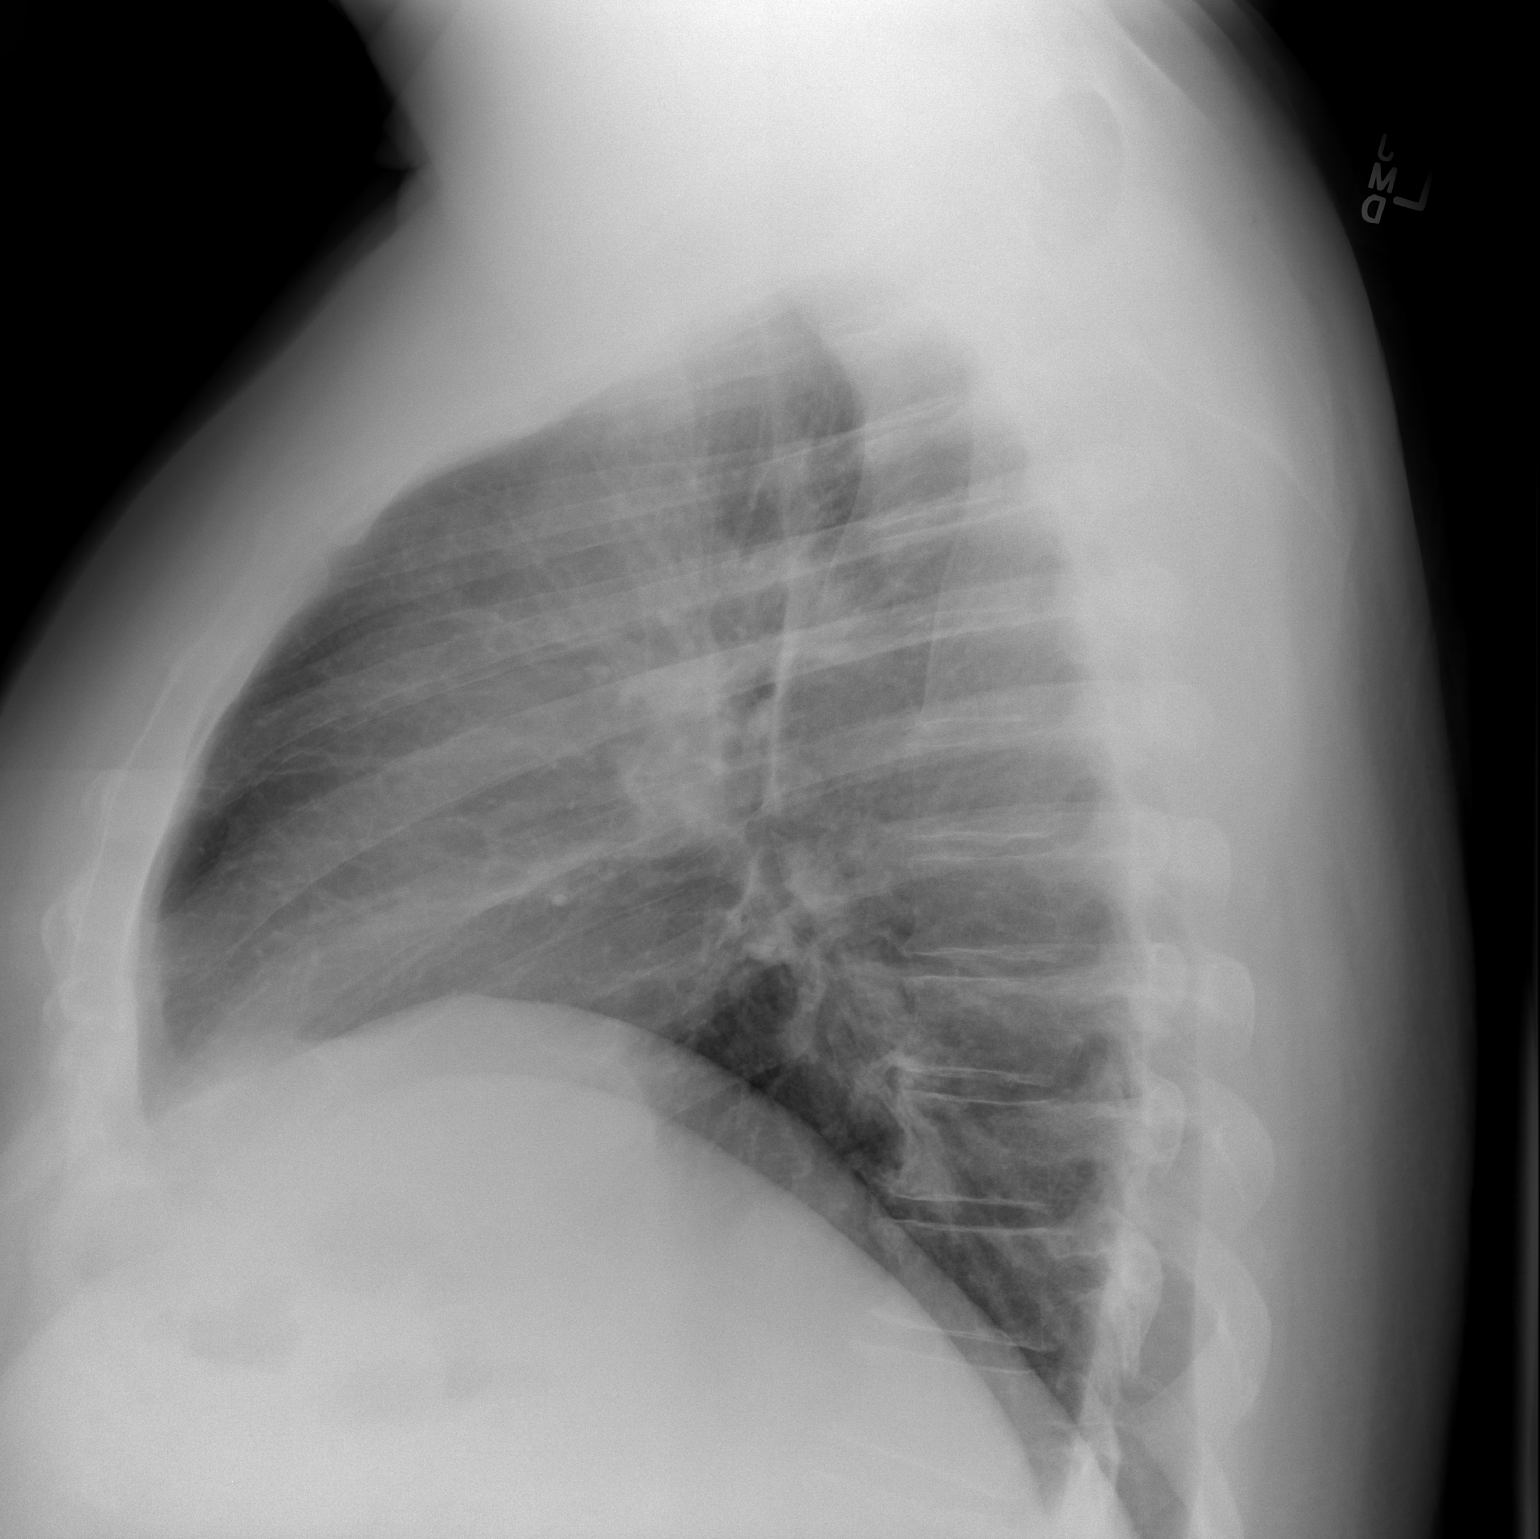

[2 of 2 positions shown; findings below may reference images not displayed]

FINDINGS: Lung volumes are normal.  No consolidative airspace
disease.  No pleural effusions.  No pneumothorax.  No pulmonary
nodule or mass noted.  Pulmonary vasculature and the
cardiomediastinal silhouette are within normal limits.
IMPRESSION: 1. No radiographic evidence of acute cardiopulmonary disease.

## 2013-06-13 IMAGING — CR DG FOOT COMPLETE 3+V*L*
3 series · 3 of 3 positions shown · non-contrast
Comparison: No priors.

CLINICAL DATA: Nonhealing wound.

LEFT FOOT - COMPLETE 3+ VIEW

[t foot ap left *]
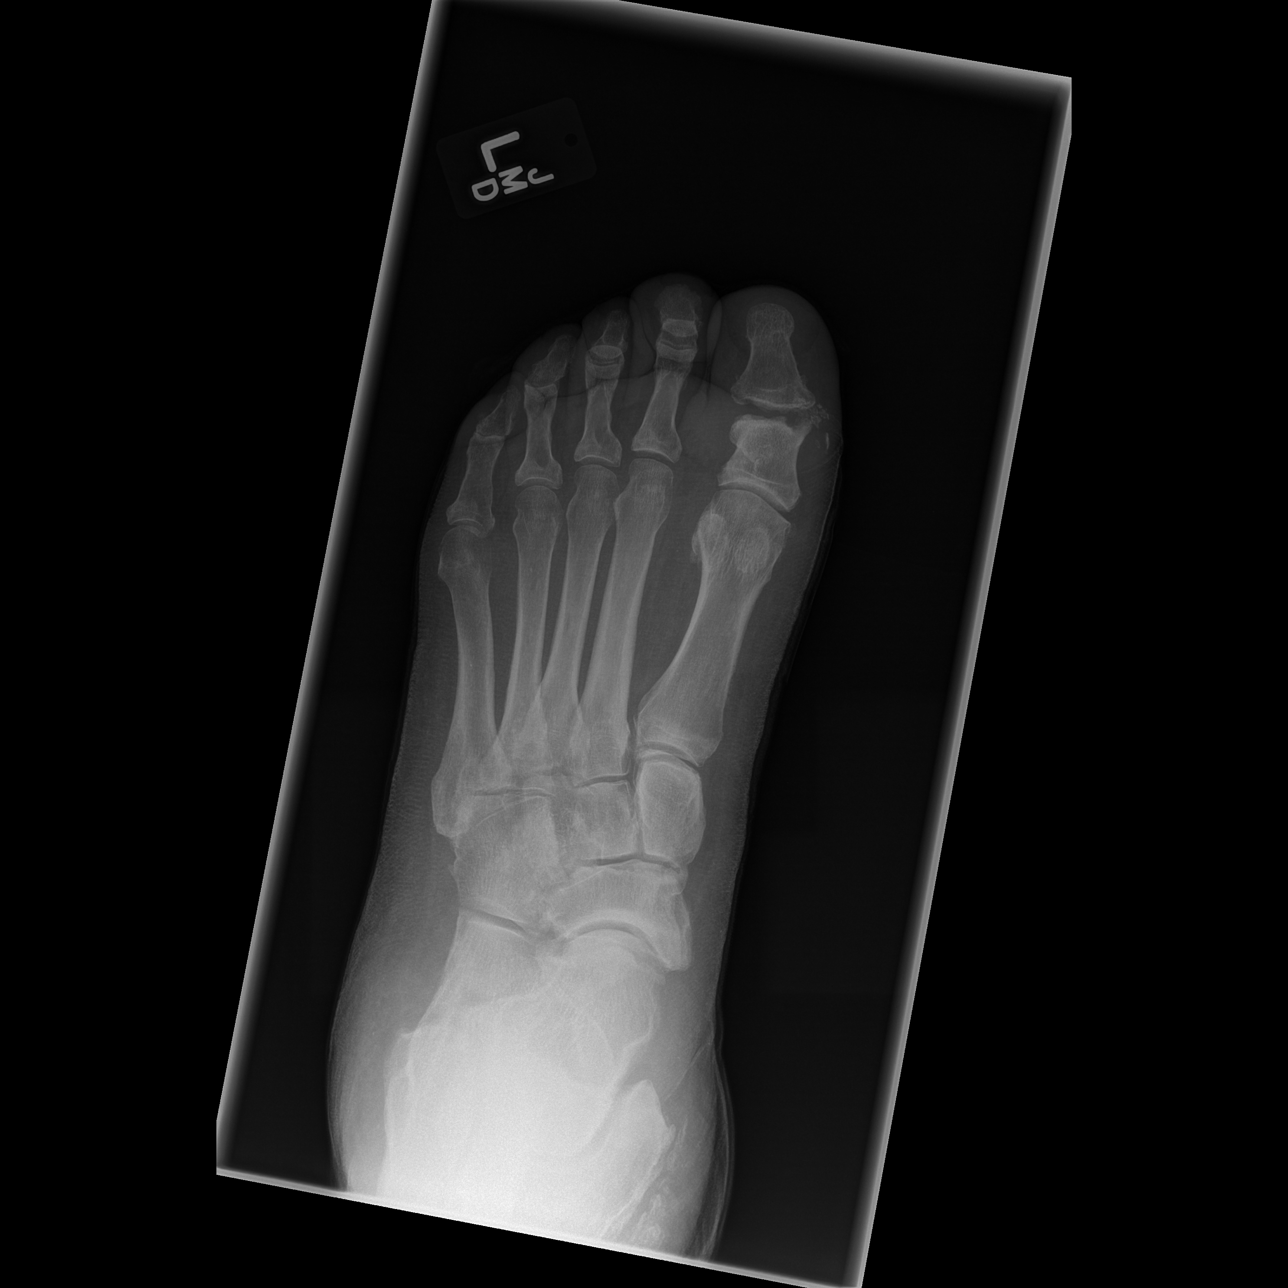

[t foot oblique left *]
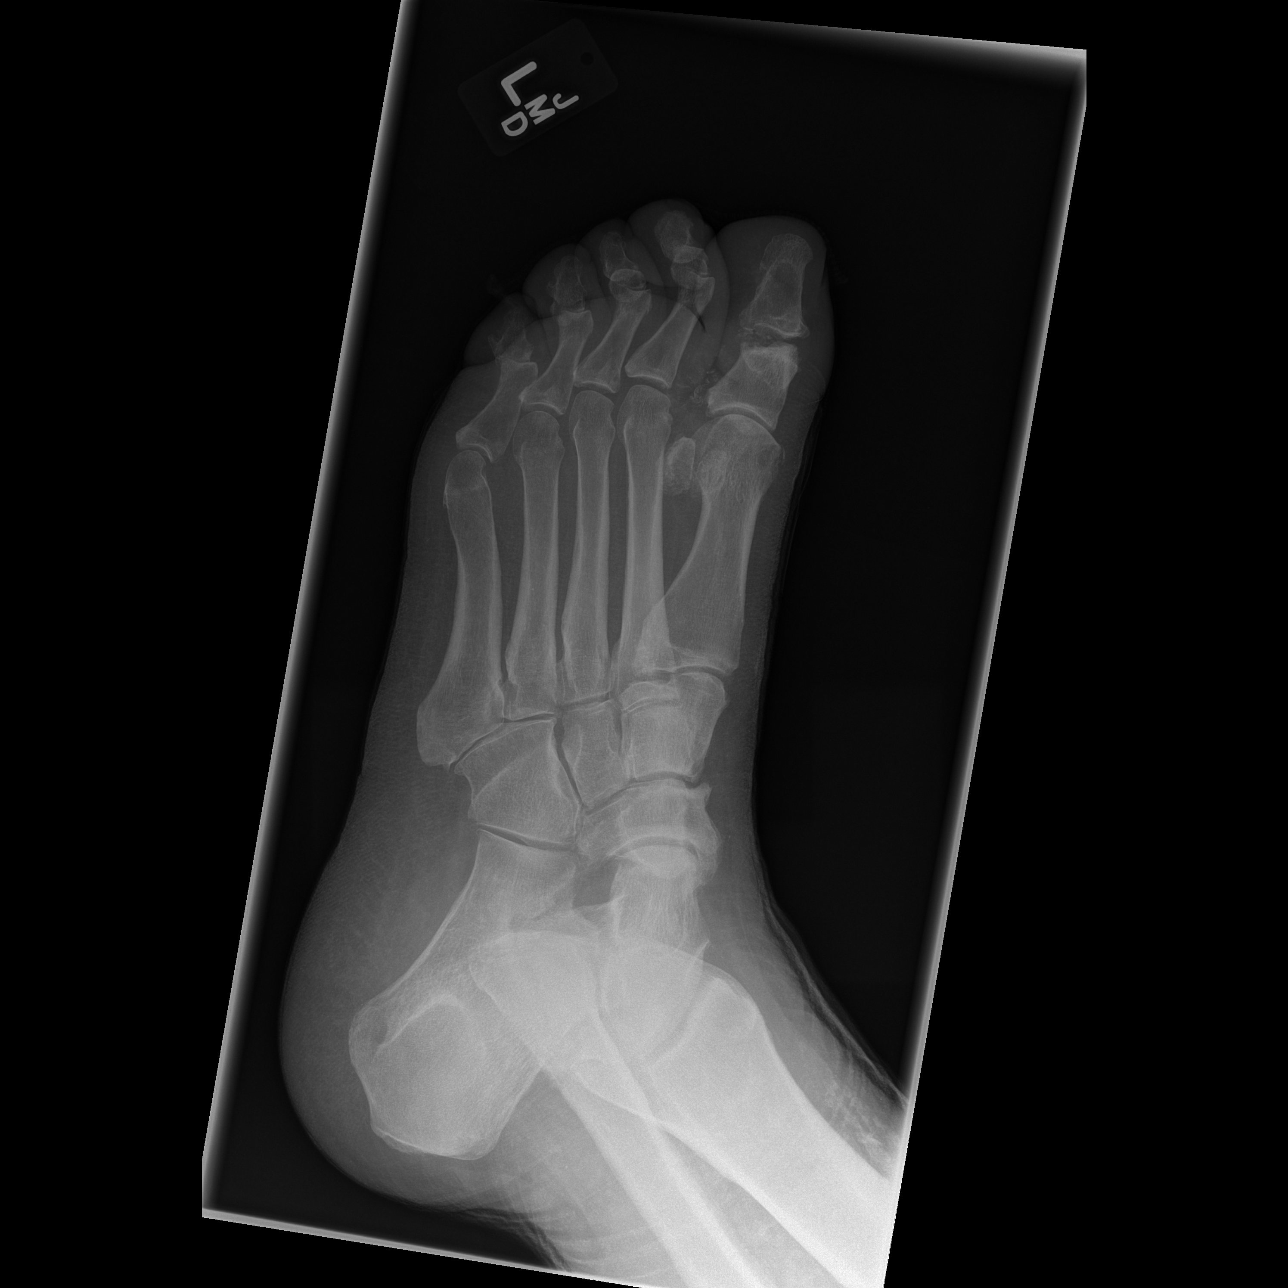

[t foot lat left *]
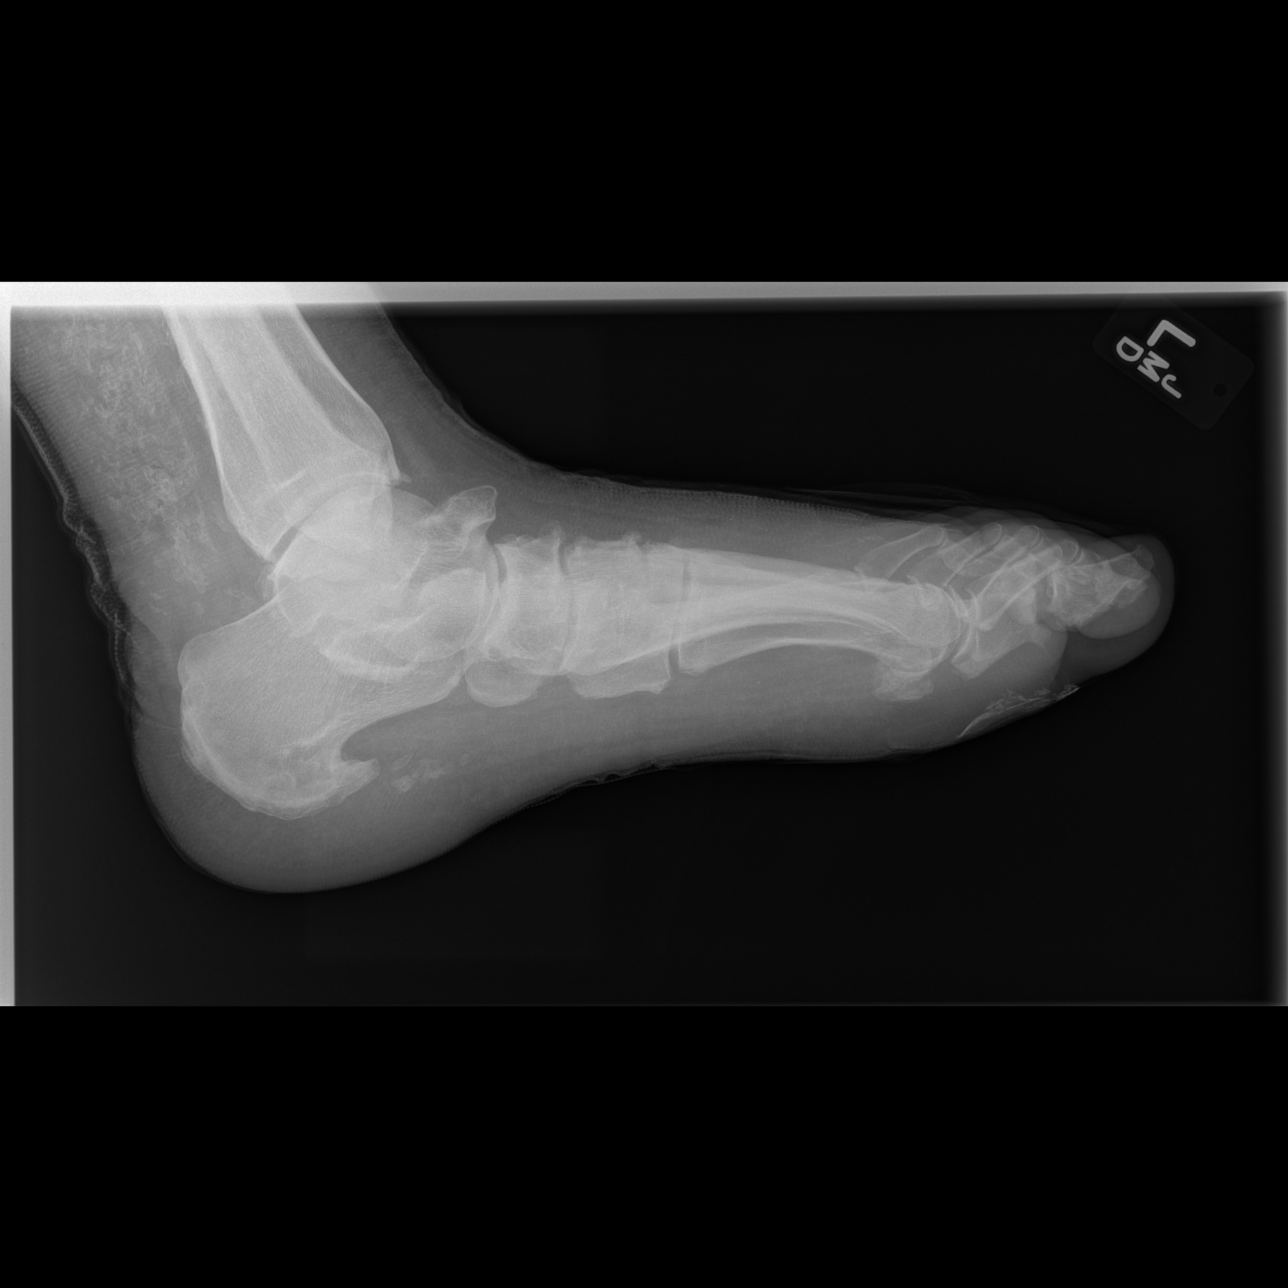

[3 of 3 positions shown; findings below may reference images not displayed]

FINDINGS: There is sclerosis and irregularity of the distal aspect
of the first proximal phalanx where there is an erosion along the
medial aspect of the joint surface at the first interphalangeal
joint. In addition, on the oblique view there is a apparent erosion
of the medial aspect of the head of the first metatarsal.  Some
heterotopic ossification is noted in the adjacent soft tissues.
Soft tissues are markedly swollen.  No other definite region of
frank osteolysis is noted.  A small plantar calcaneal spur is
noted.  Degenerative changes of osteoarthritis are noted,
predominately throughout the midfoot.  On the lateral view there is
some calcification or ossification in the soft tissues posterior to
the distal tibia and fibula.
IMPRESSION: 1.  Degenerative changes in the great toe with multiple erosions,
as detailed above, suspicious for inflammatory arthropathy such as
gout.
2.  Profound heterotopic ossification in the soft tissues posterior
to the distal tibia and fibula, as possibly from myositis
ossificans.
3.  Small plantar calcaneal spur.

## 2013-06-16 ENCOUNTER — Ambulatory Visit: Payer: BC Managed Care – PPO | Admitting: Physical Therapy

## 2013-06-18 ENCOUNTER — Encounter: Payer: BC Managed Care – PPO | Admitting: Physical Therapy

## 2013-06-22 ENCOUNTER — Ambulatory Visit: Payer: BC Managed Care – PPO | Admitting: Physical Therapy

## 2013-06-24 ENCOUNTER — Encounter: Payer: BC Managed Care – PPO | Admitting: Physical Therapy

## 2013-06-30 ENCOUNTER — Encounter: Payer: BC Managed Care – PPO | Admitting: Physical Therapy

## 2013-07-02 ENCOUNTER — Encounter: Payer: BC Managed Care – PPO | Admitting: Physical Therapy

## 2013-08-01 ENCOUNTER — Inpatient Hospital Stay (HOSPITAL_COMMUNITY)
Admission: EM | Admit: 2013-08-01 | Discharge: 2013-08-05 | DRG: 415 | Disposition: A | Discharge status: Home / Self Care | Payer: BC Managed Care – PPO | Attending: Internal Medicine | Admitting: Internal Medicine

## 2013-08-01 ENCOUNTER — Emergency Department (HOSPITAL_COMMUNITY): Payer: BC Managed Care – PPO

## 2013-08-01 ENCOUNTER — Encounter (HOSPITAL_COMMUNITY): Payer: Self-pay

## 2013-08-01 DIAGNOSIS — S88119A Complete traumatic amputation at level between knee and ankle, unspecified lower leg, initial encounter: Secondary | ICD-10-CM

## 2013-08-01 DIAGNOSIS — I96 Gangrene, not elsewhere classified: Secondary | ICD-10-CM | POA: Diagnosis present

## 2013-08-01 DIAGNOSIS — L03032 Cellulitis of left toe: Secondary | ICD-10-CM | POA: Diagnosis present

## 2013-08-01 DIAGNOSIS — D72829 Elevated white blood cell count, unspecified: Secondary | ICD-10-CM | POA: Diagnosis present

## 2013-08-01 DIAGNOSIS — E876 Hypokalemia: Secondary | ICD-10-CM | POA: Diagnosis present

## 2013-08-01 DIAGNOSIS — M9080 Osteopathy in diseases classified elsewhere, unspecified site: Secondary | ICD-10-CM | POA: Diagnosis present

## 2013-08-01 DIAGNOSIS — E1169 Type 2 diabetes mellitus with other specified complication: Secondary | ICD-10-CM

## 2013-08-01 DIAGNOSIS — E1165 Type 2 diabetes mellitus with hyperglycemia: Secondary | ICD-10-CM | POA: Diagnosis present

## 2013-08-01 DIAGNOSIS — M869 Osteomyelitis, unspecified: Secondary | ICD-10-CM

## 2013-08-01 DIAGNOSIS — Z87891 Personal history of nicotine dependence: Secondary | ICD-10-CM

## 2013-08-01 DIAGNOSIS — E871 Hypo-osmolality and hyponatremia: Secondary | ICD-10-CM | POA: Diagnosis present

## 2013-08-01 DIAGNOSIS — A419 Sepsis, unspecified organism: Principal | ICD-10-CM | POA: Diagnosis present

## 2013-08-01 DIAGNOSIS — E785 Hyperlipidemia, unspecified: Secondary | ICD-10-CM | POA: Diagnosis present

## 2013-08-01 DIAGNOSIS — Z794 Long term (current) use of insulin: Secondary | ICD-10-CM

## 2013-08-01 DIAGNOSIS — I959 Hypotension, unspecified: Secondary | ICD-10-CM | POA: Diagnosis present

## 2013-08-01 DIAGNOSIS — M79609 Pain in unspecified limb: Secondary | ICD-10-CM

## 2013-08-01 DIAGNOSIS — L02419 Cutaneous abscess of limb, unspecified: Secondary | ICD-10-CM | POA: Diagnosis present

## 2013-08-01 DIAGNOSIS — L02612 Cutaneous abscess of left foot: Secondary | ICD-10-CM | POA: Diagnosis present

## 2013-08-01 DIAGNOSIS — E119 Type 2 diabetes mellitus without complications: Secondary | ICD-10-CM | POA: Diagnosis present

## 2013-08-01 DIAGNOSIS — E11621 Type 2 diabetes mellitus with foot ulcer: Secondary | ICD-10-CM

## 2013-08-01 DIAGNOSIS — Z6841 Body Mass Index (BMI) 40.0 and over, adult: Secondary | ICD-10-CM

## 2013-08-01 DIAGNOSIS — S98139A Complete traumatic amputation of one unspecified lesser toe, initial encounter: Secondary | ICD-10-CM

## 2013-08-01 DIAGNOSIS — IMO0002 Reserved for concepts with insufficient information to code with codable children: Secondary | ICD-10-CM | POA: Diagnosis present

## 2013-08-01 DIAGNOSIS — E1142 Type 2 diabetes mellitus with diabetic polyneuropathy: Secondary | ICD-10-CM | POA: Diagnosis present

## 2013-08-01 DIAGNOSIS — L97509 Non-pressure chronic ulcer of other part of unspecified foot with unspecified severity: Secondary | ICD-10-CM | POA: Diagnosis present

## 2013-08-01 DIAGNOSIS — E1140 Type 2 diabetes mellitus with diabetic neuropathy, unspecified: Secondary | ICD-10-CM

## 2013-08-01 DIAGNOSIS — L02619 Cutaneous abscess of unspecified foot: Secondary | ICD-10-CM

## 2013-08-01 DIAGNOSIS — M129 Arthropathy, unspecified: Secondary | ICD-10-CM | POA: Diagnosis present

## 2013-08-01 LAB — URINALYSIS, ROUTINE W REFLEX MICROSCOPIC
Ketones, ur: 15 mg/dL — AB
Leukocytes, UA: NEGATIVE
Nitrite: NEGATIVE
Protein, ur: 30 mg/dL — AB
Urobilinogen, UA: 1 mg/dL (ref 0.0–1.0)

## 2013-08-01 LAB — PRO B NATRIURETIC PEPTIDE: Pro B Natriuretic peptide (BNP): 100.2 pg/mL (ref 0–125)

## 2013-08-01 LAB — CBC WITH DIFFERENTIAL/PLATELET
Eosinophils Absolute: 0 10*3/uL (ref 0.0–0.7)
Eosinophils Relative: 0 % (ref 0–5)
Hemoglobin: 13.1 g/dL (ref 13.0–17.0)
Lymphocytes Relative: 14 % (ref 12–46)
Lymphs Abs: 1.9 10*3/uL (ref 0.7–4.0)
MCH: 28.1 pg (ref 26.0–34.0)
MCV: 78.2 fL (ref 78.0–100.0)
Monocytes Relative: 11 % (ref 3–12)
Neutrophils Relative %: 75 % (ref 43–77)
Platelets: 237 10*3/uL (ref 150–400)
RBC: 4.67 MIL/uL (ref 4.22–5.81)
WBC: 13.4 10*3/uL — ABNORMAL HIGH (ref 4.0–10.5)

## 2013-08-01 LAB — COMPREHENSIVE METABOLIC PANEL
ALT: 7 U/L (ref 0–53)
Alkaline Phosphatase: 88 U/L (ref 39–117)
BUN: 11 mg/dL (ref 6–23)
CO2: 22 mEq/L (ref 19–32)
GFR calc Af Amer: >90 mL/min (ref >90)
GFR calc non Af Amer: >90 mL/min (ref >90)
Glucose, Bld: 245 mg/dL — ABNORMAL HIGH (ref 70–99)
Potassium: 3.4 mEq/L — ABNORMAL LOW (ref 3.5–5.1)
Sodium: 128 mEq/L — ABNORMAL LOW (ref 135–145)
Total Protein: 8.4 g/dL — ABNORMAL HIGH (ref 6.0–8.3)

## 2013-08-01 LAB — CG4 I-STAT (LACTIC ACID): Lactic Acid, Venous: 2.21 mmol/L — ABNORMAL HIGH (ref 0.5–2.2)

## 2013-08-01 LAB — GLUCOSE, CAPILLARY: Glucose-Capillary: 184 mg/dL — ABNORMAL HIGH (ref 70–99)

## 2013-08-01 LAB — TROPONIN I: Troponin I: <0.3 ng/mL (ref <0.30)

## 2013-08-01 MED ORDER — HEPARIN SODIUM (PORCINE) 5000 UNIT/ML IJ SOLN
5000.0000 [IU] | Freq: Three times a day (TID) | INTRAMUSCULAR | Status: DC
  Administered 2013-08-01 – 2013-08-05 (×10): 5000 [IU] via SUBCUTANEOUS
  Filled 2013-08-01 (×16): qty 1

## 2013-08-01 MED ORDER — VANCOMYCIN HCL 10 G IV SOLR
1500.0000 mg | Freq: Two times a day (BID) | INTRAVENOUS | Status: DC
  Administered 2013-08-02 – 2013-08-03 (×4): 1500 mg via INTRAVENOUS
  Filled 2013-08-01 (×5): qty 1500

## 2013-08-01 MED ORDER — SODIUM CHLORIDE 0.9 % IV SOLN
INTRAVENOUS | Status: DC
  Administered 2013-08-01 – 2013-08-02 (×3): via INTRAVENOUS

## 2013-08-01 MED ORDER — POLYETHYLENE GLYCOL 3350 17 G PO PACK
17.0000 g | PACK | Freq: Every day | ORAL | Status: DC | PRN
  Filled 2013-08-01: qty 1

## 2013-08-01 MED ORDER — ATORVASTATIN CALCIUM 10 MG PO TABS
10.0000 mg | ORAL_TABLET | Freq: Every day | ORAL | Status: DC
  Administered 2013-08-01 – 2013-08-04 (×4): 10 mg via ORAL
  Filled 2013-08-01 (×6): qty 1

## 2013-08-01 MED ORDER — VANCOMYCIN HCL 10 G IV SOLR
2000.0000 mg | Freq: Once | INTRAVENOUS | Status: AC
  Administered 2013-08-01: 2000 mg via INTRAVENOUS
  Filled 2013-08-01: qty 2000

## 2013-08-01 MED ORDER — INSULIN ASPART 100 UNIT/ML ~~LOC~~ SOLN: 0.0000 [IU] | Freq: Three times a day (TID) | SUBCUTANEOUS | Status: DC

## 2013-08-01 MED ORDER — ONDANSETRON HCL 4 MG/2ML IJ SOLN: 4.0000 mg | Freq: Three times a day (TID) | INTRAMUSCULAR | Status: DC | PRN

## 2013-08-01 MED ORDER — INSULIN ASPART 100 UNIT/ML ~~LOC~~ SOLN
0.0000 [IU] | SUBCUTANEOUS | Status: DC
  Administered 2013-08-01 – 2013-08-02 (×5): 2 [IU] via SUBCUTANEOUS

## 2013-08-01 MED ORDER — SODIUM CHLORIDE 0.9 % IV BOLUS (SEPSIS)
1000.0000 mL | Freq: Once | INTRAVENOUS | Status: AC
  Administered 2013-08-01: 1000 mL via INTRAVENOUS

## 2013-08-01 MED ORDER — PIPERACILLIN-TAZOBACTAM 3.375 G IVPB
3.3750 g | Freq: Three times a day (TID) | INTRAVENOUS | Status: DC
  Administered 2013-08-01: 3.375 g via INTRAVENOUS
  Filled 2013-08-01 (×3): qty 50

## 2013-08-01 MED ORDER — ACETAMINOPHEN 650 MG RE SUPP: 650.0000 mg | Freq: Four times a day (QID) | RECTAL | Status: DC | PRN

## 2013-08-01 MED ORDER — ACETAMINOPHEN 325 MG PO TABS: 650.0000 mg | ORAL_TABLET | Freq: Four times a day (QID) | ORAL | Status: DC | PRN

## 2013-08-01 MED ORDER — PIPERACILLIN-TAZOBACTAM 3.375 G IVPB 30 MIN: 3.3750 g | Freq: Once | INTRAVENOUS | Status: DC

## 2013-08-01 MED ORDER — MORPHINE SULFATE 2 MG/ML IJ SOLN: 2.0000 mg | INTRAMUSCULAR | Status: DC | PRN

## 2013-08-01 MED ORDER — SODIUM CHLORIDE 0.9 % IV SOLN
Freq: Once | INTRAVENOUS | Status: AC
  Administered 2013-08-01: 15:00:00 via INTRAVENOUS

## 2013-08-01 MED ORDER — INSULIN GLARGINE 100 UNIT/ML ~~LOC~~ SOLN
20.0000 [IU] | Freq: Every day | SUBCUTANEOUS | Status: DC
  Administered 2013-08-01: 20 [IU] via SUBCUTANEOUS
  Filled 2013-08-01 (×2): qty 0.2

## 2013-08-01 MED ORDER — ALBUTEROL SULFATE (5 MG/ML) 0.5% IN NEBU
2.5000 mg | INHALATION_SOLUTION | Freq: Four times a day (QID) | RESPIRATORY_TRACT | Status: DC
  Administered 2013-08-01: 2.5 mg via RESPIRATORY_TRACT
  Filled 2013-08-01: qty 0.5

## 2013-08-01 MED ORDER — PIPERACILLIN-TAZOBACTAM 3.375 G IVPB
3.3750 g | Freq: Three times a day (TID) | INTRAVENOUS | Status: DC
  Administered 2013-08-02 – 2013-08-05 (×10): 3.375 g via INTRAVENOUS
  Filled 2013-08-01 (×14): qty 50

## 2013-08-01 MED ORDER — VANCOMYCIN HCL 10 G IV SOLR
1500.0000 mg | Freq: Two times a day (BID) | INTRAVENOUS | Status: DC
  Filled 2013-08-01: qty 1500

## 2013-08-01 MED ORDER — ONDANSETRON HCL 4 MG PO TABS: 4.0000 mg | ORAL_TABLET | Freq: Four times a day (QID) | ORAL | Status: DC | PRN

## 2013-08-01 MED ORDER — ONDANSETRON HCL 4 MG/2ML IJ SOLN: 4.0000 mg | Freq: Four times a day (QID) | INTRAMUSCULAR | Status: DC | PRN

## 2013-08-01 MED ORDER — SODIUM CHLORIDE 0.9 % IJ SOLN
3.0000 mL | Freq: Two times a day (BID) | INTRAMUSCULAR | Status: DC
  Administered 2013-08-01 – 2013-08-03 (×4): 3 mL via INTRAVENOUS

## 2013-08-01 MED ORDER — ASPIRIN EC 81 MG PO TBEC
81.0000 mg | DELAYED_RELEASE_TABLET | Freq: Every day | ORAL | Status: DC
  Administered 2013-08-01 – 2013-08-05 (×4): 81 mg via ORAL
  Filled 2013-08-01 (×5): qty 1

## 2013-08-01 NOTE — Progress Notes (Signed)
VASCULAR LAB PRELIMINARY  PRELIMINARY  PRELIMINARY  PRELIMINARY  Bilateral lower extremity venous duplex completed.    Preliminary report:  Right  - No evidence of DVT or superficial thrombus of the upper leg. Patient is a BKA amputee. Left - No evidence of DVT, superficial thrombosis or Baker's cyst  Jennell Janosik, RVS 08/01/2013, 12:46 PM

## 2013-08-01 NOTE — Progress Notes (Signed)
Will get mri with contrast to better delineate level of amputation needed

## 2013-08-01 NOTE — ED Notes (Signed)
Patient transported to X-ray 

## 2013-08-01 NOTE — Consult Note (Addendum)
Reason for Consult:left leg surgery Referring Physician: nicole pisciotta  Ramirez Fullbright is an 52 y.o. male.  HPI: left foot infection - for OR next Tuesday by Lajoyce Corners - felt ill starting Wednesday - sob with ambulation - f/c present - on doxy since march - h/o iddm but he doesn't check cbgs   Past Medical History  Diagnosis Date  . Bronchitis     hx of;last time about 4yrs ago  . Neuromuscular disorder     diabetic neuropathy in feet  . Arthritis   . Hyperlipidemia     takes Atorvastatin daily  . Diabetes mellitus without complication     takes Metformin bid and Lantus nightly    Past Surgical History  Procedure Laterality Date  . Toe amputation      at [facility], and removal of partial of second to 2012  . Tonsillectomy    . Amputation Right 01/02/2013    Procedure: AMPUTATION BELOW KNEE;  Surgeon: Nadara Mustard, MD;  Location: [facility] OR;  Service: Orthopedics;  Laterality: Right;  Right Below Knee Amputation  . Esophagogastroduodenoscopy    . Amputation Left 04/25/2013    Procedure: AMPUTATION RAY;  Surgeon: Nadara Mustard, MD;  Location: [facility] OR;  Service: Orthopedics;  Laterality: Left;  Left Foot 3rd Ray Amputation    Family History  Problem Relation Age of Onset  . Other Mother   . Other Father     Social History:  reports that he has quit smoking. He does not have any smokeless tobacco history on file. He reports that he does not drink alcohol or use illicit drugs.  Allergies:  Allergies  Allergen Reactions  . Bactroban [Mupirocin Calcium] Hives  . Shellfish Allergy Rash    Medications: I have reviewed the patient's current medications.  Results for orders placed during the hospital encounter of 08/01/13 (from the past 48 hour(s))  CBC WITH DIFFERENTIAL     Status: Abnormal   Collection Time    08/01/13 12:22 PM      Result Value Range   WBC 13.4 (*) 4.0 - 10.5 K/uL   RBC 4.67  4.22 - 5.81 MIL/uL   Hemoglobin 13.1  13.0 - 17.0 g/dL   HCT 40.9 (*) 81.1 - 91.4 %   MCV  78.2  78.0 - 100.0 fL   MCH 28.1  26.0 - 34.0 pg   MCHC 35.9  30.0 - 36.0 g/dL   RDW 78.2  95.6 - 21.3 %   Platelets 237  150 - 400 K/uL   Neutrophils Relative % 75  43 - 77 %   Neutro Abs 10.0 (*) 1.7 - 7.7 K/uL   Lymphocytes Relative 14  12 - 46 %   Lymphs Abs 1.9  0.7 - 4.0 K/uL   Monocytes Relative 11  3 - 12 %   Monocytes Absolute 1.5 (*) 0.1 - 1.0 K/uL   Eosinophils Relative 0  0 - 5 %   Eosinophils Absolute 0.0  0.0 - 0.7 K/uL   Basophils Relative 0  0 - 1 %   Basophils Absolute 0.0  0.0 - 0.1 K/uL  COMPREHENSIVE METABOLIC PANEL     Status: Abnormal   Collection Time    08/01/13 12:22 PM      Result Value Range   Sodium 128 (*) 135 - 145 mEq/L   Potassium 3.4 (*) 3.5 - 5.1 mEq/L   Chloride 90 (*) 96 - 112 mEq/L   CO2 22  19 - 32 mEq/L   Glucose,  Bld 245 (*) 70 - 99 mg/dL   BUN 11  6 - 23 mg/dL   Creatinine, Ser 1.61  0.50 - 1.35 mg/dL   Calcium 8.8  8.4 - 09.6 mg/dL   Total Protein 8.4 (*) 6.0 - 8.3 g/dL   Albumin 2.9 (*) 3.5 - 5.2 g/dL   AST 13  0 - 37 U/L   ALT 7  0 - 53 U/L   Alkaline Phosphatase 88  39 - 117 U/L   Total Bilirubin 0.7  0.3 - 1.2 mg/dL   GFR calc non Af Amer >90  >90 mL/min   GFR calc Af Amer >90  >90 mL/min   Comment: (NOTE)     The eGFR has been calculated using the CKD EPI equation.     This calculation has not been validated in all clinical situations.     eGFR's persistently <90 mL/min signify possible Chronic Kidney     Disease.  TROPONIN I     Status: None   Collection Time    08/01/13 12:22 PM      Result Value Range   Troponin I <0.30  <0.30 ng/mL   Comment:            Due to the release kinetics of cTnI,     a negative result within the first hours     of the onset of symptoms does not rule out     myocardial infarction with certainty.     If myocardial infarction is still suspected,     repeat the test at appropriate intervals.  PRO B NATRIURETIC PEPTIDE     Status: None   Collection Time    08/01/13 12:22 PM      Result  Value Range   Pro B Natriuretic peptide (BNP) 100.2  0 - 125 pg/mL  CG4 I-STAT (LACTIC ACID)     Status: Abnormal   Collection Time    08/01/13  1:39 PM      Result Value Range   Lactic Acid, Venous 2.21 (*) 0.5 - 2.2 mmol/L    Dg Chest 2 View  08/01/2013   CLINICAL DATA:  Left foot wound infection and shortness of breath.  EXAM: CHEST - 2 VIEW  COMPARISON:  09/05/2012  FINDINGS: The heart size and mediastinal contours are within normal limits. There is no evidence of pulmonary edema, consolidation, pneumothorax, nodule or pleural fluid. The visualized skeletal structures are unremarkable.  IMPRESSION: No active disease.   Electronically Signed   By: Irish Lack M.D.   On: 08/01/2013 14:57   Dg Foot Complete Left  08/01/2013   CLINICAL DATA:  History of diabetes and foot pain and wound  EXAM: LEFT FOOT - COMPLETE 3+ VIEW  COMPARISON:  None.  FINDINGS: There are changes consistent with transmetatarsal amputation of the 3rd toe. Significant degenerative changes are noted in the tarsal bones as well as in the interphalangeal joint of the 1st toe. A bowel deep head of the 5th metatarsal and the proximal phalanx there are some bony destructive changes as well as significant soft tissue air consistent with localized infection and osteomyelitis. No other focal abnormality is seen. Dystrophic calcifications are noted in the distal calf.  IMPRESSION: Soft tissue and bony changes about the 5th metatarsal head consistent with osteomyelitis.   Electronically Signed   By: Alcide Clever M.D.   On: 08/01/2013 14:55    Review of Systems  Constitutional: Positive for fever, chills and malaise/fatigue.  HENT: Negative.   Eyes:  Negative.   Respiratory: Positive for shortness of breath.   Gastrointestinal: Negative.   Genitourinary: Negative.   Musculoskeletal: Positive for joint pain.  Skin: Positive for rash.  Neurological: Positive for weakness.  Psychiatric/Behavioral: Negative.    Blood pressure  114/71, pulse 97, temperature 100.1 F (37.8 C), temperature source Oral, resp. rate 20, SpO2 99.00%. Physical Exam  Constitutional: He appears well-developed.  HENT:  Head: Normocephalic.  Eyes: Pupils are equal, round, and reactive to light.  Neck: Normal range of motion.  left foot with 3rd toe amputated - erythema and gangrene ous 5th mt ulcer present - foot with swelling but no crepitus - compts soft  Assessment/Plan: 52 yo male with poorly controlled iddm (he doesn't check), left 5th toe osteo scheduled for OR next week with Lajoyce Corners, albumin low 2.9 - Na low at 128 -  wbc 13.4 - borderline tachycardic and hypotensive but has responded well to volume in ER  Agree with step down - broad spectrum IV abx - will need surgery in near future and would like to wait for Lajoyce Corners who at the earliest could operate on mon pm - if necessary i could amputate this weekend - initial plan was for trans met amputation, this may work out but bka also a possibility down the road - hold off on dvt prophylaxis except asa tonight - will reeval am   Jorma Tassinari SCOTT 08/01/2013, 5:08 PM

## 2013-08-01 NOTE — ED Notes (Signed)
Pt returned from xray

## 2013-08-01 NOTE — ED Notes (Addendum)
Pt c/o of wound infection in his left foot, fever, chills, shortness, shaking, sweats, diarrhea and has not eaten the last two days.  Pt previously had 5th digit on left foot amputated.  A wound developed on his 4th digit shortly after surgery.  Pt is scheduled for his 4th digit amputation on this coming Tues.  He called Dr. Lajoyce Corners at [facility] orthopedics yesterday telling them of the symptoms and they told him to come to the ER.  Strong odor and drainage noted to left foot wound, also redness and swelling to extremity.  Pt in NAD, A&O.

## 2013-08-01 NOTE — ED Notes (Signed)
Pt is aware of UA order, continues to be unable to provide urine.

## 2013-08-01 NOTE — Consult Note (Addendum)
ANTIBIOTIC CONSULT NOTE - INITIAL  Pharmacy Consult for vancomycin/zosyn Indication: foot wound  Allergies  Allergen Reactions  . Bactroban [Mupirocin Calcium] Hives  . Shellfish Allergy Rash    Patient Measurements:  weight: 154.2 kg (as of 04/25/13)   Vital Signs: Temp: 97.9 F (36.6 C) (10/04 1111) Temp src: Oral (10/04 1111) BP: 137/85 mmHg (10/04 1111) Pulse Rate: 101 (10/04 1111)   Labs: No results found for this basename: WBC, HGB, PLT, LABCREA, CREATININE,  in the last 72 hours The CrCl is unknown because both a height and weight (above a minimum accepted value) are required for this calculation. No results found for this basename: VANCOTROUGH, VANCOPEAK, VANCORANDOM, GENTTROUGH, GENTPEAK, GENTRANDOM, TOBRATROUGH, TOBRAPEAK, TOBRARND, AMIKACINPEAK, AMIKACINTROU, AMIKACIN,  in the last 72 hours   Microbiology: No results found for this or any previous visit (from the past 720 hour(s)).  Medical History: Past Medical History  Diagnosis Date  . Bronchitis     hx of;last time about 70yrs ago  . Neuromuscular disorder     diabetic neuropathy in feet  . Arthritis   . Hyperlipidemia     takes Atorvastatin daily  . Diabetes mellitus without complication     takes Metformin bid and Lantus nightly    Medications:  Assessment:  52 yo M presenting 10/4 with a left foot wound infection, complaining of strong odor and increased drainage, fever, chills, diarrhea, poor oral intake x2 days. Pharmacy consulted for empiric coverage with vancomycin and zosyn.  Per 04/25/13: weight 154.2 kg SCr 0.76  Height 206 cm 08/01/13 labs: SCr 0.9 estimated CrCl >100 ml/min  Wound, blood, and urine cultures sent.  Goal of Therapy:  Vancomycin trough level 15-20 mcg/ml Eradication of infection  Plan:  -Gave vancomycin 2g IV once. Per obesity nomogram and given renal function, will start vancomycin 1500 mg IV q 12 hours beginning at 0100 -Give zosyn 3.375g IV q 8h (4 hour infusion).   -F/u renal function, lab data, cultures, clinical progression -Consider checking vancomycin trough once at steady state   Farouk Vivero C. Sadie Pickar, PharmD Clinical Pharmacist-Resident Pager: [phone] Pharmacy: [phone] 08/01/2013 12:23 PM

## 2013-08-01 NOTE — ED Notes (Signed)
Admitting MD at bedside.

## 2013-08-01 NOTE — H&P (Addendum)
[facility] History and Physical  Gabriel Howe ZOX:[id number] DOB: [birth year] DOA: 08/01/2013  Referring physician: Dr. Micheline Maze PCP: Vickey Sages, MD  Specialists: orthopedics  Chief Complaint: left leg pain  HPI: Gabriel Howe is a 52 y.o. male  Past medical history of poorly controlled diabetes with her last hemoglobin A1c of breath back in February 2014 status post M.D. and amputation on the left foot twice this year by Dr. Lajoyce Corners currently on Doxy been followed by Dr. as an outpatient and was scheduled to get an amputation on Tuesday on that left foot comes in for not being able to bear weight on that foot for 2 days some dyspnea on excisions, fevers some nausea no vomiting denies any chest pain. He relates he's also been having chills.  In the ED: Initially his blood pressure was borderline low he got 2 L of normal saline and his blood pressure stabilized and he continues to be tachycardic, a basic metabolic panel was done that shows hyponatremia hypochloremia and hypokalemia a lactic acid of 2.0 a CBC with differential shows a white count of 13 with a left shift and a left foot x-ray shows osteomyelitis with cellulitis. EKG shows sinus tachycardia no ST segment changes.  Review of Systems: The patient denies anorexia,  weight loss,, vision loss, decreased hearing, hoarseness, chest pain, syncope, dyspnea on exertion, peripheral edema, balance deficits, hemoptysis, abdominal pain, melena, hematochezia, severe indigestion/heartburn, hematuria, incontinence, genital sores, muscle weakness, suspicious skin lesions, transient blindness, difficulty walking, depression, unusual weight change, abnormal bleeding, enlarged lymph nodes, angioedema, and breast masses.    Past Medical History  Diagnosis Date  . Bronchitis     hx of;last time about 43yrs ago  . Neuromuscular disorder     diabetic neuropathy in feet  . Arthritis   . Hyperlipidemia     takes Atorvastatin daily  . Diabetes  mellitus without complication     takes Metformin bid and Lantus nightly   Past Surgical History  Procedure Laterality Date  . Toe amputation      at [facility], and removal of partial of second to 2012  . Tonsillectomy    . Amputation Right 01/02/2013    Procedure: AMPUTATION BELOW KNEE;  Surgeon: Nadara Mustard, MD;  Location: [facility] OR;  Service: Orthopedics;  Laterality: Right;  Right Below Knee Amputation  . Esophagogastroduodenoscopy    . Amputation Left 04/25/2013    Procedure: AMPUTATION RAY;  Surgeon: Nadara Mustard, MD;  Location: [facility] OR;  Service: Orthopedics;  Laterality: Left;  Left Foot 3rd Ray Amputation   Social History:  reports that he has quit smoking. He does not have any smokeless tobacco history on file. He reports that he does not drink alcohol or use illicit drugs. Lives at home by himself  Allergies  Allergen Reactions  . Bactroban [Mupirocin Calcium] Hives  . Shellfish Allergy Rash    Family History  Problem Relation Age of Onset  . Other Mother   . Other Father    Prior to Admission medications   Medication Sig Start Date End Date Taking? Authorizing Provider  atorvastatin (LIPITOR) 10 MG tablet Take 10 mg by mouth at bedtime.    Yes Historical Provider, MD  doxycycline (VIBRAMYCIN) 100 MG capsule Take 100 mg by mouth 2 (two) times daily. continuous course for the past 3 months   Yes Historical Provider, MD  insulin glargine (LANTUS) 100 UNIT/ML injection Inject 20 Units into the skin at bedtime. 12/21/12  Yes Alba Cory, MD  metFORMIN (GLUCOPHAGE) 500 MG tablet Take 500 mg by mouth 2 (two) times daily.   Yes Historical Provider, MD  silver sulfADIAZINE (SILVADENE) 1 % cream Apply 1 application topically daily.   Yes Historical Provider, MD   Physical Exam: Filed Vitals:   08/01/13 1609  BP: 114/71  Pulse: 97  Temp:   Resp:     BP 114/71  Pulse 97  Temp(Src) 100.1 F (37.8 C) (Oral)  Resp 20  SpO2 99%  General Appearance:    Alert, cooperative, no  distress, appears stated age              Throat:   Lips, mucosa, and tongue dry.  Neck:   Supple, symmetrical, trachea midline, no adenopathy;       thyroid:  No JVD     Lungs:     Clear to auscultation bilaterally, respirations unlabored     Heart:    Regular rate and rhythm, S1 and S2 normal, no murmur, rub   or gallop  Abdomen:     Soft, non-tender, bowel sounds active all four quadrants,    no masses, no organomegaly        Extremities:   Status post BKA on the right his left foot is swollen and erythematous with no sensation and an abscess on the fifth toe with some fluctuation      Skin:   hypertrophic skin on the left throat erythematous warm and tender to touch.  Lymph nodes:   Cervical, supraclavicular, and axillary nodes normal  Neurologic:   CNII-XII intact. Normal strength, sensation and reflexes      throughout     Labs on Admission:  Basic Metabolic Panel:  Recent Labs Lab 08/01/13 1222  NA 128*  K 3.4*  CL 90*  CO2 22  GLUCOSE 245*  BUN 11  CREATININE 0.90  CALCIUM 8.8   Liver Function Tests:  Recent Labs Lab 08/01/13 1222  AST 13  ALT 7  ALKPHOS 88  BILITOT 0.7  PROT 8.4*  ALBUMIN 2.9*   No results found for this basename: LIPASE, AMYLASE,  in the last 168 hours No results found for this basename: AMMONIA,  in the last 168 hours CBC:  Recent Labs Lab 08/01/13 1222  WBC 13.4*  NEUTROABS 10.0*  HGB 13.1  HCT 36.5*  MCV 78.2  PLT 237   Cardiac Enzymes:  Recent Labs Lab 08/01/13 1222  TROPONINI <0.30    BNP (last 3 results)  Recent Labs  08/01/13 1222  PROBNP 100.2   CBG: No results found for this basename: GLUCAP,  in the last 168 hours  Radiological Exams on Admission: Dg Chest 2 View  08/01/2013   CLINICAL DATA:  Left foot wound infection and shortness of breath.  EXAM: CHEST - 2 VIEW  COMPARISON:  09/05/2012  FINDINGS: The heart size and mediastinal contours are within normal limits. There is no evidence of  pulmonary edema, consolidation, pneumothorax, nodule or pleural fluid. The visualized skeletal structures are unremarkable.  IMPRESSION: No active disease.   Electronically Signed   By: Irish Lack M.D.   On: 08/01/2013 14:57   Dg Foot Complete Left  08/01/2013   CLINICAL DATA:  History of diabetes and foot pain and wound  EXAM: LEFT FOOT - COMPLETE 3+ VIEW  COMPARISON:  None.  FINDINGS: There are changes consistent with transmetatarsal amputation of the 3rd toe. Significant degenerative changes are noted in the tarsal bones as well as in the interphalangeal joint of the 1st  toe. A bowel deep head of the 5th metatarsal and the proximal phalanx there are some bony destructive changes as well as significant soft tissue air consistent with localized infection and osteomyelitis. No other focal abnormality is seen. Dystrophic calcifications are noted in the distal calf.  IMPRESSION: Soft tissue and bony changes about the 5th metatarsal head consistent with osteomyelitis.   Electronically Signed   By: Alcide Clever M.D.   On: 08/01/2013 14:55    EKG: Independently reviewed. Sinus tachycardia normal axis no ST segment changes  Assessment/Plan Sepsis associated hypotension/Cellulitis and abscess of toe of left foot/Leukocytosis/left foot osteomyelitis: - I will give him additional liter of normal saline, and start him on vancomycin and  Zosyn. He has wet gangrene on that left foot with possible abscess.  I consulted the orthopedic surgeon for debridement, he currently has a lactic as of 2.2 with associated hypotension and tachycardia. Which has responded initially to fluid resuscitation. Admit him to the step down unit monitor strict I.'s and O.'s, check blood cultures. Would recommend emergent I&D as I don't think the IV antibiotics to help control the source of infection by itself.  - Place a Foley.  - Send wound cultures  DM2 (diabetes mellitus, type 2):  - Hold metformin, start a low dose Lantus plus  sliding scale insulin.  Hyponatremia - This most likely prerenal, we'll go ahead and check a urinary sodium started on IV fluids aggressively. Monitor strict I.'s and O.'s place a Foley.  Hypokalemia - Replete, basic metabolic panel in the morning.     Code Status: full Family Communication: none Disposition Plan: inpatient  Time spent: 80 minutes  Marinda Elk [facility] Pager [phone]  If 7PM-7AM, please contact night-coverage [email or web address] Password [email or web address] 08/01/2013, 4:44 PM

## 2013-08-01 NOTE — ED Provider Notes (Signed)
Medical screening examination/treatment/procedure(s) were performed by non-physician practitioner and as supervising physician I was immediately available for consultation/collaboration.  Shanna Cisco, MD 08/01/13 1725

## 2013-08-01 NOTE — ED Notes (Signed)
MD at bedside. 

## 2013-08-01 NOTE — ED Provider Notes (Signed)
CSN: [id number]     Arrival date & time 08/01/13  1058 History   First MD Initiated Contact with Patient 08/01/13 1124     Chief Complaint  Patient presents with  . Wound Infection  . Shortness of Breath   (Consider location/radiation/quality/duration/timing/severity/associated sxs/prior Treatment) HPI  Gabriel Howe is a 52 y.o. 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 Lajoyce Corners who plans amputation of  All of the toes to the left foot with significantly worsening pain and inability to bear weight over the last 2 days. This is associated with SOB and DOE worsening over the last day. Patient denies fever, pain when he is not weightbearing, nausea vomiting, chest pain, shortness of breath, abdominal pain, change in bowel or bladder habits. He reports an increase in discharge from the area. Patient does not have a primary care physician and his diabetes is managed by an endocrinologist.   Past Medical History  Diagnosis Date  . Bronchitis     hx of;last time about 49yrs ago  . Neuromuscular disorder     diabetic neuropathy in feet  . Arthritis   . Hyperlipidemia     takes Atorvastatin daily  . Diabetes mellitus without complication     takes Metformin bid and Lantus nightly   Past Surgical History  Procedure Laterality Date  . Toe amputation      at [facility], and removal of partial of second to 2012  . Tonsillectomy    . Amputation Right 01/02/2013    Procedure: AMPUTATION BELOW KNEE;  Surgeon: Nadara Mustard, MD;  Location: [facility] OR;  Service: Orthopedics;  Laterality: Right;  Right Below Knee Amputation  . Esophagogastroduodenoscopy    . Amputation Left 04/25/2013    Procedure: AMPUTATION RAY;  Surgeon: Nadara Mustard, MD;  Location: [facility] OR;  Service: Orthopedics;  Laterality: Left;  Left Foot 3rd Ray Amputation   History reviewed. No pertinent family history. History  Substance Use Topics  . Smoking status: Former Games developer  . Smokeless  tobacco: Not on file     Comment: quit 62yrs ago  . Alcohol Use: No    Review of Systems 10 systems reviewed and found to be negative, except as noted in the HPI   Allergies  Bactroban and Shellfish allergy  Home Medications   Current Outpatient Rx  Name  Route  Sig  Dispense  Refill  . atorvastatin (LIPITOR) 10 MG tablet   Oral   Take 10 mg by mouth at bedtime.          Marland Kitchen doxycycline (VIBRAMYCIN) 100 MG capsule   Oral   Take 100 mg by mouth 2 (two) times daily. continuous course for the past 3 months         . insulin glargine (LANTUS) 100 UNIT/ML injection   Subcutaneous   Inject 20 Units into the skin at bedtime.   10 mL   12     Please provide pen.   . insulin glargine (LANTUS) 100 UNIT/ML injection   Subcutaneous   Inject 20 Units into the skin at bedtime.         . metFORMIN (GLUCOPHAGE) 500 MG tablet   Oral   Take 500 mg by mouth 2 (two) times daily.         . silver sulfADIAZINE (SILVADENE) 1 % cream   Topical   Apply 1 application topically daily.          BP 137/85  Pulse 101  Temp(Src) 97.9 F (36.6 C) (Oral)  Resp 21  SpO2 100% Physical Exam  Nursing note and vitals reviewed. Constitutional: He is oriented to person, place, and time. He appears well-developed and well-nourished. No distress.  Obese   HENT:  Head: Normocephalic.  Mouth/Throat: Oropharynx is clear and moist.  Eyes: Conjunctivae and EOM are normal. Pupils are equal, round, and reactive to light.  Neck: Normal range of motion.  Cardiovascular: Normal rate, regular rhythm and intact distal pulses.   Pulmonary/Chest: Effort normal and breath sounds normal. No stridor. No respiratory distress. He has no wheezes. He has no rales. He exhibits no tenderness.  tachypnea   Abdominal: Soft. Bowel sounds are normal. He exhibits no distension and no mass. There is no tenderness. There is no rebound and no guarding.  Musculoskeletal: Normal range of motion.  Right BKA,   Left  LLE with middle digit amputation, foul-smelling open wound to 4th digit, mild TTP and swelling of all digits, no streaking up the leg  Neurological: He is alert and oriented to person, place, and time.  Psychiatric: He has a normal mood and affect.    ED Course  Procedures (including critical care time) Labs Review Labs Reviewed  CBC WITH DIFFERENTIAL - Abnormal; Notable for the following:    WBC 13.4 (*)    HCT 36.5 (*)    Neutro Abs 10.0 (*)    Monocytes Absolute 1.5 (*)    All other components within normal limits  COMPREHENSIVE METABOLIC PANEL - Abnormal; Notable for the following:    Sodium 128 (*)    Potassium 3.4 (*)    Chloride 90 (*)    Glucose, Bld 245 (*)    Total Protein 8.4 (*)    Albumin 2.9 (*)    All other components within normal limits  CG4 I-STAT (LACTIC ACID) - Abnormal; Notable for the following:    Lactic Acid, Venous 2.21 (*)    All other components within normal limits  URINE CULTURE  CULTURE, BLOOD (ROUTINE X 2)  CULTURE, BLOOD (ROUTINE X 2)  WOUND CULTURE  TROPONIN I  PRO B NATRIURETIC PEPTIDE  URINALYSIS, ROUTINE W REFLEX MICROSCOPIC  POCT LACTIC ACID (LACTATE)   Imaging Review Dg Chest 2 View  08/01/2013   CLINICAL DATA:  Left foot wound infection and shortness of breath.  EXAM: CHEST - 2 VIEW  COMPARISON:  09/05/2012  FINDINGS: The heart size and mediastinal contours are within normal limits. There is no evidence of pulmonary edema, consolidation, pneumothorax, nodule or pleural fluid. The visualized skeletal structures are unremarkable.  IMPRESSION: No active disease.   Electronically Signed   By: Irish Lack M.D.   On: 08/01/2013 14:57   Dg Foot Complete Left  08/01/2013   CLINICAL DATA:  History of diabetes and foot pain and wound  EXAM: LEFT FOOT - COMPLETE 3+ VIEW  COMPARISON:  None.  FINDINGS: There are changes consistent with transmetatarsal amputation of the 3rd toe. Significant degenerative changes are noted in the tarsal bones as well  as in the interphalangeal joint of the 1st toe. A bowel deep head of the 5th metatarsal and the proximal phalanx there are some bony destructive changes as well as significant soft tissue air consistent with localized infection and osteomyelitis. No other focal abnormality is seen. Dystrophic calcifications are noted in the distal calf.  IMPRESSION: Soft tissue and bony changes about the 5th metatarsal head consistent with osteomyelitis.   Electronically Signed   By: Alcide Clever  M.D.   On: 08/01/2013 14:55     Date: 08/01/2013  Rate: 101  Rhythm: sinus tachycardia  QRS Axis: normal  Intervals: normal  ST/T Wave abnormalities: normal  Conduction Disutrbances:none  Narrative Interpretation:   Old EKG Reviewed: none available   MDM   1. Cellulitis and abscess of lower leg   2. Diabetic neuropathy   3. Sepsis   4. Osteomyelitis of left lower limb    Filed Vitals:   08/01/13 1515 08/01/13 1530 08/01/13 1554 08/01/13 1609  BP: 107/64 115/65 114/78 114/71  Pulse: 97 91  97  Temp:   100.1 F (37.8 C)   TempSrc:   Oral   Resp: 15 18 20    SpO2: 99% 98% 98% 99%     Gabriel Howe is a 52 y.o. male with worsening left lower extremity osteomyelitis and openly draining wound, no real pain. Patient's followed by Dr. Lajoyce Corners who has advised him to present to the ED for admission to expedite surgery. Blood and wound cultures obtained and patient is started on vancomycin and Zosyn dosed via pharmacy consult. He has been nonmobile for the last 2 days. Patient is tachypneic and reports a shortness of breath. Lung sounds are clear to auscultation bilaterally. Chest x-ray shows no infiltrate, bilateral lower extremity venous Dopplers are negative. EKG is nonischemic, troponin is negative and BMP is also within normal limits. Triage initiated lactate is mildly elevated at 2.2.   Plain film shows fifth metatarsal head osteomyelitis.  Orthopedic consult from Dr. August Saucer appreciated: He asked for a medical  admission and they will consult. He is also asked that there be no anticoagulation initiated. This was explained to admitting physician Dr. Robb Matar.    Medications  vancomycin (VANCOCIN) 1,500 mg in sodium chloride 0.9 % 500 mL IVPB (not administered)  piperacillin-tazobactam (ZOSYN) IVPB 3.375 g (not administered)  ondansetron (ZOFRAN) injection 4 mg (not administered)  vancomycin (VANCOCIN) 2,000 mg in sodium chloride 0.9 % 500 mL IVPB (2,000 mg Intravenous New Bag/Given 08/01/13 1323)  sodium chloride 0.9 % bolus 1,000 mL (0 mLs Intravenous Stopped 08/01/13 1519)  0.9 %  sodium chloride infusion ( Intravenous Stopped 08/01/13 1619)  sodium chloride 0.9 % bolus 1,000 mL (1,000 mLs Intravenous New Bag/Given 08/01/13 1621)   Note: Portions of this report may have been transcribed using voice recognition software. Every effort was made to ensure accuracy; however, inadvertent computerized transcription errors may be present      Wynetta Emery, PA-C 08/01/13 1622

## 2013-08-01 NOTE — ED Notes (Signed)
Pt knows that urine is needed 

## 2013-08-02 ENCOUNTER — Inpatient Hospital Stay (HOSPITAL_COMMUNITY): Payer: BC Managed Care – PPO

## 2013-08-02 DIAGNOSIS — L02419 Cutaneous abscess of limb, unspecified: Secondary | ICD-10-CM

## 2013-08-02 LAB — BASIC METABOLIC PANEL
CO2: 22 mEq/L (ref 19–32)
Calcium: 8.1 mg/dL — ABNORMAL LOW (ref 8.4–10.5)
Creatinine, Ser: 0.77 mg/dL (ref 0.50–1.35)
GFR calc Af Amer: >90 mL/min (ref >90)
GFR calc non Af Amer: >90 mL/min (ref >90)
Potassium: 3.3 mEq/L — ABNORMAL LOW (ref 3.5–5.1)

## 2013-08-02 LAB — GLUCOSE, CAPILLARY
Glucose-Capillary: 196 mg/dL — ABNORMAL HIGH (ref 70–99)
Glucose-Capillary: 196 mg/dL — ABNORMAL HIGH (ref 70–99)
Glucose-Capillary: 178 mg/dL — ABNORMAL HIGH (ref 70–99)

## 2013-08-02 LAB — CBC
MCH: 27.5 pg (ref 26.0–34.0)
MCHC: 34.9 g/dL (ref 30.0–36.0)
MCV: 78.6 fL (ref 78.0–100.0)
Platelets: 236 10*3/uL (ref 150–400)
RBC: 3.97 MIL/uL — ABNORMAL LOW (ref 4.22–5.81)
RDW: 14 % (ref 11.5–15.5)

## 2013-08-02 LAB — HEMOGLOBIN A1C: Hgb A1c MFr Bld: 9 % — ABNORMAL HIGH (ref <5.7)

## 2013-08-02 LAB — URINE CULTURE

## 2013-08-02 MED ORDER — INSULIN ASPART 100 UNIT/ML ~~LOC~~ SOLN
0.0000 [IU] | Freq: Three times a day (TID) | SUBCUTANEOUS | Status: DC
  Administered 2013-08-02 – 2013-08-03 (×2): 4 [IU] via SUBCUTANEOUS
  Administered 2013-08-04: 3 [IU] via SUBCUTANEOUS
  Administered 2013-08-04: 4 [IU] via SUBCUTANEOUS
  Administered 2013-08-04 – 2013-08-05 (×2): 3 [IU] via SUBCUTANEOUS

## 2013-08-02 MED ORDER — OXYCODONE HCL 5 MG PO TABS: 5.0000 mg | ORAL_TABLET | ORAL | Status: DC | PRN

## 2013-08-02 MED ORDER — INSULIN ASPART 100 UNIT/ML ~~LOC~~ SOLN: 0.0000 [IU] | Freq: Every day | SUBCUTANEOUS | Status: DC

## 2013-08-02 MED ORDER — INSULIN ASPART 100 UNIT/ML ~~LOC~~ SOLN
3.0000 [IU] | Freq: Three times a day (TID) | SUBCUTANEOUS | Status: DC
  Administered 2013-08-02 – 2013-08-05 (×6): 3 [IU] via SUBCUTANEOUS

## 2013-08-02 MED ORDER — ALBUTEROL SULFATE (5 MG/ML) 0.5% IN NEBU: 2.5000 mg | INHALATION_SOLUTION | Freq: Four times a day (QID) | RESPIRATORY_TRACT | Status: DC | PRN

## 2013-08-02 MED ORDER — POTASSIUM CHLORIDE CRYS ER 20 MEQ PO TBCR
40.0000 meq | EXTENDED_RELEASE_TABLET | Freq: Once | ORAL | Status: AC
  Administered 2013-08-02: 40 meq via ORAL
  Filled 2013-08-02: qty 2

## 2013-08-02 MED ORDER — IOHEXOL 300 MG/ML  SOLN
80.0000 mL | Freq: Once | INTRAMUSCULAR | Status: AC | PRN
  Administered 2013-08-02: 80 mL via INTRAVENOUS

## 2013-08-02 MED ORDER — INSULIN GLARGINE 100 UNIT/ML ~~LOC~~ SOLN
26.0000 [IU] | Freq: Every day | SUBCUTANEOUS | Status: DC
  Administered 2013-08-02 – 2013-08-04 (×3): 26 [IU] via SUBCUTANEOUS
  Filled 2013-08-02 (×4): qty 0.26

## 2013-08-02 NOTE — Progress Notes (Signed)
[facility] Progress Note [place] TEAM 1 - Stepdown/ICU Terral Cooks ZOX:[id number] DOB: 1961/09/13 DOA: 08/01/2013 PCP: Vickey Sages, MD  Admit HPI / Brief Narrative: 52 y.o. male w/ a past medical history of poorly controlled diabetes with last hemoglobin A1c 12.5 in February 2014.  He is status post I&D of chronic left foot wounds twice this year by Dr. Lajoyce Corners, and has been on doxy since March.  Patient had previously been scheduled for an amputation of the left foot when he came in w/ c/o not being able to bear weight on that foot for 2 days with associated dyspnea on exertion, fevers with chills, and nausea with no vomiting.  In the ED:  Initially his blood pressure was borderline low - he got 2 L of normal saline and his blood pressure stabilized. He continued to be tachycardic - a basic metabolic panel revealed hyponatremia hypochloremia and hypokalemia w/ a lactic acid of 2.0 - a white count of 13 was noted and a left foot x-ray revealed osteomyelitis. EKG noted sinus tachycardia with no ST segment changes.   Assessment/Plan:  Sepsis due to diabetic foot (HR 101, RR 21, WBC 13.4) Sepsis physiology has resolved - monitor in step down unit overnight but can likely go to orthopedic floor postoperatively  Left diabetic foot with osteomyelitis Prior history of surgical I&Ds as well as digit amputations - to OR Monday  Uncontrolled diabetes mellitus CBG poorly controlled - adjust treatment plan and follow  Hyponatremia Likely simply related to volume status - recheck in a.m.  Hypokalemia Supplement and follow - check magnesium  Status post right below knee amputation  Morbid obesity  HLD Cont home medical tx  Code Status: FULL Family Communication: no family present at time of exam Disposition Plan: SDU   Consultants: Orthopedic surgery  Procedures: 10/5 venous duplex negative for DVT bilateral lower extremities  Antibiotics: Vancomycin 10/4>> Zosyn  10/4 >>  DVT prophylaxis: SQ heparin  HPI/Subjective: The patient is alert and conversant and quite pleasant.  He states he feels much better.  He has no new complaints.  He denies chest pain shortness of breath fevers chills nausea or vomiting.  Pain in the lower extremity is well-controlled.  Objective: Blood pressure 111/65, pulse 87, temperature 97.6 F (36.4 C), temperature source Oral, resp. rate 21, height 6\' 3"  (1.905 m), weight 169.5 kg (373 lb 10.9 oz), SpO2 97.00%.  Intake/Output Summary (Last 24 hours) at 08/02/13 1420 Last data filed at 08/02/13 0818  Gross per 24 hour  Intake 1902.5 ml  Output    200 ml  Net 1702.5 ml   Exam: General: No acute respiratory distress Lungs: Clear to auscultation bilaterally without wheezes or crackles Cardiovascular: Regular rate and rhythm without murmur gallop or rub normal S1 and S2 Abdomen: Nontender, nondistended, soft, bowel sounds positive, no rebound, no ascites, no appreciable mass Extremities: Left lower extremity is extensively dressed with pungent odor consistent with necrotic tissue  Data Reviewed: Basic Metabolic Panel:  Recent Labs Lab 08/01/13 1222 08/02/13 0528  NA 128* 133*  K 3.4* 3.3*  CL 90* 98  CO2 22 22  GLUCOSE 245* 180*  BUN 11 10  CREATININE 0.90 0.77  CALCIUM 8.8 8.1*   Liver Function Tests:  Recent Labs Lab 08/01/13 1222  AST 13  ALT 7  ALKPHOS 88  BILITOT 0.7  PROT 8.4*  ALBUMIN 2.9*   CBC:  Recent Labs Lab 08/01/13 1222 08/02/13 0528  WBC 13.4* 11.9*  NEUTROABS 10.0*  --  HGB 13.1 10.9*  HCT 36.5* 31.2*  MCV 78.2 78.6  PLT 237 236   Cardiac Enzymes:  Recent Labs Lab 08/01/13 1222  TROPONINI <0.30   BNP (last 3 results)  Recent Labs  08/01/13 1222  PROBNP 100.2   CBG:  Recent Labs Lab 08/01/13 2029 08/02/13 0002 08/02/13 0442 08/02/13 0748 08/02/13 1218  GLUCAP 184* 196* 187* 178* 197*    Recent Results (from the past 240 hour(s))  CULTURE, BLOOD  (ROUTINE X 2)     Status: None   Collection Time    08/01/13 12:22 PM      Result Value Range Status   Specimen Description BLOOD RIGHT ARM   Final   Special Requests BOTTLES DRAWN AEROBIC AND ANAEROBIC 8CC   Final   Culture  Setup Time     Final   Value: 08/01/2013 19:06     Performed at [facility]   Culture     Final   Value:        BLOOD CULTURE RECEIVED NO GROWTH TO DATE CULTURE WILL BE HELD FOR 5 DAYS BEFORE ISSUING A FINAL NEGATIVE REPORT     Performed at [facility]   Report Status PENDING   Incomplete  WOUND CULTURE     Status: None   Collection Time    08/01/13  1:27 PM      Result Value Range Status   Specimen Description WOUND LEFT FOOT   Final   Special Requests NONE   Final   Gram Stain PENDING   Incomplete   Culture     Final   Value: Culture reincubated for better growth     Performed at [facility]   Report Status PENDING   Incomplete  CULTURE, BLOOD (ROUTINE X 2)     Status: None   Collection Time    08/01/13  2:00 PM      Result Value Range Status   Specimen Description BLOOD LEFT ARM   Final   Special Requests BOTTLES DRAWN AEROBIC AND ANAEROBIC B 10CC R 5CC   Final   Culture  Setup Time     Final   Value: 08/01/2013 19:07     Performed at [facility]   Culture     Final   Value:        BLOOD CULTURE RECEIVED NO GROWTH TO DATE CULTURE WILL BE HELD FOR 5 DAYS BEFORE ISSUING A FINAL NEGATIVE REPORT     Performed at [facility]   Report Status PENDING   Incomplete  MRSA PCR SCREENING     Status: None   Collection Time    08/02/13  5:38 AM      Result Value Range Status   MRSA by PCR NEGATIVE  NEGATIVE Final   Comment:            The GeneXpert MRSA Assay (FDA     approved for NASAL specimens     only), is one component of a     comprehensive MRSA colonization     surveillance program. It is not     intended to diagnose MRSA     infection nor to guide or     monitor treatment for     MRSA infections.      Studies:  Recent x-ray studies have been reviewed in detail by the Attending Physician  Scheduled Meds:  Scheduled Meds: . aspirin EC  81 mg Oral Daily  . atorvastatin  10 mg Oral  QHS  . heparin  5,000 Units Subcutaneous Q8H  . insulin aspart  0-9 Units Subcutaneous Q4H  . insulin glargine  20 Units Subcutaneous QHS  . piperacillin-tazobactam (ZOSYN)  IV  3.375 g Intravenous Q8H  . sodium chloride  3 mL Intravenous Q12H  . vancomycin  1,500 mg Intravenous Q12H    Time spent on care of this patient: 35 mins   [facility] T  [facility] Office  [phone] Pager - Text Page per Loretha Stapler as per below:  On-Call/Text Page:      [email or web address]      password [email or web address]  If 7PM-7AM, please contact night-coverage [email or web address] Password [email or web address] 08/02/2013, 2:20 PM   LOS: 1 day

## 2013-08-02 NOTE — Progress Notes (Signed)
This patient's recorded weight in EPIC is 373lbs, The weight limit for this scanner table is 350lbs. I have called unit 3300 X2 to speak with RN and can not get an answer on the unit @ 0819 hrs, I will continue to try unit or walk to unit to speak with RN as soon as I am available.  Pleas ecall with questions or concerns ext [phone]-Eivin Mascio Daleen Squibb RT,R,MR

## 2013-08-02 NOTE — Plan of Care (Signed)
Problem: Phase I Progression Outcomes Goal: Pain controlled with appropriate interventions Outcome: Progressing Patient denies pain Goal: Voiding-avoid urinary catheter unless indicated Outcome: Progressing Pt voiding Goal: Hemodynamically stable Outcome: Progressing VSS

## 2013-08-02 NOTE — Progress Notes (Signed)
Pt stable - feels better this am bp and hr wnl Foot left erythema improved Too heavy for mri table Will try for ct with contrast ok to eat breakfast only

## 2013-08-03 ENCOUNTER — Inpatient Hospital Stay (HOSPITAL_COMMUNITY): Payer: BC Managed Care – PPO | Admitting: Anesthesiology

## 2013-08-03 ENCOUNTER — Encounter (HOSPITAL_COMMUNITY)
Admission: EM | Disposition: A | Discharge status: Home / Self Care | Payer: Self-pay | Source: Home / Self Care | Attending: Internal Medicine

## 2013-08-03 ENCOUNTER — Other Ambulatory Visit (HOSPITAL_COMMUNITY): Payer: Self-pay | Admitting: Orthopedic Surgery

## 2013-08-03 ENCOUNTER — Encounter (HOSPITAL_COMMUNITY): Payer: Self-pay | Admitting: Anesthesiology

## 2013-08-03 ENCOUNTER — Ambulatory Visit: Admit: 2013-08-03 | Payer: BC Managed Care – PPO | Admitting: Orthopedic Surgery

## 2013-08-03 HISTORY — PX: AMPUTATION: SHX166

## 2013-08-03 LAB — GLUCOSE, CAPILLARY
Glucose-Capillary: 164 mg/dL — ABNORMAL HIGH (ref 70–99)
Glucose-Capillary: 170 mg/dL — ABNORMAL HIGH (ref 70–99)
Glucose-Capillary: 123 mg/dL — ABNORMAL HIGH (ref 70–99)
Glucose-Capillary: 113 mg/dL — ABNORMAL HIGH (ref 70–99)

## 2013-08-03 LAB — BASIC METABOLIC PANEL
CO2: 23 mEq/L (ref 19–32)
GFR calc Af Amer: >90 mL/min (ref >90)
GFR calc non Af Amer: >90 mL/min (ref >90)
Glucose, Bld: 171 mg/dL — ABNORMAL HIGH (ref 70–99)
Potassium: 3.5 mEq/L (ref 3.5–5.1)
Sodium: 134 mEq/L — ABNORMAL LOW (ref 135–145)

## 2013-08-03 LAB — VANCOMYCIN, TROUGH: Vancomycin Tr: 5 ug/mL — ABNORMAL LOW (ref 10.0–20.0)

## 2013-08-03 SURGERY — AMPUTATION, FOOT, PARTIAL
Anesthesia: General | Site: Foot | Laterality: Left | Wound class: Dirty or Infected

## 2013-08-03 MED ORDER — ONDANSETRON HCL 4 MG/2ML IJ SOLN
INTRAMUSCULAR | Status: DC | PRN
  Administered 2013-08-03: 4 mg via INTRAMUSCULAR

## 2013-08-03 MED ORDER — ONDANSETRON HCL 4 MG PO TABS: 4.0000 mg | ORAL_TABLET | Freq: Four times a day (QID) | ORAL | Status: DC | PRN

## 2013-08-03 MED ORDER — ONDANSETRON HCL 4 MG/2ML IJ SOLN: 4.0000 mg | Freq: Once | INTRAMUSCULAR | Status: DC | PRN

## 2013-08-03 MED ORDER — LIDOCAINE HCL (CARDIAC) 20 MG/ML IV SOLN
INTRAVENOUS | Status: DC | PRN
  Administered 2013-08-03: 100 mg via INTRAVENOUS

## 2013-08-03 MED ORDER — FENTANYL CITRATE 0.05 MG/ML IJ SOLN
INTRAMUSCULAR | Status: DC | PRN
  Administered 2013-08-03: 100 ug via INTRAVENOUS
  Administered 2013-08-03: 150 ug via INTRAVENOUS

## 2013-08-03 MED ORDER — MIDAZOLAM HCL 5 MG/5ML IJ SOLN
INTRAMUSCULAR | Status: DC | PRN
  Administered 2013-08-03: 2 mg via INTRAVENOUS

## 2013-08-03 MED ORDER — METOCLOPRAMIDE HCL 5 MG/ML IJ SOLN
5.0000 mg | Freq: Three times a day (TID) | INTRAMUSCULAR | Status: DC | PRN
  Filled 2013-08-03: qty 2

## 2013-08-03 MED ORDER — WARFARIN SODIUM 10 MG PO TABS
10.0000 mg | ORAL_TABLET | Freq: Once | ORAL | Status: AC
  Administered 2013-08-03: 10 mg via ORAL
  Filled 2013-08-03: qty 1

## 2013-08-03 MED ORDER — COUMADIN BOOK
Freq: Once | Status: AC
  Administered 2013-08-03: 21:00:00
  Filled 2013-08-03: qty 1

## 2013-08-03 MED ORDER — 0.9 % SODIUM CHLORIDE (POUR BTL) OPTIME
TOPICAL | Status: DC | PRN
  Administered 2013-08-03: 1000 mL

## 2013-08-03 MED ORDER — PROPOFOL 10 MG/ML IV BOLUS
INTRAVENOUS | Status: DC | PRN
  Administered 2013-08-03: 150 mg via INTRAVENOUS

## 2013-08-03 MED ORDER — METOCLOPRAMIDE HCL 5 MG PO TABS
5.0000 mg | ORAL_TABLET | Freq: Three times a day (TID) | ORAL | Status: DC | PRN
  Filled 2013-08-03: qty 2

## 2013-08-03 MED ORDER — VANCOMYCIN HCL 10 G IV SOLR
1250.0000 mg | Freq: Four times a day (QID) | INTRAVENOUS | Status: DC
  Administered 2013-08-03 – 2013-08-04 (×3): 1250 mg via INTRAVENOUS
  Filled 2013-08-03 (×5): qty 1250

## 2013-08-03 MED ORDER — HYDROMORPHONE HCL PF 1 MG/ML IJ SOLN: 0.2500 mg | INTRAMUSCULAR | Status: DC | PRN

## 2013-08-03 MED ORDER — ONDANSETRON HCL 4 MG/2ML IJ SOLN: 4.0000 mg | Freq: Four times a day (QID) | INTRAMUSCULAR | Status: DC | PRN

## 2013-08-03 MED ORDER — SODIUM CHLORIDE 0.9 % IV SOLN
INTRAVENOUS | Status: DC
  Administered 2013-08-03: 10 mL via INTRAVENOUS

## 2013-08-03 MED ORDER — LACTATED RINGERS IV SOLN
INTRAVENOUS | Status: DC
  Administered 2013-08-03: 17:00:00 via INTRAVENOUS

## 2013-08-03 MED ORDER — WARFARIN VIDEO: Freq: Once | Status: DC

## 2013-08-03 MED ORDER — WARFARIN - PHARMACIST DOSING INPATIENT: Freq: Every day | Status: DC

## 2013-08-03 SURGICAL SUPPLY — 42 items
BANDAGE GAUZE ELAST BULKY 4 IN (GAUZE/BANDAGES/DRESSINGS) ×2 IMPLANT
BLADE SAW SGTL HD 18.5X60.5X1. (BLADE) ×2 IMPLANT
BLADE SURG 10 STRL SS (BLADE) IMPLANT
BNDG COHESIVE 4X5 TAN STRL (GAUZE/BANDAGES/DRESSINGS) ×2 IMPLANT
CLOTH BEACON ORANGE TIMEOUT ST (SAFETY) ×2 IMPLANT
COVER SURGICAL LIGHT HANDLE (MISCELLANEOUS) ×2 IMPLANT
CUFF TOURNIQUET SINGLE 34IN LL (TOURNIQUET CUFF) IMPLANT
CUFF TOURNIQUET SINGLE 44IN (TOURNIQUET CUFF) IMPLANT
DRAPE INCISE IOBAN 66X45 STRL (DRAPES) ×2 IMPLANT
DRAPE U-SHAPE 47X51 STRL (DRAPES) ×2 IMPLANT
DRSG ADAPTIC 3X8 NADH LF (GAUZE/BANDAGES/DRESSINGS) ×2 IMPLANT
DRSG PAD ABDOMINAL 8X10 ST (GAUZE/BANDAGES/DRESSINGS) ×4 IMPLANT
DURAPREP 26ML APPLICATOR (WOUND CARE) ×2 IMPLANT
ELECT REM PT RETURN 9FT ADLT (ELECTROSURGICAL) ×2
ELECTRODE REM PT RTRN 9FT ADLT (ELECTROSURGICAL) ×1 IMPLANT
GLOVE BIOGEL PI IND STRL 6.5 (GLOVE) ×2 IMPLANT
GLOVE BIOGEL PI IND STRL 9 (GLOVE) ×1 IMPLANT
GLOVE BIOGEL PI INDICATOR 6.5 (GLOVE) ×2
GLOVE BIOGEL PI INDICATOR 9 (GLOVE) ×1
GLOVE ORTHOPEDIC STR SZ6.5 (GLOVE) ×2 IMPLANT
GLOVE SURG ORTHO 9.0 STRL STRW (GLOVE) ×2 IMPLANT
GOWN PREVENTION PLUS XLARGE (GOWN DISPOSABLE) ×2 IMPLANT
GOWN SRG XL XLNG 56XLVL 4 (GOWN DISPOSABLE) ×2 IMPLANT
GOWN STRL NON-REIN XL XLG LVL4 (GOWN DISPOSABLE) ×2
KIT BASIN OR (CUSTOM PROCEDURE TRAY) ×2 IMPLANT
KIT ROOM TURNOVER OR (KITS) ×2 IMPLANT
MANIFOLD NEPTUNE II (INSTRUMENTS) ×2 IMPLANT
NS IRRIG 1000ML POUR BTL (IV SOLUTION) ×2 IMPLANT
PACK ORTHO EXTREMITY (CUSTOM PROCEDURE TRAY) ×2 IMPLANT
PAD ARMBOARD 7.5X6 YLW CONV (MISCELLANEOUS) ×4 IMPLANT
PAD CAST 4YDX4 CTTN HI CHSV (CAST SUPPLIES) ×2 IMPLANT
PADDING CAST COTTON 4X4 STRL (CAST SUPPLIES) ×2
SPONGE GAUZE 4X4 12PLY (GAUZE/BANDAGES/DRESSINGS) ×2 IMPLANT
SPONGE LAP 18X18 X RAY DECT (DISPOSABLE) ×2 IMPLANT
STAPLER VISISTAT 35W (STAPLE) IMPLANT
SUT ETHILON 2 0 PSLX (SUTURE) ×8 IMPLANT
SUT VIC AB 2-0 CTB1 (SUTURE) ×2 IMPLANT
TOWEL OR 17X24 6PK STRL BLUE (TOWEL DISPOSABLE) ×2 IMPLANT
TOWEL OR 17X26 10 PK STRL BLUE (TOWEL DISPOSABLE) ×2 IMPLANT
TUBE CONNECTING 12X1/4 (SUCTIONS) ×2 IMPLANT
WATER STERILE IRR 1000ML POUR (IV SOLUTION) ×2 IMPLANT
YANKAUER SUCT BULB TIP NO VENT (SUCTIONS) ×2 IMPLANT

## 2013-08-03 NOTE — Progress Notes (Signed)
OOb to bathroom, refuse to use urinal.  Has chuck pad to left foot and  to BR with pad to foot.  Explained to patient it is against hospital policy and he could fall due to slippery pad.  Refused to take chuck off foot and adimant about going to BR with pad to foot and without rt prosthesis. Helped to BR using walker.  Continue to watch.

## 2013-08-03 NOTE — Interval H&P Note (Signed)
History and Physical Interval Note:  08/03/2013 4:55 PM  Gabriel Howe  has presented today for surgery, with the diagnosis of Left Foot 5th MTH Osteomyelitis  The various methods of treatment have been discussed with the patient and family. After consideration of risks, benefits and other options for treatment, the patient has consented to  Procedure(s) with comments: Left Midfoot Amputation (Left) - Left Foot Transmetatarsal Amputation as a surgical intervention .  The patient's history has been reviewed, patient examined, no change in status, stable for surgery.  I have reviewed the patient's chart and labs.  Questions were answered to the patient's satisfaction.     DUDA,MARCUS V

## 2013-08-03 NOTE — OR Nursing (Signed)
Spoke with Dr. Michelle Piper and requested sign out in order to take pt back to [phone]

## 2013-08-03 NOTE — Progress Notes (Signed)
Patient ID: Gabriel Howe, male   DOB: 02/16/1961, 52 y.o.   MRN: [id number] Patient with osteomyelitis and abscess left forefoot. Patient will be added on for transmetatarsal amputation left foot later today. N.p.o. today. 

## 2013-08-03 NOTE — Progress Notes (Deleted)
Utilization review completed.  

## 2013-08-03 NOTE — Anesthesia Postprocedure Evaluation (Signed)
  Anesthesia Post-op Note  Patient: Gabriel Howe  Procedure(s) Performed: Procedure(s) with comments: Left Midfoot Amputation (Left) - Left Foot Transmetatarsal Amputation  Patient Location: PACU  Anesthesia Type:General  Level of Consciousness: awake, alert  and oriented  Airway and Oxygen Therapy: Patient Spontanous Breathing and Patient connected to nasal cannula oxygen  Post-op Pain: mild  Post-op Assessment: Post-op Vital signs reviewed  Post-op Vital Signs: Reviewed  Complications: No apparent anesthesia complications

## 2013-08-03 NOTE — Progress Notes (Signed)
ANTICOAGULATION CONSULT NOTE - Initial Consult &  ANTIBIOITIC CONSULT NOTE - Follow-up  Pharmacy Consult for Warfarin; vancomycin Indication: VTE prophylaxis; L-foot wound infection  Allergies  Allergen Reactions  . Bactroban [Mupirocin Calcium] Hives  . Shellfish Allergy Rash   Patient Measurements: Height: 6\' 3"  (190.5 cm) Weight: 380 lb 1.2 oz (172.4 kg) IBW/kg (Calculated) : 84.5 Vital Signs: Temp: 98.5 F (36.9 C) (10/06 1905) Temp src: Oral (10/06 1905) BP: 121/72 mmHg (10/06 1905) Pulse Rate: 78 (10/06 1905) Labs:  Recent Labs  08/01/13 1222 08/02/13 0528 08/03/13 0645  HGB 13.1 10.9*  --   HCT 36.5* 31.2*  --   PLT 237 236  --   CREATININE 0.90 0.77 0.61  TROPONINI <0.30  --   --     Estimated Creatinine Clearance: 185 ml/min (by C-G formula based on Cr of 0.61).   Medical History: Past Medical History  Diagnosis Date  . Bronchitis     hx of;last time about 72yrs ago  . Neuromuscular disorder     diabetic neuropathy in feet  . Arthritis   . Hyperlipidemia     takes Atorvastatin daily  . Diabetes mellitus without complication     takes Metformin bid and Lantus nightly    Medications:  Scheduled:  . aspirin EC  81 mg Oral Daily  . atorvastatin  10 mg Oral QHS  . heparin  5,000 Units Subcutaneous Q8H  . insulin aspart  0-20 Units Subcutaneous TID WC  . insulin aspart  0-5 Units Subcutaneous QHS  . insulin aspart  3 Units Subcutaneous TID WC  . insulin glargine  26 Units Subcutaneous QHS  . piperacillin-tazobactam (ZOSYN)  IV  3.375 g Intravenous Q8H  . sodium chloride  3 mL Intravenous Q12H  . vancomycin  1,500 mg Intravenous Q12H   Assessment: 52 YOM with L foot infection s/p L-midfoot amputation for osteomyelitis with gangrenous ulceration to continue on vancomycin and Zosyn and to start warfarin for VTE prophylaxis. Patient is also on SQ heparin.   Anticoagulation: New start warfarin for VTE prophylaxis. Patient was not on anticoagulation  prior to admission. Baseline INR is pending (normal in June 2014). LFTs wnl. Albumin 2.9. Patient is on antibiotics but no other significant potentiators of warfarin. Warfarin score >8.  Antibiotics: Day # 3 of vancomycin and Zosyn.  Tm 99.4, WBC trending down. SCr 0.61, estCrCl >18ml/min.  Vancomycin Trough = 5, SUB-therapeutic for goal on 1500mg  IV q12h.  *Note prior dosing in Feb 2014. VT of 6 on 1500 q12h. Dose incr to 2g IV q12h and VT 12.3 -- but also note that VT was drawn 6 minutes after vancomycin had started infusing so unsure how to interpret this level.   Ke 0.094, T1/2 = 7 hrs  Goal of Therapy:  INR 2-3 Vancomycin trough 15-20   Plan:  1. Warfarin 10mg  po x1 tonight.  2. Daily PT/INR. 3. Increase vancomycin to 1250 mg IV every 6 hours.  4. Plan for vancomycin trough at steady state if continue.  5. Warfarin book and video to start education.   Link Snuffer, PharmD, BCPS Clinical Pharmacist [phone] 08/03/2013,7:49 PM

## 2013-08-03 NOTE — Anesthesia Preprocedure Evaluation (Addendum)
Anesthesia Evaluation  Patient identified by MRN, date of birth, ID band Patient awake    Reviewed: Allergy & Precautions, H&P , NPO status , Patient's Chart, lab work & pertinent test results  Airway Mallampati: I TM Distance: >3 FB Neck ROM: full    Dental   Pulmonary          Cardiovascular Rhythm:regular Rate:Normal     Neuro/Psych  Neuromuscular disease    GI/Hepatic   Endo/Other  diabetes, Poorly Controlled, Type 2, Insulin Dependent and Oral Hypoglycemic Agents  Renal/GU      Musculoskeletal   Abdominal   Peds  Hematology   Anesthesia Other Findings   Reproductive/Obstetrics                          Anesthesia Physical Anesthesia Plan  ASA: III  Anesthesia Plan: General   Post-op Pain Management:    Induction: Intravenous  Airway Management Planned: Oral ETT and LMA  Additional Equipment:   Intra-op Plan:   Post-operative Plan: Extubation in OR  Informed Consent: I have reviewed the patients History and Physical, chart, labs and discussed the procedure including the risks, benefits and alternatives for the proposed anesthesia with the patient or authorized representative who has indicated his/her understanding and acceptance.     Plan Discussed with: CRNA, Anesthesiologist and Surgeon  Anesthesia Plan Comments:        Anesthesia Quick Evaluation

## 2013-08-03 NOTE — Op Note (Signed)
OPERATIVE REPORT  DATE OF SURGERY: 08/03/2013  PATIENT:  Gabriel Howe,  52 y.o. 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 His previous third ray amputation is healed well but with the new osteomyelitis abscess ulceration and necrotic tissue patient presents at this time for transmetatarsal amputation. Risks and benefits were discussed including infection neurovascular injury nonhealing of the wound need for higher level amputation. Patient states he understands and wishes to proceed at this time. Description of procedure patient was brought to the operating room and underwent a general anesthetic. After adequate levels of anesthesia were obtained patient's left lower extremity was prepped using DuraPrep draped into a sterile field an Ioban was used to cover all the necrotic ulcerative tissue. A fishmouth incision was made this carried down in a transmetatarsal amputation was performed. Electrocautery was used for hemostasis. Normal saline was used for irrigation. There was a small 2 cm area on the plantar aspect of the foot with the flap having necrotic tissue this is debrided back to healthy viable tissue. I am concerned this may be an area that may breakdown in the future. After irrigation cleansing the incision was closed without tension the skin using 2-0 nylon. The wound was covered with Adaptic  orthopedic sponges AB dressing Kerlix and Coban. Patient was extubated taken to the PACU in stable condition. Plan to continue IV antibiotics.  PLAN OF CARE: Admit to inpatient   PATIENT DISPOSITION:  PACU - hemodynamically stable.   Nadara Mustard, MD 08/03/2013 5:53 PM

## 2013-08-03 NOTE — Transfer of Care (Signed)
Immediate Anesthesia Transfer of Care Note  Patient: Gabriel Howe  Procedure(s) Performed: Procedure(s) with comments: Left Midfoot Amputation (Left) - Left Foot Transmetatarsal Amputation  Patient Location: PACU  Anesthesia Type:General  Level of Consciousness: awake  Airway & Oxygen Therapy: Patient Spontanous Breathing and Patient connected to nasal cannula oxygen  Post-op Assessment: Report given to PACU RN and Post -op Vital signs reviewed and stable  Post vital signs: stable  Complications: No apparent anesthesia complications

## 2013-08-03 NOTE — Care Management Note (Signed)
    Page 1 of 1   08/03/2013     10:44:11 AM   CARE MANAGEMENT NOTE 08/03/2013  Patient:  Gabriel Howe, Gabriel Howe   Account Number:  [id number]  Date Initiated:  08/03/2013  Documentation initiated by:  Donn Pierini  Subjective/Objective Assessment:   Pt admitted with sepsis/cellulitis/left foot osteomyelitis     Action/Plan:   PTA pt lived at home alone, will need PT/OT evals post op for d/c recommendations   Anticipated DC Date:  08/06/2013   Anticipated DC Plan:  HOME W HOME HEALTH SERVICES      DC Planning Services  CM consult      Choice offered to / List presented to:             Status of service:  In process, will continue to follow Medicare Important Message given?   (If response is "NO", the following Medicare IM given date fields will be blank) Date Medicare IM given:   Date Additional Medicare IM given:    Discharge Disposition:    Per UR Regulation:  Reviewed for med. necessity/level of care/duration of stay  If discussed at Long Length of Stay Meetings, dates discussed:    Comments:  08/03/13- 1040- Silva Bandy Jerrica Thorman RN,BSN [phone] Pt for OR later today- Patient with osteomyelitis and abscess left forefoot. Patient will be added on for transmetatarsal amputation left foot- would benefit from PT/OT evals post op for recommendations at discharge- pt lives alone.

## 2013-08-03 NOTE — Progress Notes (Signed)
Utilization review completed.  

## 2013-08-03 NOTE — Progress Notes (Signed)
[facility] Progress Note [place] TEAM 1 - Stepdown/ICU Gabriel Howe ZOX:[id number] DOB: [birth year] DOA: 08/01/2013 PCP: Vickey Sages, MD  Admit HPI / Brief Narrative: 52 y.o. male w/ a past medical history of poorly controlled diabetes with last hemoglobin A1c 12.5 in February 2014.  He is status post I&D of chronic left foot wounds twice this year by Dr. Lajoyce Corners, and has been on doxy since March.  Patient had previously been scheduled for an amputation of the left foot when he came in w/ c/o not being able to bear weight on that foot for 2 days with associated dyspnea on exertion, fevers with chills, and nausea with no vomiting.  In the ED:  Initially his blood pressure was borderline low - he got 2 L of normal saline and his blood pressure stabilized. He continued to be tachycardic - a basic metabolic panel revealed hyponatremia hypochloremia and hypokalemia w/ a lactic acid of 2.0 - a white count of 13 was noted and a left foot x-ray revealed osteomyelitis. EKG noted sinus tachycardia with no ST segment changes.   Assessment/Plan:  Sepsis due to diabetic foot (HR 101, RR 21, WBC 13.4) Sepsis physiology has resolved - remains hemodynamically stable - monitor after OR today   Left diabetic foot with osteomyelitis Prior history of surgical I&Ds as well as digit amputations - to OR today per Ortho  Uncontrolled diabetes mellitus CBG still poorly controlled - follow post op as further adjustment in tx paln likely to be required   Hyponatremia Likely simply related to volume status - imroving   Hypokalemia Supplement and follow - magnesium normal   Status post right below knee amputation  Morbid obesity  HLD Cont home medical tx  Code Status: FULL Family Communication: no family present at time of exam Disposition Plan: SDU   Consultants: Orthopedic surgery  Procedures: 10/5 venous duplex negative for DVT bilateral lower  extremities  Antibiotics: Vancomycin 10/4>> Zosyn 10/4 >>  DVT prophylaxis: SQ heparin  HPI/Subjective: The patient is alert and conversant and quite pleasant.  He has no new complaints.  He denies chest pain shortness of breath fevers chills nausea or vomiting.   Objective: Blood pressure 122/67, pulse 74, temperature 98.2 F (36.8 C), temperature source Oral, resp. rate 21, height 6\' 3"  (1.905 m), weight 172.4 kg (380 lb 1.2 oz), SpO2 100.00%.  Intake/Output Summary (Last 24 hours) at 08/03/13 1524 Last data filed at 08/03/13 1300  Gross per 24 hour  Intake 3158.33 ml  Output      2 ml  Net 3156.33 ml   Exam: General: No acute respiratory distress Lungs: Clear to auscultation bilaterally without wheezes or crackles Cardiovascular: Regular rate and rhythm without murmur gallop or rub  Abdomen: Nontender, nondistended, soft, bowel sounds positive, no rebound, no ascites, no appreciable mass Extremities: Left lower extremity is extensively dressed with pungent odor consistent with necrotic tissue  Data Reviewed: Basic Metabolic Panel:  Recent Labs Lab 08/01/13 1222 08/02/13 0528 08/03/13 0645  NA 128* 133* 134*  K 3.4* 3.3* 3.5  CL 90* 98 101  CO2 22 22 23   GLUCOSE 245* 180* 171*  BUN 11 10 8   CREATININE 0.90 0.77 0.61  CALCIUM 8.8 8.1* 8.3*  MG  --   --  2.0   Liver Function Tests:  Recent Labs Lab 08/01/13 1222  AST 13  ALT 7  ALKPHOS 88  BILITOT 0.7  PROT 8.4*  ALBUMIN 2.9*   CBC:  Recent Labs Lab  08/01/13 1222 08/02/13 0528  WBC 13.4* 11.9*  NEUTROABS 10.0*  --   HGB 13.1 10.9*  HCT 36.5* 31.2*  MCV 78.2 78.6  PLT 237 236   Cardiac Enzymes:  Recent Labs Lab 08/01/13 1222  TROPONINI <0.30   BNP (last 3 results)  Recent Labs  08/01/13 1222  PROBNP 100.2   CBG:  Recent Labs Lab 08/02/13 1218 08/02/13 1648 08/02/13 2145 08/03/13 0734 08/03/13 1132  GLUCAP 197* 196* 178* 164* 170*    Recent Results (from the past 240  hour(s))  CULTURE, BLOOD (ROUTINE X 2)     Status: None   Collection Time    08/01/13 12:22 PM      Result Value Range Status   Specimen Description BLOOD RIGHT ARM   Final   Special Requests BOTTLES DRAWN AEROBIC AND ANAEROBIC 8CC   Final   Culture  Setup Time     Final   Value: 08/01/2013 19:06     Performed at [facility]   Culture     Final   Value:        BLOOD CULTURE RECEIVED NO GROWTH TO DATE CULTURE WILL BE HELD FOR 5 DAYS BEFORE ISSUING A FINAL NEGATIVE REPORT     Performed at [facility]   Report Status PENDING   Incomplete  WOUND CULTURE     Status: None   Collection Time    08/01/13  1:27 PM      Result Value Range Status   Specimen Description WOUND LEFT FOOT   Final   Special Requests NONE   Final   Gram Stain PENDING   Incomplete   Culture     Final   Value: MODERATE GROUP B STREP(S.AGALACTIAE)ISOLATED     Note: TESTING AGAINST S. AGALACTIAE NOT ROUTINELY PERFORMED DUE TO PREDICTABILITY OF AMP/PEN/VAN SUSCEPTIBILITY.     Performed at [facility]   Report Status PENDING   Incomplete  CULTURE, BLOOD (ROUTINE X 2)     Status: None   Collection Time    08/01/13  2:00 PM      Result Value Range Status   Specimen Description BLOOD LEFT ARM   Final   Special Requests BOTTLES DRAWN AEROBIC AND ANAEROBIC B 10CC R 5CC   Final   Culture  Setup Time     Final   Value: 08/01/2013 19:07     Performed at [facility]   Culture     Final   Value:        BLOOD CULTURE RECEIVED NO GROWTH TO DATE CULTURE WILL BE HELD FOR 5 DAYS BEFORE ISSUING A FINAL NEGATIVE REPORT     Performed at [facility]   Report Status PENDING   Incomplete  URINE CULTURE     Status: None   Collection Time    08/01/13  4:15 PM      Result Value Range Status   Specimen Description URINE, CLEAN CATCH   Final   Special Requests NONE   Final   Culture  Setup Time     Final   Value: 08/01/2013 23:14     Performed at [facility] Count      Final   Value: NO GROWTH     Performed at [facility]   Culture     Final   Value: NO GROWTH     Performed at [facility]   Report Status 08/02/2013 FINAL   Final  MRSA PCR  SCREENING     Status: None   Collection Time    08/02/13  5:38 AM      Result Value Range Status   MRSA by PCR NEGATIVE  NEGATIVE Final   Comment:            The GeneXpert MRSA Assay (FDA     approved for NASAL specimens     only), is one component of a     comprehensive MRSA colonization     surveillance program. It is not     intended to diagnose MRSA     infection nor to guide or     monitor treatment for     MRSA infections.     Studies:  Recent x-ray studies have been reviewed in detail by the Attending Physician  Scheduled Meds:  Scheduled Meds: . [facility] HOLD] aspirin EC  81 mg Oral Daily  . [[facility] HOLD] atorvastatin  10 mg Oral QHS  . [MAR HOLD] heparin  5,000 Units Subcutaneous Q8H  . [MAR HOLD] insulin aspart  0-20 Units Subcutaneous TID WC  . [MAR HOLD] insulin aspart  0-5 Units Subcutaneous QHS  . [[facility] HOLD] insulin aspart  3 Units Subcutaneous TID WC  . [[facility] HOLD] insulin glargine  26 Units Subcutaneous QHS  . [[facility] HOLD] piperacillin-tazobactam (ZOSYN)  IV  3.375 g Intravenous Q8H  . [MAR HOLD] sodium chloride  3 mL Intravenous Q12H  . [facility] HOLD] vancomycin  1,500 mg Intravenous Q12H    Time spent on care of this patient: 35 mins   [facility] T  [facility] Office  [phone] Pager - Text Page per Loretha Stapler as per below:  On-Call/Text Page:      [email or web address]      password [email or web address]  If 7PM-7AM, please contact night-coverage [email or web address] Password [email or web address] 08/03/2013, 3:24 PM   LOS: 2 days

## 2013-08-03 NOTE — Progress Notes (Signed)
Patient being transported to OR.  No complaints.

## 2013-08-03 NOTE — H&P (View-Only) (Signed)
Patient ID: Gabriel Howe, male   DOB: [birth year], 52 y.o.   MRN: [id number] Patient with osteomyelitis and abscess left forefoot. Patient will be added on for transmetatarsal amputation left foot later today. N.p.o. today.

## 2013-08-03 NOTE — Preoperative (Signed)
Beta Blockers   Reason not to administer Beta Blockers:Not Applicable 

## 2013-08-04 DIAGNOSIS — A419 Sepsis, unspecified organism: Secondary | ICD-10-CM

## 2013-08-04 LAB — BASIC METABOLIC PANEL
Calcium: 8.1 mg/dL — ABNORMAL LOW (ref 8.4–10.5)
Creatinine, Ser: 0.63 mg/dL (ref 0.50–1.35)
GFR calc non Af Amer: >90 mL/min (ref >90)
Sodium: 136 mEq/L (ref 135–145)

## 2013-08-04 LAB — CBC
MCH: 27 pg (ref 26.0–34.0)
MCV: 79.9 fL (ref 78.0–100.0)
Platelets: 261 10*3/uL (ref 150–400)
RBC: 3.59 MIL/uL — ABNORMAL LOW (ref 4.22–5.81)
RDW: 14.1 % (ref 11.5–15.5)
WBC: 10.2 10*3/uL (ref 4.0–10.5)

## 2013-08-04 LAB — WOUND CULTURE

## 2013-08-04 LAB — GLUCOSE, CAPILLARY
Glucose-Capillary: 128 mg/dL — ABNORMAL HIGH (ref 70–99)
Glucose-Capillary: 166 mg/dL — ABNORMAL HIGH (ref 70–99)

## 2013-08-04 LAB — PROTIME-INR: INR: 1.23 (ref 0.00–1.49)

## 2013-08-04 MED ORDER — WARFARIN SODIUM 10 MG PO TABS
10.0000 mg | ORAL_TABLET | Freq: Once | ORAL | Status: AC
  Administered 2013-08-04: 10 mg via ORAL
  Filled 2013-08-04: qty 1

## 2013-08-04 MED ORDER — VANCOMYCIN HCL 10 G IV SOLR
1250.0000 mg | Freq: Four times a day (QID) | INTRAVENOUS | Status: DC
  Administered 2013-08-04 – 2013-08-05 (×4): 1250 mg via INTRAVENOUS
  Filled 2013-08-04 (×8): qty 1250

## 2013-08-04 NOTE — Progress Notes (Signed)
Patient ID: Gabriel Howe, male   DOB: 03/31/1961, 52 y.o.   MRN: [id number] Postoperative day 1 left transmetatarsal amputation. Anticipate patient will need at least 24 hours of IV antibiotics postoperatively and then he may be discharged to home on oral antibiotics for 4 weeks. Patient could be discharged on doxycycline 100 mg by mouth twice a day. We will keep the dressing clean and dry until  followup in the office in 2 weeks.

## 2013-08-04 NOTE — Progress Notes (Signed)
Paged ortho tech for Left Lateral shoe per order by Dr. Lajoyce Corners [phone]

## 2013-08-04 NOTE — Progress Notes (Signed)
Report called to [name] RN. Pt. To transfer via wheelchair by NT. All belongings sent with patient, including cell phone, prosthesis, sleeves, ortho shoe. Meds and chart sent with patient. eICU and CMT notified of Med/surg transfer. VSS. Will continue to monitor.

## 2013-08-04 NOTE — Evaluation (Signed)
Physical Therapy Evaluation and Discharge Patient Details Name: Gabriel Howe MRN: [id number] DOB: Jul 19, 1961 Today's Date: 08/04/2013 Time: 4098-1191 PT Time Calculation (min): 26 min  PT Assessment / Plan / Recommendation History of Present Illness  Pt s/p L transmetarsal amp. Pt with old R BKA with prosthesis  Clinical Impression  Pt functioning at supervision however can be mod I. Pt safe to d/c home with assist of mother. Pt indep with donning of L post op shoe and R prosthesis. Pt aware of minimalizing WBing on L LE. Pt prefers using crutches, contacted ortho tech to order bariatric crutches for home. Pt with no further acute PT needs. PT signing off. Please reconsult if needed in future.    PT Assessment  Patent does not need any further PT services    Follow Up Recommendations  No PT follow up;Supervision/Assistance - 24 hour    Does the patient have the potential to tolerate intense rehabilitation      Barriers to Discharge        Equipment Recommendations  Crutches (called ortho tech for bariatric crutches, pt 378#s,6'8")    Recommendations for Other Services     Frequency      Precautions / Restrictions Precautions Precautions: Fall Required Braces or Orthoses:  (L post op shoe, R prosthesis) Restrictions Weight Bearing Restrictions: Yes LLE Weight Bearing: Partial weight bearing LLE Partial Weight Bearing Percentage or Pounds: per MD minimalize L LE WB, through heel only for transfers   Pertinent Vitals/Pain Denies pain      Mobility  Bed Mobility Bed Mobility: Supine to Sit Supine to Sit: 6: Modified independent (Device/Increase time) Details for Bed Mobility Assistance: safe technique Transfers Transfers: Sit to Stand;Stand to Sit Sit to Stand: 5: Supervision Stand to Sit: 5: Supervision;With upper extremity assist;To chair/3-in-1 Details for Transfer Assistance: supervision due to first time up, pt independent with donning of R BKA and L post op  shoe Ambulation/Gait Ambulation/Gait Assistance: 5: Supervision Ambulation Distance (Feet): 10 Feet Assistive device: Rolling walker Ambulation/Gait Assistance Details: v/c's to maintain L heel WBIng however pt with neuropathy and can't feel foot Gait Pattern: Step-through pattern Gait velocity: wfl General Gait Details: no episodes of LOB, pt steady and safe Stairs: No    Exercises     PT Diagnosis:    PT Problem List:   PT Treatment Interventions:       PT Goals(Current goals can be found in the care plan section) Acute Rehab PT Goals Patient Stated Goal: home asap PT Goal Formulation: No goals set, d/c therapy  Visit Information  Last PT Received On: 08/04/13 Assistance Needed: +1 History of Present Illness: Pt s/p L transmetarsal amp. Pt with old R BKA with prosthesis       Prior Functioning  Home Living Family/patient expects to be discharged to:: Private residence Living Arrangements: Alone Available Help at Discharge: Family;Available 24 hours/day (mother coming to stay with patient x 3 weeks) Type of Home: House Home Access: Stairs to enter [employer] of Steps: 2 Entrance Stairs-Rails: None Home Layout: One level Home Equipment: Crutches;Wheelchair - [employer] - 2 wheels Additional Comments: pt's mother is coming to stay with him as long as he needs when he first goes home then he will go home with her  Prior Function Level of Independence: Independent 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 Dominant Hand: Right    Cognition  Cognition Arousal/Alertness: Awake/alert Behavior During Therapy: WFL for tasks assessed/performed Overall Cognitive Status: Within Functional Limits for tasks  assessed    Extremity/Trunk Assessment Upper Extremity Assessment Upper Extremity Assessment: Overall WFL for tasks assessed Lower Extremity Assessment Lower Extremity Assessment: RLE deficits/detail;LLE  deficits/detail RLE Deficits / Details: old R BKA, good hip/knee ROM LLE Deficits / Details: hip/knee WFL LLE Sensation: history of peripheral neuropathy Cervical / Trunk Assessment Cervical / Trunk Assessment: Normal   Balance Balance Balance Assessed: Yes Static Sitting Balance Static Sitting - Balance Support: No upper extremity supported;Feet supported Static Sitting - Level of Assistance: 7: Independent Static Sitting - Comment/# of Minutes: 5 min to don R prosthesis and L post op shoe  End of Session PT - End of Session Activity Tolerance: Patient tolerated treatment well Patient left: in chair;with call bell/phone within reach Nurse Communication: Mobility status  GP     Marcene Brawn 08/04/2013, 8:21 AM  ,Lewis Shock, PT, DPT Pager #: [phone] Office #: [phone]

## 2013-08-04 NOTE — Progress Notes (Signed)
Coumadin booklet given. Pt. Has been on coumadin in past. Education patient about consistent diet, color of pills are dosage related and tracking INR. Pt. Did not have questions. Booklet left with patient.

## 2013-08-04 NOTE — Progress Notes (Addendum)
[facility] Progress Note [place] TEAM 1 - Stepdown/ICU Gabriel Howe ZOX:[id number] DOB: [birth year] DOA: 08/01/2013 PCP: Vickey Sages, MD  Admit HPI / Brief Narrative: 52 y.o. male w/ a past medical history of poorly controlled diabetes with last hemoglobin A1c 12.5 in February 2014.  He is status post I&D of chronic left foot wounds twice this year by Dr. Lajoyce Corners, and has been on doxy since March.  Patient had previously been scheduled for an amputation of the left foot when he came in w/ c/o not being able to bear weight on that foot for 2 days with associated dyspnea on exertion, fevers with chills, and nausea with no vomiting.  In the ED:  Initially his blood pressure was borderline low - he got 2 L of normal saline and his blood pressure stabilized. He continued to be tachycardic - a basic metabolic panel revealed hyponatremia hypochloremia and hypokalemia w/ a lactic acid of 2.0 - a white count of 13 was noted and a left foot x-ray revealed osteomyelitis. EKG noted sinus tachycardia with no ST segment changes.   Assessment/Plan:  Sepsis due to diabetic foot (HR 101, RR 21, WBC 13.4) Sepsis physiology has resolved - remains hemodynamically stable -  Left diabetic foot with osteomyelitis Prior history of surgical I&Ds as well as digit amputations  - s/p transmetatarsal amputation on 10/6 - ok to d/c tomorrow per surgery  Uncontrolled diabetes mellitus CBG reasonable today  Hyponatremia Likely simply related to volume status - imroving   Hypokalemia Supplement and follow - magnesium normal   Status post right below knee amputation  Morbid obesity  HLD Cont home medical tx  Code Status: FULL Family Communication: wife in room Disposition Plan: transfer to med/surg  Consultants: Orthopedic surgery  Procedures: 10/5 venous duplex negative for DVT bilateral lower extremities  Antibiotics: Vancomycin 10/4>> Zosyn 10/4 >>  DVT prophylaxis: SQ  heparin  HPI/Subjective: The patient is alert and conversant and quite pleasant. Looking forward to going home tomorrow.  Objective: Blood pressure 121/71, pulse 76, temperature 98 F (36.7 C), temperature source Oral, resp. rate 16, height 6\' 3"  (1.905 m), weight 171.7 kg (378 lb 8.5 oz), SpO2 98.00%.  Intake/Output Summary (Last 24 hours) at 08/04/13 1451 Last data filed at 08/04/13 1000  Gross per 24 hour  Intake 1541.17 ml  Output   1120 ml  Net 421.17 ml   Exam: General: No acute respiratory distress Lungs: Clear to auscultation bilaterally without wheezes or crackles Cardiovascular: Regular rate and rhythm without murmur gallop or rub  Abdomen: Nontender, nondistended, soft, bowel sounds positive, no rebound, no ascites, no appreciable mass Extremities: Left lower extremity is extensively dressed - right foot dressed  Data Reviewed: Basic Metabolic Panel:  Recent Labs Lab 08/01/13 1222 08/02/13 0528 08/03/13 0645 08/04/13 0434  NA 128* 133* 134* 136  K 3.4* 3.3* 3.5 3.6  CL 90* 98 101 102  CO2 22 22 23 24   GLUCOSE 245* 180* 171* 129*  BUN 11 10 8 7   CREATININE 0.90 0.77 0.61 0.63  CALCIUM 8.8 8.1* 8.3* 8.1*  MG  --   --  2.0  --    Liver Function Tests:  Recent Labs Lab 08/01/13 1222  AST 13  ALT 7  ALKPHOS 88  BILITOT 0.7  PROT 8.4*  ALBUMIN 2.9*   CBC:  Recent Labs Lab 08/01/13 1222 08/02/13 0528 08/04/13 0434  WBC 13.4* 11.9* 10.2  NEUTROABS 10.0*  --   --   HGB 13.1  10.9* 9.7*  HCT 36.5* 31.2* 28.7*  MCV 78.2 78.6 79.9  PLT 237 236 261   Cardiac Enzymes:  Recent Labs Lab 08/01/13 1222  TROPONINI <0.30   BNP (last 3 results)  Recent Labs  08/01/13 1222  PROBNP 100.2   CBG:  Recent Labs Lab 08/03/13 1506 08/03/13 1818 08/03/13 2056 08/04/13 0831 08/04/13 1203  GLUCAP 123* 113* 158* 128* 166*    Recent Results (from the past 240 hour(s))  CULTURE, BLOOD (ROUTINE X 2)     Status: None   Collection Time    08/01/13  12:22 PM      Result Value Range Status   Specimen Description BLOOD RIGHT ARM   Final   Special Requests BOTTLES DRAWN AEROBIC AND ANAEROBIC 8CC   Final   Culture  Setup Time     Final   Value: 08/01/2013 19:06     Performed at [facility]   Culture     Final   Value:        BLOOD CULTURE RECEIVED NO GROWTH TO DATE CULTURE WILL BE HELD FOR 5 DAYS BEFORE ISSUING A FINAL NEGATIVE REPORT     Performed at [facility]   Report Status PENDING   Incomplete  WOUND CULTURE     Status: None   Collection Time    08/01/13  1:27 PM      Result Value Range Status   Specimen Description WOUND LEFT FOOT   Final   Special Requests NONE   Final   Gram Stain     Final   Value: RARE WBC PRESENT, PREDOMINANTLY PMN     NO SQUAMOUS EPITHELIAL CELLS SEEN     FEW GRAM POSITIVE COCCI     IN PAIRS     Performed at [facility]   Culture     Final   Value: MODERATE GROUP B STREP(S.AGALACTIAE)ISOLATED     Note: TESTING AGAINST S. AGALACTIAE NOT ROUTINELY PERFORMED DUE TO PREDICTABILITY OF AMP/PEN/VAN SUSCEPTIBILITY.     Performed at [facility]   Report Status 08/04/2013 FINAL   Final  CULTURE, BLOOD (ROUTINE X 2)     Status: None   Collection Time    08/01/13  2:00 PM      Result Value Range Status   Specimen Description BLOOD LEFT ARM   Final   Special Requests BOTTLES DRAWN AEROBIC AND ANAEROBIC B 10CC R 5CC   Final   Culture  Setup Time     Final   Value: 08/01/2013 19:07     Performed at [facility]   Culture     Final   Value:        BLOOD CULTURE RECEIVED NO GROWTH TO DATE CULTURE WILL BE HELD FOR 5 DAYS BEFORE ISSUING A FINAL NEGATIVE REPORT     Performed at [facility]   Report Status PENDING   Incomplete  URINE CULTURE     Status: None   Collection Time    08/01/13  4:15 PM      Result Value Range Status   Specimen Description URINE, CLEAN CATCH   Final   Special Requests NONE   Final   Culture  Setup Time     Final   Value:  08/01/2013 23:14     Performed at [facility] Count     Final   Value: NO GROWTH     Performed at [facility]  Final   Value: NO GROWTH     Performed at [facility]   Report Status 08/02/2013 FINAL   Final  MRSA PCR SCREENING     Status: None   Collection Time    08/02/13  5:38 AM      Result Value Range Status   MRSA by PCR NEGATIVE  NEGATIVE Final   Comment:            The GeneXpert MRSA Assay (FDA     approved for NASAL specimens     only), is one component of a     comprehensive MRSA colonization     surveillance program. It is not     intended to diagnose MRSA     infection nor to guide or     monitor treatment for     MRSA infections.     Studies:  Recent x-ray studies have been reviewed in detail by the Attending Physician  Scheduled Meds:  Scheduled Meds: . aspirin EC  81 mg Oral Daily  . atorvastatin  10 mg Oral QHS  . heparin  5,000 Units Subcutaneous Q8H  . insulin aspart  0-20 Units Subcutaneous TID WC  . insulin aspart  0-5 Units Subcutaneous QHS  . insulin aspart  3 Units Subcutaneous TID WC  . insulin glargine  26 Units Subcutaneous QHS  . piperacillin-tazobactam (ZOSYN)  IV  3.375 g Intravenous Q8H  . sodium chloride  3 mL Intravenous Q12H  . vancomycin  1,250 mg Intravenous Q6H  . warfarin  10 mg Oral ONCE-1800  . warfarin   Does not apply Once  . Warfarin - Pharmacist Dosing Inpatient   Does not apply q1800    Time spent on care of this patient: 35 mins   Calvert Cantor, MD  [facility] Office  [phone] Pager - Text Page per Amion as per below:  On-Call/Text Page:      [email or web address]      password [email or web address]  If 7PM-7AM, please contact night-coverage [email or web address] Password [email or web address] 08/04/2013, 2:51 PM   LOS: 3 days

## 2013-08-04 NOTE — Progress Notes (Signed)
Patient refused to wear telemetry to bathroom. Pt. Refused to wear ortho shoe on left foot to bathroom, stating, "I will not wear it at home to walk to the bathroom, I'm not doing it here." Will continue to monitor patient.

## 2013-08-04 NOTE — Progress Notes (Signed)
ANTICOAGULATION CONSULT NOTE - Initial Consult &  ANTIBIOTIC CONSULT NOTE - Follow-up  Pharmacy Consult for Warfarin; vancomycin Indication: VTE prophylaxis; L-foot wound infection  Allergies  Allergen Reactions  . Bactroban [Mupirocin Calcium] Hives  . Shellfish Allergy Rash   Patient Measurements: Height: 6\' 3"  (190.5 cm) Weight: 378 lb 8.5 oz (171.7 kg) IBW/kg (Calculated) : 84.5 Vital Signs: Temp: 98 F (36.7 C) (10/07 0800) Temp src: Oral (10/07 0800) BP: 114/72 mmHg (10/07 0800) Pulse Rate: 76 (10/07 0800) Labs:  Recent Labs  08/01/13 1222 08/02/13 0528 08/03/13 0645 08/03/13 2039 08/04/13 0434  HGB 13.1 10.9*  --   --  9.7*  HCT 36.5* 31.2*  --   --  28.7*  PLT 237 236  --   --  261  LABPROT  --   --   --  14.8 15.2  INR  --   --   --  1.19 1.23  CREATININE 0.90 0.77 0.61  --  0.63  TROPONINI <0.30  --   --   --   --     Estimated Creatinine Clearance: 184.5 ml/min (by C-G formula based on Cr of 0.63).   Assessment: 89 YOM with L foot infection s/p L-midfoot amputation for osteomyelitis with gangrenous ulceration to continue on vancomycin and Zosyn and warfarin started 10/6 for VTE prophylaxis. Patient is also on SQ heparin.   Anticoagulation: Warfarin for VTE prophylaxis. Patient was not on anticoagulation prior to admission. INR with little movement past first dose of coumadin. Patient is on antibiotics but no other significant potentiators of warfarin. Hgb down to 9.7 - probably due to post-op losses. No bleeding noted.  Antibiotics: Vancomycin and Zosyn D#4 for L foot osteo/abscess. S/p transmetatarsal amputation 10/6. Afeb. WBC down to nl. (was on doxy po x 3mos PTA). Noted Ortho note states 24 hrs of IV abx then may change to po doxycycline.  Vanc 10/4 >> 10/6 VT 5 on 1500mg  IV q12h >>changed to 1250mg  IV q6h Zosyn 10/4 >>  10/4 Wound >> moderate Group B strep 10/4 urine >> neg 10/4 blood >> NGTD  Goal of Therapy:  INR 2-3 Vancomycin trough  15-20   Plan:  1) Vanc 1250 mg IV q6h. 2) Zosyn 3.375gm IV q8h. 3) Monitor renal function, cultures, trough tomorrow if continues >24h 4) Coumadin 10mg  po again tonight 5) Daily INR  Christoper Fabian, PharmD, BCPS Clinical pharmacist, pager [phone] 08/04/2013,8:28 AM

## 2013-08-04 NOTE — Progress Notes (Signed)
Orthopedic Tech Progress Note Patient Details:  Gabriel Howe 1961/10/04 [id number] Post op shoe delivered to patient's room for use when weight bearing. PT to apply post op shoe while working with patient.  Ortho Devices Type of Ortho Device: Postop shoe/boot Ortho Device/Splint Location: Left Ortho Device/Splint Interventions: Ordered   [place] R Thompson 08/04/2013, 7:46 AM

## 2013-08-05 DIAGNOSIS — E1142 Type 2 diabetes mellitus with diabetic polyneuropathy: Secondary | ICD-10-CM

## 2013-08-05 DIAGNOSIS — L97509 Non-pressure chronic ulcer of other part of unspecified foot with unspecified severity: Secondary | ICD-10-CM

## 2013-08-05 DIAGNOSIS — E1169 Type 2 diabetes mellitus with other specified complication: Secondary | ICD-10-CM

## 2013-08-05 DIAGNOSIS — A419 Sepsis, unspecified organism: Secondary | ICD-10-CM

## 2013-08-05 DIAGNOSIS — E1149 Type 2 diabetes mellitus with other diabetic neurological complication: Secondary | ICD-10-CM

## 2013-08-05 LAB — GLUCOSE, CAPILLARY
Glucose-Capillary: 147 mg/dL — ABNORMAL HIGH (ref 70–99)
Glucose-Capillary: 120 mg/dL — ABNORMAL HIGH (ref 70–99)

## 2013-08-05 LAB — PROTIME-INR
INR: 1.86 — ABNORMAL HIGH (ref 0.00–1.49)
Prothrombin Time: 20.9 seconds — ABNORMAL HIGH (ref 11.6–15.2)

## 2013-08-05 MED ORDER — POLYETHYLENE GLYCOL 3350 17 G PO PACK: 17.0000 g | PACK | Freq: Every day | ORAL | Status: DC | PRN

## 2013-08-05 MED ORDER — ASPIRIN 81 MG PO TBEC: 81.0000 mg | DELAYED_RELEASE_TABLET | Freq: Every day | ORAL | Status: DC

## 2013-08-05 MED ORDER — OXYCODONE HCL 5 MG PO TABS: 5.0000 mg | ORAL_TABLET | Freq: Three times a day (TID) | ORAL | Status: DC | PRN

## 2013-08-05 MED ORDER — DOXYCYCLINE HYCLATE 50 MG PO CAPS: 100.0000 mg | ORAL_CAPSULE | Freq: Two times a day (BID) | ORAL | Status: DC

## 2013-08-05 MED ORDER — WARFARIN SODIUM 7.5 MG PO TABS: 7.5000 mg | ORAL_TABLET | Freq: Once | ORAL | Status: DC

## 2013-08-05 MED ORDER — WARFARIN SODIUM 7.5 MG PO TABS
7.5000 mg | ORAL_TABLET | Freq: Once | ORAL | Status: DC
  Filled 2013-08-05: qty 1

## 2013-08-05 NOTE — Progress Notes (Signed)
Orthopedic Tech Progress Note Patient Details:  Gabriel Howe [birth year] [id number]  Ortho Devices Type of Ortho Device: Crutches Ortho Device/Splint Location: Left Ortho Device/Splint Interventions: Application   Cammer, Mickie Bail 08/05/2013, 4:28 PM

## 2013-08-05 NOTE — Progress Notes (Signed)
ANTICOAGULATION CONSULT NOTE - Follow-up  Pharmacy Consult for Warfarin Indication: VTE prophylaxis  Allergies  Allergen Reactions  . Bactroban [Mupirocin Calcium] Hives  . Shellfish Allergy Rash   Patient Measurements: Height: 6\' 8"  (203.2 cm) Weight: 380 lb 4.7 oz (172.5 kg) IBW/kg (Calculated) : 96 Vital Signs: Temp: 97.9 F (36.6 C) (10/08 0604) Temp src: Oral (10/08 0604) BP: 110/65 mmHg (10/08 0604) Pulse Rate: 60 (10/08 0604) Labs:  Recent Labs  08/03/13 0645 08/03/13 2039 08/04/13 0434 08/05/13 0530  HGB  --   --  9.7*  --   HCT  --   --  28.7*  --   PLT  --   --  261  --   LABPROT  --  14.8 15.2 20.9*  INR  --  1.19 1.23 1.86*  CREATININE 0.61  --  0.63  --     Estimated Creatinine Clearance: 195.6 ml/min (by C-G formula based on Cr of 0.63).  Assessment: 73 YOM continues on warfarin started 10/6 for VTE prophylaxis. Patient is also on SQ heparin. INR now responding to coumadin. INR is 1.86 today. No bleeding noted.   Noted plans to discharge on doxycycline. Doxycycline + warfarin can increase the INR. Will need to take into consideration managing outpatient coumadin.   Goal of Therapy: INR 2-3   Plan:  1. If patient is here tonight and not yet started on doxycycline - will give warfarin 7.5mg  PO x 1 tonight 2. If patient is discharged and started on doxycycline, would recommend warfarin 5mg  daily with INR check on Friday.  3. F/u AM INR if still here tomorrow 4. DC heparin SQ when INR>2  Lysle Pearl, PharmD, BCPS Pager # [phone] 08/05/2013 9:12 AM

## 2013-08-05 NOTE — Discharge Summary (Signed)
Physician Discharge Summary  Gabriel Howe ZOX:[id number] DOB: September 15, 1961 DOA: 08/01/2013  PCP: Vickey Sages, MD  Admit date: 08/01/2013 Discharge date: 08/05/2013  Time spent: 40 minutes  Recommendations for Outpatient Follow-up:  1. Patient being discharged on doxycycline 100 mg by mouth twice a day, please followup on foot wound  Discharge Diagnoses:  Principal Problem:   Sepsis associated hypotension Active Problems:   DM2 (diabetes mellitus, type 2)   Cellulitis and abscess of toe of left foot   Hyponatremia   Hypokalemia   Leukocytosis   Discharge Condition: Stable/improved  Diet recommendation: Heart healthy/diabetic diet  Filed Weights   08/03/13 0349 08/04/13 0417 08/04/13 2020  Weight: 172.4 kg (380 lb 1.2 oz) 171.7 kg (378 lb 8.5 oz) 172.5 kg (380 lb 4.7 oz)    History of present illness:  Past medical history of poorly controlled diabetes with her last hemoglobin A1c of breath back in February 2014 status post M.D. and amputation on the left foot twice this year by Dr. Lajoyce Corners currently on Doxy been followed by Dr. as an outpatient and was scheduled to get an amputation on Tuesday on that left foot comes in for not being able to bear weight on that foot for 2 days some dyspnea on excisions, fevers some nausea no vomiting denies any chest pain. He relates he's also been having chills.   Hospital Course:  Patient is a pleasant 52 year old with a history of poorly controlled insulin-dependent diabetes mellitus, who has undergone incision and drainage of chronic left foot wounds by Dr. Lajoyce Corners of orthopedic surgery. Patient had been on doxycycline therapy since March of this year. He presented to the emergency department with complaints of worsening pain to his left foot. Initial x-ray showed soft tissue and bony changes on the fifth metatarsal head consistent with osteomyelitis. Patient was started on broad-spectrum empiric antibiotic therapy with IV vancomycin and Zosyn. He  was found to be hypotensive which responded to fluid challenge with normal saline. Dr. 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 Patient tolerated procedure well and there were no immediate complications. Postoperatively he was treated with IV antimicrobial therapy. Dr. Lajoyce Corners recommended changing to oral antibiotic therapy thereafter with doxycycline 100 mg by mouth twice a day for 4 weeks. Other issues during this hospitalization, he presented with electrolyte abnormalities, including hyponatremia and hypokalemia likely secondary to volume depletion, and had been corrected by the time of discharge. Patient felt well enough to go home by 08/05/2013, discharged in stable condition.  Procedures:  Left midfoot amputation, procedure performed on 08/03/2013. Indication for procedure: Left foot fifth MTH osteomyelitis with a gangrenous ulceration of left forefoot.  Consultations:  Orthopedic surgery  Discharge Exam: Filed Vitals:   08/05/13 0604  BP: 110/65  Pulse: 60  Temp: 97.9 F (36.6 C)  Resp: 18    General: Patient in no acute distress, reports feeling well, anxious to go home today. 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  Discharge Instructions  Discharge Orders   Future Orders Complete By Expires   Call MD for:  difficulty breathing, headache or visual disturbances  As directed    Call MD for:  persistant dizziness or light-headedness  As directed    Call MD for:  persistant nausea and vomiting  As directed    Call MD for:  redness, tenderness, or signs of infection (pain, swelling,  redness, odor or green/yellow discharge around incision site)  As directed    Call MD for:  severe uncontrolled pain  As directed    Call MD for:  temperature >100.4  As directed    Diet - low sodium heart healthy  As directed    Discharge instructions  As directed    Scheduling Instructions:     Dressing to be left on for 2 weeks until you are seen by Dr Lajoyce Corners   Comments:     Keep follow up appointments   Increase activity slowly  As directed        Medication List         aspirin 81 MG EC tablet  Take 1 tablet (81 mg total) by mouth daily.     atorvastatin 10 MG tablet  Commonly known as:  LIPITOR  Take 10 mg by mouth at bedtime.     doxycycline 50 MG capsule  Commonly known as:  VIBRAMYCIN  Take 2 capsules (100 mg total) by mouth 2 (two) times daily.     insulin glargine 100 UNIT/ML injection  Commonly known as:  LANTUS  Inject 20 Units into the skin at bedtime.     metFORMIN 500 MG tablet  Commonly known as:  GLUCOPHAGE  Take 500 mg by mouth 2 (two) times daily.     oxyCODONE 5 MG immediate release tablet  Commonly known as:  Oxy IR/ROXICODONE  Take 1 tablet (5 mg total) by mouth every 8 (eight) hours as needed.     polyethylene glycol packet  Commonly known as:  MIRALAX / GLYCOLAX  Take 17 g by mouth daily as needed.     silver sulfADIAZINE 1 % cream  Commonly known as:  SILVADENE  Apply 1 application topically daily.       Allergies  Allergen Reactions  . Bactroban [Mupirocin Calcium] Hives  . Shellfish Allergy Rash       Follow-up Information   Follow up with DUDA,MARCUS V, MD In 1 week.   Specialty:  Orthopedic Surgery   Contact information:   [address] [place] [address] [phone]        The results of significant diagnostics from this hospitalization (including imaging, microbiology, ancillary and laboratory) are listed below for reference.    Significant Diagnostic Studies: Dg Chest 2 View  08/01/2013   CLINICAL DATA:   Left foot wound infection and shortness of breath.  EXAM: CHEST - 2 VIEW  COMPARISON:  09/05/2012  FINDINGS: The heart size and mediastinal contours are within normal limits. There is no evidence of pulmonary edema, consolidation, pneumothorax, nodule or pleural fluid. The visualized skeletal structures are unremarkable.  IMPRESSION: No active disease.   Electronically Signed   By: Irish Lack M.D.   On: 08/01/2013 14:57   Ct Foot Left W Contrast  08/02/2013   CLINICAL DATA:  The evaluate extent of foot infection. Patient unable to have MRI.  EXAM: CT OF THE LEFT FOOT WITH CONTRAST  TECHNIQUE: Multidetector CT imaging was performed following the standard protocol during bolus administration of intravenous contrast.  CONTRAST:  80mL OMNIPAQUE IOHEXOL 300 MG/ML  SOLN  COMPARISON:  Plain films 08/01/2013  FINDINGS: There is evidence of patient's amputation distal to the base of the 3rd metatarsal. There are mild degenerative changes over  the midfoot and hindfoot region as well as 1st MTP joint. There is evidence of patient's soft tissue ulceration adjacent the 5th MTP joint. There is moderate mottled air collections within the soft tissues adjacent the 5th MTP joint extending distally to the interphalangeal joint. Air seen within the intra medullary portion of the 5th proximal phalanx. There is mild bone destruction involving the head of the 5th metatarsal with possible pathologic fracture. Findings are compatible with soft tissue infection and osteomyelitis involving the 5th metatarsal and proximal phalanx. There is moderate associated soft tissue edema over the foot and ankle. No definite soft tissue abscess. There is an old ununited fracture of the 4th distal phalanx. There is evidence of suggesting myositis ossificans in versus heterotopic bone over the medial soft tissues of the ankle/lower leg.  IMPRESSION: Soft tissue edema and air adjacent the 5th MTP joint with intra medullary air involving the 5th  proximal phalanx in as well as destruction of the head of the 5th metatarsal/possible pathologic fracture as findings are compatible with osteomyelitis of the distal 5th metatarsal and 5th proximal phalanx. No definite soft tissue abscess.  Old ununited 4th distal phalanx fracture.  Degenerative changes as described. Moderate size calcaneal spur.  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   Electronically Signed   By: Elberta Fortis M.D.   On: 08/02/2013 11:35   Dg Foot Complete Left  08/01/2013   CLINICAL DATA:  History of diabetes and foot pain and wound  EXAM: LEFT FOOT - COMPLETE 3+ VIEW  COMPARISON:  None.  FINDINGS: There are changes consistent with transmetatarsal amputation of the 3rd toe. Significant degenerative changes are noted in the tarsal bones as well as in the interphalangeal joint of the 1st toe. A bowel deep head of the 5th metatarsal and the proximal phalanx there are some bony destructive changes as well as significant soft tissue air consistent with localized infection and osteomyelitis. No other focal abnormality is seen. Dystrophic calcifications are noted in the distal calf.  IMPRESSION: Soft tissue and bony changes about the 5th metatarsal head consistent with osteomyelitis.   Electronically Signed   By: Alcide Clever M.D.   On: 08/01/2013 14:55    Microbiology: Recent Results (from the past 240 hour(s))  CULTURE, BLOOD (ROUTINE X 2)     Status: None   Collection Time    08/01/13 12:22 PM      Result Value Range Status   Specimen Description BLOOD RIGHT ARM   Final   Special Requests BOTTLES DRAWN AEROBIC AND ANAEROBIC 8CC   Final   Culture  Setup Time     Final   Value: 08/01/2013 19:06     Performed at [facility]   Culture     Final   Value:        BLOOD CULTURE RECEIVED NO GROWTH TO DATE CULTURE WILL BE HELD FOR 5 DAYS BEFORE ISSUING A FINAL NEGATIVE REPORT     Performed at [facility]   Report Status PENDING    Incomplete  WOUND CULTURE     Status: None   Collection Time    08/01/13  1:27 PM      Result Value Range Status   Specimen Description WOUND LEFT FOOT   Final   Special Requests NONE   Final   Gram Stain     Final   Value: RARE WBC PRESENT, PREDOMINANTLY PMN     NO SQUAMOUS EPITHELIAL CELLS SEEN  FEW GRAM POSITIVE COCCI     IN PAIRS     Performed at [facility]   Culture     Final   Value: MODERATE GROUP B STREP(S.AGALACTIAE)ISOLATED     Note: TESTING AGAINST S. AGALACTIAE NOT ROUTINELY PERFORMED DUE TO PREDICTABILITY OF AMP/PEN/VAN SUSCEPTIBILITY.     Performed at [facility]   Report Status 08/04/2013 FINAL   Final  CULTURE, BLOOD (ROUTINE X 2)     Status: None   Collection Time    08/01/13  2:00 PM      Result Value Range Status   Specimen Description BLOOD LEFT ARM   Final   Special Requests BOTTLES DRAWN AEROBIC AND ANAEROBIC B 10CC R 5CC   Final   Culture  Setup Time     Final   Value: 08/01/2013 19:07     Performed at [facility]   Culture     Final   Value:        BLOOD CULTURE RECEIVED NO GROWTH TO DATE CULTURE WILL BE HELD FOR 5 DAYS BEFORE ISSUING A FINAL NEGATIVE REPORT     Performed at [facility]   Report Status PENDING   Incomplete  URINE CULTURE     Status: None   Collection Time    08/01/13  4:15 PM      Result Value Range Status   Specimen Description URINE, CLEAN CATCH   Final   Special Requests NONE   Final   Culture  Setup Time     Final   Value: 08/01/2013 23:14     Performed at [facility] Count     Final   Value: NO GROWTH     Performed at [facility]   Culture     Final   Value: NO GROWTH     Performed at [facility]   Report Status 08/02/2013 FINAL   Final  MRSA PCR SCREENING     Status: None   Collection Time    08/02/13  5:38 AM      Result Value Range Status   MRSA by PCR NEGATIVE  NEGATIVE Final   Comment:            The GeneXpert MRSA Assay (FDA     approved  for NASAL specimens     only), is one component of a     comprehensive MRSA colonization     surveillance program. It is not     intended to diagnose MRSA     infection nor to guide or     monitor treatment for     MRSA infections.     Labs: Basic Metabolic Panel:  Recent Labs Lab 08/01/13 1222 08/02/13 0528 08/03/13 0645 08/04/13 0434  NA 128* 133* 134* 136  K 3.4* 3.3* 3.5 3.6  CL 90* 98 101 102  CO2 22 22 23 24   GLUCOSE 245* 180* 171* 129*  BUN 11 10 8 7   CREATININE 0.90 0.77 0.61 0.63  CALCIUM 8.8 8.1* 8.3* 8.1*  MG  --   --  2.0  --    Liver Function Tests:  Recent Labs Lab 08/01/13 1222  AST 13  ALT 7  ALKPHOS 88  BILITOT 0.7  PROT 8.4*  ALBUMIN 2.9*   No results found for this basename: LIPASE, AMYLASE,  in the last 168 hours No results found for this basename: AMMONIA,  in the last 168 hours CBC:  Recent Labs Lab  08/01/13 1222 08/02/13 0528 08/04/13 0434  WBC 13.4* 11.9* 10.2  NEUTROABS 10.0*  --   --   HGB 13.1 10.9* 9.7*  HCT 36.5* 31.2* 28.7*  MCV 78.2 78.6 79.9  PLT 237 236 261   Cardiac Enzymes:  Recent Labs Lab 08/01/13 1222  TROPONINI <0.30   BNP: BNP (last 3 results)  Recent Labs  08/01/13 1222  PROBNP 100.2   CBG:  Recent Labs Lab 08/04/13 1203 08/04/13 1703 08/04/13 2132 08/05/13 0804 08/05/13 1203  GLUCAP 166* 146* 135* 147* 120*       Signed:  Dreyton Howe  [facility] 08/05/2013, 1:16 PM

## 2013-08-06 ENCOUNTER — Encounter (HOSPITAL_COMMUNITY): Payer: Self-pay | Admitting: Orthopedic Surgery

## 2013-08-07 LAB — CULTURE, BLOOD (ROUTINE X 2): Culture: NO GROWTH

## 2013-08-17 ENCOUNTER — Other Ambulatory Visit (HOSPITAL_COMMUNITY): Payer: Self-pay | Admitting: Orthopedic Surgery

## 2013-08-18 ENCOUNTER — Encounter (HOSPITAL_COMMUNITY): Payer: Self-pay | Admitting: Pharmacist

## 2013-08-18 ENCOUNTER — Encounter (HOSPITAL_COMMUNITY): Payer: Self-pay | Admitting: *Deleted

## 2013-08-18 MED ORDER — DEXTROSE 5 % IV SOLN
3.0000 g | INTRAVENOUS | Status: AC
  Administered 2013-08-19: 3 g via INTRAVENOUS
  Filled 2013-08-18: qty 3000

## 2013-08-18 NOTE — Progress Notes (Signed)
08/18/13 1614  OBSTRUCTIVE SLEEP APNEA  Have you ever been diagnosed with sleep apnea through a sleep study? No  Do you snore loudly (loud enough to be heard through closed doors)?  0  Do you often feel tired, fatigued, or sleepy during the daytime? 1  Has anyone observed you stop breathing during your sleep? 0  Do you have, or are you being treated for high blood pressure? 0  BMI more than 35 kg/m2? 1  Age over 51 years old? 1  Neck circumference greater than 40 cm/18 inches? 1  Gender: 1  Obstructive Sleep Apnea Score 5  Score 4 or greater  Results sent to PCP

## 2013-08-19 ENCOUNTER — Inpatient Hospital Stay (HOSPITAL_COMMUNITY): Payer: BC Managed Care – PPO | Admitting: Anesthesiology

## 2013-08-19 ENCOUNTER — Encounter (HOSPITAL_COMMUNITY): Payer: BC Managed Care – PPO | Admitting: Anesthesiology

## 2013-08-19 ENCOUNTER — Encounter (HOSPITAL_COMMUNITY): Payer: Self-pay | Admitting: General Practice

## 2013-08-19 ENCOUNTER — Inpatient Hospital Stay (HOSPITAL_COMMUNITY)
Admission: RE | Admit: 2013-08-19 | Discharge: 2013-08-24 | DRG: 908 | Disposition: A | Discharge status: Home / Self Care | Payer: BC Managed Care – PPO | Source: Ambulatory Visit | Attending: Orthopedic Surgery | Admitting: Orthopedic Surgery

## 2013-08-19 ENCOUNTER — Encounter (HOSPITAL_COMMUNITY)
Admission: RE | Disposition: A | Discharge status: Home / Self Care | Payer: Self-pay | Source: Ambulatory Visit | Attending: Orthopedic Surgery

## 2013-08-19 DIAGNOSIS — T8130XA Disruption of wound, unspecified, initial encounter: Principal | ICD-10-CM | POA: Diagnosis present

## 2013-08-19 DIAGNOSIS — E1142 Type 2 diabetes mellitus with diabetic polyneuropathy: Secondary | ICD-10-CM | POA: Diagnosis present

## 2013-08-19 DIAGNOSIS — E11621 Type 2 diabetes mellitus with foot ulcer: Secondary | ICD-10-CM

## 2013-08-19 DIAGNOSIS — E1149 Type 2 diabetes mellitus with other diabetic neurological complication: Secondary | ICD-10-CM | POA: Diagnosis present

## 2013-08-19 DIAGNOSIS — Z87891 Personal history of nicotine dependence: Secondary | ICD-10-CM

## 2013-08-19 DIAGNOSIS — L02619 Cutaneous abscess of unspecified foot: Secondary | ICD-10-CM | POA: Diagnosis present

## 2013-08-19 DIAGNOSIS — Y849 Medical procedure, unspecified as the cause of abnormal reaction of the patient, or of later complication, without mention of misadventure at the time of the procedure: Secondary | ICD-10-CM | POA: Clinically undetermined

## 2013-08-19 DIAGNOSIS — T8131XA Disruption of external operation (surgical) wound, not elsewhere classified, initial encounter: Secondary | ICD-10-CM | POA: Diagnosis present

## 2013-08-19 DIAGNOSIS — M129 Arthropathy, unspecified: Secondary | ICD-10-CM | POA: Diagnosis present

## 2013-08-19 DIAGNOSIS — S98139A Complete traumatic amputation of one unspecified lesser toe, initial encounter: Secondary | ICD-10-CM

## 2013-08-19 DIAGNOSIS — E785 Hyperlipidemia, unspecified: Secondary | ICD-10-CM | POA: Diagnosis present

## 2013-08-19 DIAGNOSIS — I96 Gangrene, not elsewhere classified: Secondary | ICD-10-CM | POA: Diagnosis present

## 2013-08-19 DIAGNOSIS — M869 Osteomyelitis, unspecified: Secondary | ICD-10-CM | POA: Diagnosis present

## 2013-08-19 HISTORY — DX: Other chronic pain: G89.29

## 2013-08-19 HISTORY — PX: AMPUTATION: SHX166

## 2013-08-19 HISTORY — DX: Dorsalgia, unspecified: M54.9

## 2013-08-19 HISTORY — DX: Shortness of breath: R06.02

## 2013-08-19 HISTORY — PX: BELOW KNEE LEG AMPUTATION: SUR23

## 2013-08-19 HISTORY — DX: Headache: R51

## 2013-08-19 HISTORY — DX: Type 2 diabetes mellitus without complications: E11.9

## 2013-08-19 LAB — GLUCOSE, CAPILLARY
Glucose-Capillary: 195 mg/dL — ABNORMAL HIGH (ref 70–99)
Glucose-Capillary: 185 mg/dL — ABNORMAL HIGH (ref 70–99)
Glucose-Capillary: 150 mg/dL — ABNORMAL HIGH (ref 70–99)

## 2013-08-19 LAB — COMPREHENSIVE METABOLIC PANEL
ALT: 9 U/L (ref 0–53)
AST: 9 U/L (ref 0–37)
Albumin: 2.6 g/dL — ABNORMAL LOW (ref 3.5–5.2)
Alkaline Phosphatase: 88 U/L (ref 39–117)
CO2: 26 mEq/L (ref 19–32)
Calcium: 9 mg/dL (ref 8.4–10.5)
Creatinine, Ser: 0.67 mg/dL (ref 0.50–1.35)
GFR calc non Af Amer: >90 mL/min (ref >90)
Potassium: 4.6 mEq/L (ref 3.5–5.1)
Sodium: 135 mEq/L (ref 135–145)
Total Protein: 8.3 g/dL (ref 6.0–8.3)

## 2013-08-19 LAB — CBC
MCV: 78.5 fL (ref 78.0–100.0)
Platelets: 374 10*3/uL (ref 150–400)
RBC: 4.27 MIL/uL (ref 4.22–5.81)
WBC: 10.4 10*3/uL (ref 4.0–10.5)

## 2013-08-19 LAB — PROTIME-INR: INR: 1.31 (ref 0.00–1.49)

## 2013-08-19 LAB — APTT: aPTT: 34 seconds (ref 24–37)

## 2013-08-19 SURGERY — AMPUTATION BELOW KNEE
Anesthesia: General | Site: Foot | Laterality: Left | Wound class: Clean

## 2013-08-19 MED ORDER — HYDROCODONE-ACETAMINOPHEN 10-325 MG PO TABS
1.0000 | ORAL_TABLET | ORAL | Status: DC | PRN
  Administered 2013-08-19 – 2013-08-23 (×8): 2 via ORAL
  Filled 2013-08-19 (×8): qty 2

## 2013-08-19 MED ORDER — PNEUMOCOCCAL VAC POLYVALENT 25 MCG/0.5ML IJ INJ
0.5000 mL | INJECTION | INTRAMUSCULAR | Status: AC
  Administered 2013-08-20: 0.5 mL via INTRAMUSCULAR
  Filled 2013-08-19: qty 0.5

## 2013-08-19 MED ORDER — PNEUMOCOCCAL VAC POLYVALENT 25 MCG/0.5ML IJ INJ: 0.5000 mL | INJECTION | INTRAMUSCULAR | Status: DC

## 2013-08-19 MED ORDER — MIDAZOLAM HCL 2 MG/2ML IJ SOLN
INTRAMUSCULAR | Status: AC
  Administered 2013-08-19: 2 mg
  Filled 2013-08-19: qty 2

## 2013-08-19 MED ORDER — HYDROMORPHONE HCL PF 1 MG/ML IJ SOLN
INTRAMUSCULAR | Status: AC
  Administered 2013-08-19: 0.5 mg via INTRAVENOUS
  Filled 2013-08-19: qty 2

## 2013-08-19 MED ORDER — HYDROMORPHONE HCL PF 1 MG/ML IJ SOLN
INTRAMUSCULAR | Status: AC
  Administered 2013-08-19: 0.5 mg via INTRAVENOUS
  Filled 2013-08-19: qty 1

## 2013-08-19 MED ORDER — INFLUENZA VAC SPLIT QUAD 0.5 ML IM SUSP: 0.5000 mL | INTRAMUSCULAR | Status: DC

## 2013-08-19 MED ORDER — 0.9 % SODIUM CHLORIDE (POUR BTL) OPTIME
TOPICAL | Status: DC | PRN
  Administered 2013-08-19: 1000 mL

## 2013-08-19 MED ORDER — OXYCODONE HCL 5 MG PO TABS
5.0000 mg | ORAL_TABLET | ORAL | Status: DC | PRN
  Administered 2013-08-19 – 2013-08-20 (×5): 10 mg via ORAL
  Filled 2013-08-19 (×4): qty 2

## 2013-08-19 MED ORDER — ASPIRIN EC 325 MG PO TBEC
325.0000 mg | DELAYED_RELEASE_TABLET | Freq: Every day | ORAL | Status: DC
  Administered 2013-08-19 – 2013-08-24 (×6): 325 mg via ORAL
  Filled 2013-08-19 (×6): qty 1

## 2013-08-19 MED ORDER — HYDROMORPHONE HCL PF 1 MG/ML IJ SOLN
0.2500 mg | INTRAMUSCULAR | Status: DC | PRN
  Administered 2013-08-19 (×6): 0.5 mg via INTRAVENOUS

## 2013-08-19 MED ORDER — METOCLOPRAMIDE HCL 5 MG/ML IJ SOLN: 5.0000 mg | Freq: Three times a day (TID) | INTRAMUSCULAR | Status: DC | PRN

## 2013-08-19 MED ORDER — CEFAZOLIN SODIUM-DEXTROSE 2-3 GM-% IV SOLR
2.0000 g | Freq: Four times a day (QID) | INTRAVENOUS | Status: AC
  Administered 2013-08-19 – 2013-08-20 (×3): 2 g via INTRAVENOUS
  Filled 2013-08-19 (×3): qty 50

## 2013-08-19 MED ORDER — METHOCARBAMOL 100 MG/ML IJ SOLN
500.0000 mg | Freq: Four times a day (QID) | INTRAVENOUS | Status: DC | PRN
  Filled 2013-08-19: qty 5

## 2013-08-19 MED ORDER — ONDANSETRON HCL 4 MG/2ML IJ SOLN: 4.0000 mg | Freq: Once | INTRAMUSCULAR | Status: DC | PRN

## 2013-08-19 MED ORDER — DEXMEDETOMIDINE HCL IN NACL 200 MCG/50ML IV SOLN
0.4000 ug/kg/h | Freq: Once | INTRAVENOUS | Status: AC
  Administered 2013-08-19: 80 ug/h via INTRAVENOUS
  Filled 2013-08-19: qty 50

## 2013-08-19 MED ORDER — FENTANYL CITRATE 0.05 MG/ML IJ SOLN
INTRAMUSCULAR | Status: DC | PRN
  Administered 2013-08-19 (×2): 100 ug via INTRAVENOUS
  Administered 2013-08-19: 50 ug via INTRAVENOUS
  Administered 2013-08-19: 100 ug via INTRAVENOUS
  Administered 2013-08-19: 50 ug via INTRAVENOUS
  Administered 2013-08-19: 100 ug via INTRAVENOUS

## 2013-08-19 MED ORDER — ATORVASTATIN CALCIUM 10 MG PO TABS
10.0000 mg | ORAL_TABLET | Freq: Every day | ORAL | Status: DC
  Administered 2013-08-19 – 2013-08-23 (×5): 10 mg via ORAL
  Filled 2013-08-19 (×7): qty 1

## 2013-08-19 MED ORDER — ONDANSETRON HCL 4 MG PO TABS: 4.0000 mg | ORAL_TABLET | Freq: Four times a day (QID) | ORAL | Status: DC | PRN

## 2013-08-19 MED ORDER — ONDANSETRON HCL 4 MG/2ML IJ SOLN
INTRAMUSCULAR | Status: DC | PRN
  Administered 2013-08-19: 4 mg via INTRAVENOUS

## 2013-08-19 MED ORDER — PROPOFOL 10 MG/ML IV BOLUS
INTRAVENOUS | Status: DC | PRN
  Administered 2013-08-19: 200 mg via INTRAVENOUS

## 2013-08-19 MED ORDER — LACTATED RINGERS IV SOLN
INTRAVENOUS | Status: DC
  Administered 2013-08-19: 08:00:00 via INTRAVENOUS

## 2013-08-19 MED ORDER — METHOCARBAMOL 500 MG PO TABS
500.0000 mg | ORAL_TABLET | Freq: Four times a day (QID) | ORAL | Status: DC | PRN
  Administered 2013-08-19 – 2013-08-20 (×4): 500 mg via ORAL
  Filled 2013-08-19 (×4): qty 1

## 2013-08-19 MED ORDER — LACTATED RINGERS IV SOLN
INTRAVENOUS | Status: DC | PRN
  Administered 2013-08-19 (×2): via INTRAVENOUS

## 2013-08-19 MED ORDER — DIPHENHYDRAMINE HCL 50 MG/ML IJ SOLN
INTRAMUSCULAR | Status: DC | PRN
  Administered 2013-08-19: 25 mg via INTRAVENOUS

## 2013-08-19 MED ORDER — SODIUM CHLORIDE 0.9 % IV SOLN
INTRAVENOUS | Status: DC
  Administered 2013-08-19: 20 mL/h via INTRAVENOUS

## 2013-08-19 MED ORDER — MIDAZOLAM HCL 5 MG/5ML IJ SOLN
INTRAMUSCULAR | Status: DC | PRN
  Administered 2013-08-19: 2 mg via INTRAVENOUS

## 2013-08-19 MED ORDER — INFLUENZA VAC SPLIT QUAD 0.5 ML IM SUSP
0.5000 mL | INTRAMUSCULAR | Status: AC
  Administered 2013-08-20: 0.5 mL via INTRAMUSCULAR
  Filled 2013-08-19: qty 0.5

## 2013-08-19 MED ORDER — METHOCARBAMOL 500 MG PO TABS
ORAL_TABLET | ORAL | Status: AC
  Administered 2013-08-19: 500 mg via ORAL
  Filled 2013-08-19: qty 1

## 2013-08-19 MED ORDER — ONDANSETRON HCL 4 MG/2ML IJ SOLN: 4.0000 mg | Freq: Four times a day (QID) | INTRAMUSCULAR | Status: DC | PRN

## 2013-08-19 MED ORDER — HYDROMORPHONE HCL PF 1 MG/ML IJ SOLN
0.5000 mg | INTRAMUSCULAR | Status: DC | PRN
  Administered 2013-08-19 – 2013-08-20 (×5): 1 mg via INTRAVENOUS
  Filled 2013-08-19 (×5): qty 1

## 2013-08-19 MED ORDER — METFORMIN HCL 500 MG PO TABS
500.0000 mg | ORAL_TABLET | Freq: Two times a day (BID) | ORAL | Status: DC
  Administered 2013-08-19 – 2013-08-24 (×10): 500 mg via ORAL
  Filled 2013-08-19 (×13): qty 1

## 2013-08-19 MED ORDER — OXYCODONE HCL 5 MG PO TABS
ORAL_TABLET | ORAL | Status: AC
  Administered 2013-08-19: 10 mg via ORAL
  Filled 2013-08-19: qty 2

## 2013-08-19 MED ORDER — INSULIN ASPART 100 UNIT/ML ~~LOC~~ SOLN
0.0000 [IU] | Freq: Three times a day (TID) | SUBCUTANEOUS | Status: DC
  Administered 2013-08-19 – 2013-08-21 (×5): 3 [IU] via SUBCUTANEOUS
  Administered 2013-08-21 (×2): 2 [IU] via SUBCUTANEOUS
  Administered 2013-08-22 – 2013-08-23 (×4): 3 [IU] via SUBCUTANEOUS
  Administered 2013-08-23: 2 [IU] via SUBCUTANEOUS
  Administered 2013-08-23 – 2013-08-24 (×2): 3 [IU] via SUBCUTANEOUS

## 2013-08-19 MED ORDER — INSULIN GLARGINE 100 UNIT/ML ~~LOC~~ SOLN
20.0000 [IU] | Freq: Every day | SUBCUTANEOUS | Status: DC
  Administered 2013-08-19 – 2013-08-23 (×5): 20 [IU] via SUBCUTANEOUS
  Filled 2013-08-19 (×6): qty 0.2

## 2013-08-19 MED ORDER — METOCLOPRAMIDE HCL 5 MG PO TABS: 5.0000 mg | ORAL_TABLET | Freq: Three times a day (TID) | ORAL | Status: DC | PRN

## 2013-08-19 MED ORDER — HYDROMORPHONE HCL PF 1 MG/ML IJ SOLN
INTRAMUSCULAR | Status: DC | PRN
  Administered 2013-08-19 (×2): 0.5 mg via INTRAVENOUS

## 2013-08-19 MED ORDER — LIDOCAINE HCL (CARDIAC) 20 MG/ML IV SOLN
INTRAVENOUS | Status: DC | PRN
  Administered 2013-08-19: 40 mg via INTRAVENOUS

## 2013-08-19 MED ORDER — INSULIN ASPART 100 UNIT/ML ~~LOC~~ SOLN
4.0000 [IU] | Freq: Three times a day (TID) | SUBCUTANEOUS | Status: DC
  Administered 2013-08-19 – 2013-08-24 (×14): 4 [IU] via SUBCUTANEOUS

## 2013-08-19 SURGICAL SUPPLY — 43 items
BANDAGE ESMARK 6X9 LF (GAUZE/BANDAGES/DRESSINGS) ×1 IMPLANT
BANDAGE GAUZE ELAST BULKY 4 IN (GAUZE/BANDAGES/DRESSINGS) ×4 IMPLANT
BLADE SAW RECIP 87.9 MT (BLADE) ×2 IMPLANT
BLADE SURG 21 STRL SS (BLADE) ×2 IMPLANT
BNDG COHESIVE 6X5 TAN STRL LF (GAUZE/BANDAGES/DRESSINGS) ×2 IMPLANT
BNDG ESMARK 6X9 LF (GAUZE/BANDAGES/DRESSINGS) ×2
CLOTH BEACON ORANGE TIMEOUT ST (SAFETY) ×2 IMPLANT
COVER SURGICAL LIGHT HANDLE (MISCELLANEOUS) ×2 IMPLANT
CUFF TOURNIQUET SINGLE 34IN LL (TOURNIQUET CUFF) IMPLANT
CUFF TOURNIQUET SINGLE 44IN (TOURNIQUET CUFF) IMPLANT
DRAIN PENROSE 1/2X12 LTX STRL (WOUND CARE) IMPLANT
DRAPE EXTREMITY T 121X128X90 (DRAPE) ×2 IMPLANT
DRAPE PROXIMA HALF (DRAPES) ×4 IMPLANT
DRAPE U-SHAPE 47X51 STRL (DRAPES) ×4 IMPLANT
DRSG ADAPTIC 3X8 NADH LF (GAUZE/BANDAGES/DRESSINGS) ×2 IMPLANT
DRSG PAD ABDOMINAL 8X10 ST (GAUZE/BANDAGES/DRESSINGS) ×4 IMPLANT
DURAPREP 26ML APPLICATOR (WOUND CARE) ×2 IMPLANT
ELECT REM PT RETURN 9FT ADLT (ELECTROSURGICAL) ×2
ELECTRODE REM PT RTRN 9FT ADLT (ELECTROSURGICAL) ×1 IMPLANT
GLOVE BIOGEL PI IND STRL 9 (GLOVE) ×1 IMPLANT
GLOVE BIOGEL PI INDICATOR 9 (GLOVE) ×1
GLOVE SURG ORTHO 9.0 STRL STRW (GLOVE) ×2 IMPLANT
GOWN PREVENTION PLUS XLARGE (GOWN DISPOSABLE) ×2 IMPLANT
GOWN SRG XL XLNG 56XLVL 4 (GOWN DISPOSABLE) ×1 IMPLANT
GOWN STRL NON-REIN XL XLG LVL4 (GOWN DISPOSABLE) ×1
KIT BASIN OR (CUSTOM PROCEDURE TRAY) ×2 IMPLANT
KIT ROOM TURNOVER OR (KITS) ×2 IMPLANT
MANIFOLD NEPTUNE II (INSTRUMENTS) ×2 IMPLANT
NS IRRIG 1000ML POUR BTL (IV SOLUTION) ×2 IMPLANT
PACK GENERAL/GYN (CUSTOM PROCEDURE TRAY) ×2 IMPLANT
PAD ARMBOARD 7.5X6 YLW CONV (MISCELLANEOUS) ×4 IMPLANT
SPONGE GAUZE 4X4 12PLY (GAUZE/BANDAGES/DRESSINGS) ×2 IMPLANT
SPONGE LAP 18X18 X RAY DECT (DISPOSABLE) IMPLANT
STAPLER VISISTAT 35W (STAPLE) IMPLANT
STOCKINETTE IMPERVIOUS LG (DRAPES) ×2 IMPLANT
SUT PDS AB 1 CT  36 (SUTURE)
SUT PDS AB 1 CT 36 (SUTURE) IMPLANT
SUT SILK 2 0 (SUTURE) ×1
SUT SILK 2-0 18XBRD TIE 12 (SUTURE) ×1 IMPLANT
TOWEL OR 17X24 6PK STRL BLUE (TOWEL DISPOSABLE) ×2 IMPLANT
TOWEL OR 17X26 10 PK STRL BLUE (TOWEL DISPOSABLE) ×2 IMPLANT
TUBE ANAEROBIC SPECIMEN COL (MISCELLANEOUS) IMPLANT
WATER STERILE IRR 1000ML POUR (IV SOLUTION) ×2 IMPLANT

## 2013-08-19 NOTE — Op Note (Signed)
OPERATIVE REPORT  DATE OF SURGERY: 08/19/2013  PATIENT:  Gabriel Howe,  52 y.o. male  PRE-OPERATIVE DIAGNOSIS:  Gangrene Left Foot  POST-OPERATIVE DIAGNOSIS:  Gangrene Left Foot  PROCEDURE:  Procedure(s): AMPUTATION BELOW KNEE- left  SURGEON:  Surgeon(s): Nadara Mustard, MD  ANESTHESIA:   general  EBL:  min ML  SPECIMEN:  Source of Specimen:  Left leg  TOURNIQUET:   Total Tourniquet Time Documented: area (laterality) - 13 minutes Total: area (laterality) - 13 minutes   PROCEDURE DETAILS: Patient is a 52 year old gentleman with diabetes status post right transtibial amputation who presents at this time after attempt at foot salvage surgery with a midfoot amputation the left who presents with wet gangrene abscess ulceration of the left foot. There is complete dehiscence of the wound. Patient does not have any foot salvage options and presents at this time for transtibial amputation. Risks and benefits were discussed including risk of wound healing. Patient states he understands and wished to proceed at this time. Description of procedure patient was brought to the operating room underwent general anesthetic. After adequate levels of anesthesia were obtained patient's left lower extremity was prepped using DuraPrep and draped into a sterile field and the foot was draped out of the sterile field with an impervious stockinette. A transverse incision was made 11 cm distal to the tibial tubercle this curved proximally and a large posterior flap was created. The tibia was transected 1 cm proximal to the skin incision was beveled anteriorly and the fibula was transected just proximal to the tibial incision. A knife was used to create a large posterior flap. The sciatic nerve was pulled cut and allowed to retract the vascular bundles were suture ligated with 2-0 silk. The tourniquet was deflated hemostasis was obtained. 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 This was removed. The deep and superficial fascial layers were closed using #1 PDS the skin was closed using staples and 2-0 nylon. The wound was covered with Adaptic orthopedic sponges AB dressing Kerlix and Coban. Patient was extubated taken to the PACU in stable condition. Patient wishes for discharge to home.  PLAN OF CARE: Admit to inpatient   PATIENT DISPOSITION:  PACU - hemodynamically stable.   Nadara Mustard, MD 08/19/2013 11:11 AM

## 2013-08-19 NOTE — OR Nursing (Signed)
Pt is screaming in pain after 2mg  IV dilaudid.  Dr. Noreene Larsson at bedside.  Versed 2mg  IV ordered and given with additional Dilaudid.  Pt. Was hurting again shortly after versed.  Dr. Noreene Larsson at bedside and administered 80 mcg IV of precedex and started drip at 0.40mcg/kg/hr.  Pt. Resting comfortably.

## 2013-08-19 NOTE — Anesthesia Postprocedure Evaluation (Signed)
  Anesthesia Post-op Note  Patient: Gabriel Howe  Procedure(s) Performed: Procedure(s) with comments: AMPUTATION BELOW KNEE- left (Left) - Left Below Knee Amputation  Patient Location: PACU  Anesthesia Type:General  Level of Consciousness: awake, alert , oriented and sedated  Airway and Oxygen Therapy: Patient Spontanous Breathing  Post-op Pain: mild  Post-op Assessment: Post-op Vital signs reviewed, Patient's Cardiovascular Status Stable, Respiratory Function Stable, Patent Airway and Pain level controlled  Post-op Vital Signs: stable  Complications: No apparent anesthesia complications

## 2013-08-19 NOTE — Anesthesia Preprocedure Evaluation (Addendum)
Anesthesia Evaluation  Patient identified by MRN, date of birth, ID band Patient awake    Reviewed: Allergy & Precautions, H&P , NPO status , Patient's Chart, lab work & pertinent test results  Airway Mallampati: II TM Distance: >3 FB Neck ROM: Full    Dental  (+) Teeth Intact and Dental Advisory Given   Pulmonary  breath sounds clear to auscultation        Cardiovascular Rhythm:Regular Rate:Normal     Neuro/Psych    GI/Hepatic   Endo/Other  diabetes, Poorly Controlled, Insulin Dependent and Oral Hypoglycemic Agents  Renal/GU      Musculoskeletal   Abdominal   Peds  Hematology   Anesthesia Other Findings   Reproductive/Obstetrics                          Anesthesia Physical Anesthesia Plan  ASA: III  Anesthesia Plan: General   Post-op Pain Management:    Induction: Intravenous  Airway Management Planned: LMA  Additional Equipment:   Intra-op Plan:   Post-operative Plan: Extubation in OR  Informed Consent: I have reviewed the patients History and Physical, chart, labs and discussed the procedure including the risks, benefits and alternatives for the proposed anesthesia with the patient or authorized representative who has indicated his/her understanding and acceptance.   Dental advisory given  Plan Discussed with: CRNA and Anesthesiologist  Anesthesia Plan Comments: (Gangrene L. Foot  Longstanding Type 2 DM 195 Obesity  Plan GA with LMA)        Anesthesia Quick Evaluation

## 2013-08-19 NOTE — Transfer of Care (Signed)
Immediate Anesthesia Transfer of Care Note  Patient: Gabriel Howe  Procedure(s) Performed: Procedure(s) with comments: AMPUTATION BELOW KNEE- left (Left) - Left Below Knee Amputation  Patient Location: PACU  Anesthesia Type:General  Level of Consciousness: awake  Airway & Oxygen Therapy: Patient Spontanous Breathing and Patient connected to face mask oxygen  Post-op Assessment: Report given to PACU RN and Post -op Vital signs reviewed and stable  Post vital signs: Reviewed and stable  Complications: No apparent anesthesia complications

## 2013-08-19 NOTE — Anesthesia Procedure Notes (Signed)
Procedure Name: LMA Insertion Date/Time: 08/19/2013 10:08 AM Performed by: DUDA, MARCUS V Pre-anesthesia Checklist: Patient identified, Emergency Drugs available, Suction available, Patient being monitored and Timeout performed Patient Re-evaluated:Patient Re-evaluated prior to inductionOxygen Delivery Method: Circle system utilized Preoxygenation: Pre-oxygenation with 100% oxygen Intubation Type: IV induction Ventilation: Mask ventilation without difficulty LMA: LMA inserted and LMA with gastric port inserted LMA Size: 5.0 Number of attempts: 1 Placement Confirmation: positive ETCO2 and breath sounds checked- equal and bilateral Tube secured with: Tape Dental Injury: Teeth and Oropharynx as per pre-operative assessment

## 2013-08-19 NOTE — Progress Notes (Signed)
Belongings bag and prosthetic leg were returned to patient..he has them at the bedside on 6n

## 2013-08-19 NOTE — H&P (Signed)
Gabriel Howe is an 52 y.o. male.   Chief Complaint: Gangrenous dehiscence left midfoot amputation. HPI: Patient is a 52 year old gentleman with diabetes who is status post foot salvage surgery. His last procedure was a midfoot amputation. Patient presents at this time with progressive gangrenous changes abscess cellulitis and drainage from the foot.  Past Medical History  Diagnosis Date  . Bronchitis     hx of;last time about 49yrs ago  . Neuromuscular disorder     diabetic neuropathy in feet  . Arthritis   . Hyperlipidemia     takes Atorvastatin daily  . Diabetes mellitus without complication     takes Metformin bid and Lantus nightly  . Heart murmur     Hx: of as a child    Past Surgical History  Procedure Laterality Date  . Toe amputation      at [facility], and removal of partial of second to 2012  . Tonsillectomy    . Amputation Right 01/02/2013    Procedure: AMPUTATION BELOW KNEE;  Surgeon: Nadara Mustard, MD;  Location: [facility] OR;  Service: Orthopedics;  Laterality: Right;  Right Below Knee Amputation  . Esophagogastroduodenoscopy    . Amputation Left 04/25/2013    Procedure: AMPUTATION RAY;  Surgeon: Nadara Mustard, MD;  Location: [facility] OR;  Service: Orthopedics;  Laterality: Left;  Left Foot 3rd Ray Amputation  . Amputation Left 08/03/2013    Procedure: Left Midfoot Amputation;  Surgeon: Nadara Mustard, MD;  Location: [facility] OR;  Service: Orthopedics;  Laterality: Left;  Left Foot Transmetatarsal Amputation    Family History  Problem Relation Age of Onset  . Other Mother   . Other Father    Social History:  reports that he has quit smoking. His smoking use included Cigarettes. He smoked 0.00 packs per day. He has never used smokeless tobacco. He reports that he uses illicit drugs. He reports that he does not drink alcohol.  Allergies:  Allergies  Allergen Reactions  . Bactroban [Mupirocin Calcium] Hives    Rash, swelling  . Shellfish Allergy Rash    Medications Prior to Admission   Medication Sig Dispense Refill  . atorvastatin (LIPITOR) 10 MG tablet Take 10 mg by mouth at bedtime.       Marland Kitchen doxycycline (VIBRAMYCIN) 100 MG capsule Take 100 mg by mouth 2 (two) times daily.      . insulin glargine (LANTUS) 100 UNIT/ML injection Inject 20 Units into the skin at bedtime.  10 mL  12  . metFORMIN (GLUCOPHAGE) 500 MG tablet Take 500 mg by mouth 2 (two) times daily.        No results found for this or any previous visit (from the past 48 hour(s)). No results found.  Review of Systems  All other systems reviewed and are negative.    Height 6\' 8"  (2.032 m), weight 158.759 kg (350 lb). Physical Exam  On examination patient has dehiscence abscess cellulitis drainage from the left midfoot amputation. Patient does have a palpable dorsalis pedis pulse. Assessment/Plan Assessment: Dehiscence cellulitis abscess left midfoot amputation.  Plan: Do to patient's progressive dehiscence and cellulitis and drainage patient will require a transtibial amputation. His problem seems to be a microcirculatory problem. He does have a palpable dorsalis pedis pulse and revascularization is not necessary. Will plan for a transtibial amputation on the left. Patient is also status post transtibial amputation on the right.  GabrielMARCUS Howe 08/19/2013, 7:04 AM

## 2013-08-20 ENCOUNTER — Encounter (HOSPITAL_COMMUNITY): Payer: Self-pay | Admitting: Orthopedic Surgery

## 2013-08-20 DIAGNOSIS — I739 Peripheral vascular disease, unspecified: Secondary | ICD-10-CM

## 2013-08-20 DIAGNOSIS — S88119A Complete traumatic amputation at level between knee and ankle, unspecified lower leg, initial encounter: Secondary | ICD-10-CM

## 2013-08-20 DIAGNOSIS — L98499 Non-pressure chronic ulcer of skin of other sites with unspecified severity: Secondary | ICD-10-CM

## 2013-08-20 LAB — GLUCOSE, CAPILLARY
Glucose-Capillary: 156 mg/dL — ABNORMAL HIGH (ref 70–99)
Glucose-Capillary: 151 mg/dL — ABNORMAL HIGH (ref 70–99)

## 2013-08-20 MED ORDER — GABAPENTIN 300 MG PO CAPS
300.0000 mg | ORAL_CAPSULE | Freq: Three times a day (TID) | ORAL | Status: DC
  Administered 2013-08-20 – 2013-08-24 (×13): 300 mg via ORAL
  Filled 2013-08-20 (×17): qty 1

## 2013-08-20 NOTE — Consult Note (Signed)
Physical Medicine and Rehabilitation Consult Reason for Consult: Left BKA Referring Physician: Dr. Lajoyce Corners   HPI: Gabriel Howe is a 52 y.o. 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 Admitted 08/19/2013 with nonhealing left foot with recent left third ray amputation 04/25/2013. 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 Postoperative pain management. Physical therapy evaluation completed 08/20/2013 with recommendations for physical medicine rehabilitation consult to consider inpatient rehabilitation services. Pt c/o pain not well relieved by oral meds Uses R BK prosthesis Review of Systems  Respiratory:       Exertional shortness of breath  Musculoskeletal: Positive for back pain.  Neurological: Positive for tingling, weakness and headaches.  All other systems reviewed and are negative.   Past Medical History  Diagnosis Date  . Bronchitis     hx of;last time about 65yrs ago  . Neuromuscular disorder     diabetic neuropathy in feet  . Hyperlipidemia     takes Atorvastatin daily  . Heart murmur     Hx: of as a child  . Type II diabetes mellitus     takes Metformin bid and Lantus nightly  . Exertional shortness of breath   . WJXBJYNW(295.6)     "monthly" (08/19/2013)  . Arthritis     "ankles and feet" (08/19/2013)  . Chronic back pain    Past Surgical History  Procedure Laterality Date  . Toe amputation Right 2012    at [facility], and removal of partial of second to 2012  . Tonsillectomy    . Amputation Right 01/02/2013    Procedure: AMPUTATION BELOW KNEE;  Surgeon: Nadara Mustard, MD;  Location: [facility] OR;  Service: Orthopedics;  Laterality: Right;  Right Below Knee Amputation  . Esophagogastroduodenoscopy    . Amputation Left 04/25/2013    Procedure: AMPUTATION RAY;  Surgeon: Nadara Mustard, MD;  Location: [facility] OR;  Service: Orthopedics;  Laterality: Left;  Left Foot 3rd Ray Amputation  . Amputation Left 08/03/2013    Procedure: Left Midfoot Amputation;  Surgeon: Nadara Mustard, MD;  Location: [facility] OR;  Service: Orthopedics;  Laterality: Left;  Left Foot Transmetatarsal Amputation  . Below knee leg amputation Left 08/19/2013   Family History  Problem Relation Age of Onset  . Other Mother   . Other Father    Social History:  reports that he has quit smoking. His smoking use included Cigarettes. He smoked 0.00 packs per day for 2 years. He has never used smokeless tobacco. He reports that he drinks alcohol. He reports that he uses illicit drugs. Allergies:  Allergies  Allergen Reactions  . Bactroban [Mupirocin Calcium] Hives    Rash, swelling  . Shellfish Allergy Rash   Medications Prior to Admission  Medication Sig Dispense Refill  . atorvastatin (LIPITOR) 10 MG tablet Take 10 mg by mouth at bedtime.       Marland Kitchen doxycycline (VIBRAMYCIN) 100 MG capsule Take 100 mg by mouth 2 (two) times daily.      . insulin glargine (LANTUS) 100 UNIT/ML injection Inject 20 Units into the skin at bedtime.  10 mL  12  . metFORMIN (GLUCOPHAGE) 500 MG tablet Take 500 mg by mouth 2 (two) times daily.        Home: Home Living Family/patient expects to be discharged to:: Inpatient rehab Living Arrangements: Alone  Functional History: Prior Function Comments: works  in [employer] mg[employer] Functional Status:  Mobility: Bed Mobility Bed Mobility: Supine to Sit;Sitting - Scoot to Edge of Bed Supine to Sit: 6: Modified independent (Device/Increase time);With rails Sitting - Scoot to Edge of Bed: 6: Modified independent (Device/Increase time) Transfers Transfers: Sit to Stand;Stand to [employer] Transfers Sit to Stand: 4: Min assist;With upper extremity assist;From bed;From elevated surface Stand to Sit: 4: Min assist;With upper extremity assist;To chair/3-in-1;With armrests;To elevated  surface Stand Pivot Transfers: 4: Min assist Ambulation/Gait Ambulation/Gait Assistance: Not tested (comment) Stairs: No Wheelchair Mobility Wheelchair Mobility: No  ADL:    Cognition: Cognition Overall Cognitive Status: Within Functional Limits for tasks assessed Orientation Level: Oriented X4 Cognition Arousal/Alertness: Awake/alert Behavior During Therapy: WFL for tasks assessed/performed Overall Cognitive Status: Within Functional Limits for tasks assessed  Blood pressure 139/86, pulse 90, temperature 97.8 F (36.6 C), temperature source Oral, resp. rate 19, height 6\' 8"  (2.032 m), weight 158.9 kg (350 lb 5 oz), SpO2 100.00%. Physical Exam  Vitals reviewed. Constitutional: He is oriented to person, place, and time.  HENT:  Head: Normocephalic.  Eyes: EOM are normal.  Neck: Normal range of motion. Neck supple. No thyromegaly present.  Cardiovascular: Normal rate and regular rhythm.   Respiratory: Effort normal and breath sounds normal. No respiratory distress.  GI: Soft. Bowel sounds are normal. He exhibits no distension.  Neurological: He is alert and oriented to person, place, and time.  Skin:  Left below-knee amputation site is dressed and appropriately tender. Right BKA site well healed  Motor 5/5 BUE adn R HF LHF 3/5, senation intact in hands Normal UE ROM  Results for orders placed during the hospital encounter of 08/19/13 (from the past 24 hour(s))  GLUCOSE, CAPILLARY     Status: Abnormal   Collection Time    08/19/13 11:20 AM      Result Value Range   Glucose-Capillary 185 (*) 70 - 99 mg/dL  GLUCOSE, CAPILLARY     Status: Abnormal   Collection Time    08/19/13  5:16 PM      Result Value Range   Glucose-Capillary 158 (*) 70 - 99 mg/dL  GLUCOSE, CAPILLARY     Status: Abnormal   Collection Time    08/19/13  7:23 PM      Result Value Range   Glucose-Capillary 142 (*) 70 - 99 mg/dL   Comment 1 Notify RN     Comment 2 Documented in Chart    GLUCOSE,  CAPILLARY     Status: Abnormal   Collection Time    08/19/13 10:29 PM      Result Value Range   Glucose-Capillary 150 (*) 70 - 99 mg/dL   Comment 1 Notify RN     Comment 2 Documented in Chart    GLUCOSE, CAPILLARY     Status: Abnormal   Collection Time    08/20/13  8:14 AM      Result Value Range   Glucose-Capillary 156 (*) 70 - 99 mg/dL   Comment 1 Notify RN     No results found.  Assessment/Plan: Diagnosis: L transtibial amputaion POD#2 1. Does the need for close, 24 hr/day medical supervision in concert with the patient's rehab needs make it unreasonable for this patient to be served in a less intensive setting? Potentially 2. Co-Morbidities requiring supervision/potential complications: Prior R Transtibial amp 3. Due to skin/wound care and pain management, does the patient require 24 hr/day rehab nursing? Potentially 4. Does the patient require coordinated care of a physician, rehab nurse, NA  to address physical and functional deficits in the context of the above medical diagnosis(es)? No Addressing deficits in the following areas: NA 5. Can the patient actively participate in an intensive therapy program of at least 3 hrs of therapy per day at least 5 days per week? Yes 6. The potential for patient to make measurable gains while on inpatient rehab is NA 7. Anticipated functional outcomes upon discharge from inpatient rehab are NA with PT, NA with OT, NA with SLP. 8. Estimated rehab length of stay to reach the above functional goals is: NA 9. Does the patient have adequate social supports to accommodate these discharge functional goals? Potentially 10. Anticipated D/C setting: Home 11. Anticipated post D/C treatments: HH therapy 12. Overall Rehab/Functional Prognosis: excellent  RECOMMENDATIONS: This patient's condition is appropriate for continued rehabilitative care in the following setting: [facility] Patient has agreed to participate in recommended program. Potentially Note that  insurance prior authorization may be required for reimbursement for recommended care.  Comment: Already at Min A level POD#2 anticipate rapid functional recovery if pain improves    08/20/2013

## 2013-08-20 NOTE — Progress Notes (Signed)
Rehab Admissions Coordinator Note:  Patient was screened by Trish Mage for appropriateness for an Inpatient Acute Rehab Consult.  Noted PT recommending CIR.  At this time, we are recommending Inpatient Rehab consult.  Trish Mage 08/20/2013, 10:13 AM  I can be reached at [phone].

## 2013-08-20 NOTE — Evaluation (Signed)
Physical Therapy Evaluation Patient Details Name: Gabriel Howe MRN: [id number] DOB: 08-05-1961 Today's Date: 08/20/2013 Time: 4098-1191 PT Time Calculation (min): 27 min  PT Assessment / Plan / Recommendation History of Present Illness  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  Clinical Impression  Pt very motivated to improve mobility and independence prior to returning to home.  Feel pt would make great candidate for CIR at D/C.  Will continue to follow.      PT Assessment  Patient needs continued PT services    Follow Up Recommendations  CIR    Does the patient have the potential to tolerate intense rehabilitation      Barriers to Discharge        Equipment Recommendations  None recommended by PT    Recommendations for Other Services Rehab consult   Frequency Min 3X/week    Precautions / Restrictions Precautions Precautions: Fall Precaution Comments: pt has R Prosthetic Leg Restrictions Weight Bearing Restrictions: No   Pertinent Vitals/Pain Some phantom pain in L BKA.        Mobility  Bed Mobility Bed Mobility: Supine to Sit;Sitting - Scoot to Edge of Bed Supine to Sit: 6: Modified independent (Device/Increase time);With rails Sitting - Scoot to Edge of Bed: 6: Modified independent (Device/Increase time) Details for Bed Mobility Assistance: Demos good use of rails.   Transfers Transfers: Sit to Stand;Stand to Sit;Stand Pivot Transfers Sit to Stand: 4: Min assist;With upper extremity assist;From bed;From elevated surface Stand to Sit: 4: Min assist;With upper extremity assist;To chair/3-in-1;With armrests;To elevated surface Stand Pivot Transfers: 4: Min assist Details for Transfer Assistance: Elevated height of bed, pt independently donned R prosthetic leg.  Sit to stand using bed rails and A to bring hips up from bed and for balance.  pt nervous during pivot to chair and noted sneaker on R prosthetic sticks to floor making it hard to pivot.  pt  states he has a Conservation officer, historic buildings style shoe that he can use on his R prosthetic that will make it easier for him to pivot.   Ambulation/Gait Ambulation/Gait Assistance: Not tested (comment) Stairs: No Wheelchair Mobility Wheelchair Mobility: No    Exercises     PT Diagnosis: Difficulty walking  PT Problem List: Decreased strength;Decreased activity tolerance;Decreased balance;Decreased mobility;Decreased knowledge of use of DME;Obesity PT Treatment Interventions: DME instruction;Stair training;Gait training;Functional mobility training;Therapeutic activities;Therapeutic exercise;Balance training;Patient/family education     PT Goals(Current goals can be found in the care plan section) Acute Rehab PT Goals Patient Stated Goal: home asap PT Goal Formulation: With patient Time For Goal Achievement: 09/03/13 Potential to Achieve Goals: Good  Visit Information  Last PT Received On: 08/20/13 Assistance Needed: +2 (for safety) History of Present Illness: 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       Prior Functioning  Home Living Family/patient expects to be discharged to:: Inpatient rehab Prior Function Level of Independence: Independent 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   Cervical / Trunk Assessment Cervical / Trunk Assessment: Normal  Balance Balance Balance Assessed: Yes Dynamic Sitting Balance Dynamic  Sitting - Balance Support: No upper extremity supported;Feet unsupported;During functional activity Dynamic Sitting - Level of Assistance: 5: Stand by assistance Dynamic Sitting - Comments: pt dons R prosthetic sitting EOB without A.    End of Session PT - End of Session Equipment Utilized During Treatment: Gait belt Activity Tolerance: Patient tolerated treatment well Patient left: in chair;with call bell/phone within reach Nurse Communication: Mobility status  GP     Sunny Schlein, [place] [phone] 08/20/2013, 9:28 AM

## 2013-08-20 NOTE — Evaluation (Signed)
Occupational Therapy Evaluation Patient Details Name: Gabriel Howe MRN: [id number] DOB: 1960-12-21 Today's Date: 08/20/2013 Time: 4098-1191 OT Time Calculation (min): 15 min  OT Assessment / Plan / Recommendation History of present illness 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   Clinical Impression   This 52 yo male s/p above presents to acute OT doing well with lateral scoots, but due to his height,size, and pain (post surgical and phantom) not doing as well with getting up on his RLE (prothestic). Would benefit from inpatient rehab stay to get to an Mod I level (standing and W/C).    OT Assessment  Patient needs continued OT Services    Follow Up Recommendations  CIR       Equipment Recommendations  None recommended by OT       Frequency  Min 2X/week    Precautions / Restrictions Precautions Precautions: Fall Precaution Comments: pt has R Prosthetic Leg Restrictions Weight Bearing Restrictions: Yes LLE Weight Bearing: Non weight bearing   Pertinent Vitals/Pain 8/10 LLE (re-positioned; already pre-medicated)    ADL  Eating/Feeding: Independent Where Assessed - Eating/Feeding: Edge of bed Grooming: Set up Where Assessed - Grooming: Unsupported sitting Upper Body Bathing: Set up Where Assessed - Upper Body Bathing: Unsupported sitting Lower Body Bathing: Set up Where Assessed - Lower Body Bathing: Supine, head of bed up;Supine, head of bed flat;Rolling right and/or left;Lean right and/or left;Unsupported sitting Upper Body Dressing: Set up Where Assessed - Upper Body Dressing: Unsupported sitting Lower Body Dressing: Set up Where Assessed - Lower Body Dressing: Supine, head of bed up;Supine, head of bed flat;Rolling right and/or left;Lean right and/or left;Unsupported sitting Toilet Transfer: Min Pension scheme manager Method:  (scoot from built up recliner to bed at same level, going to left) Equipment Used:  (RLE prothestic) Transfers/Ambulation  Related to ADLs: min guard A (see toliet transfer above)    OT Diagnosis: Generalized weakness;Acute pain  OT Problem List: Decreased activity tolerance;Impaired balance (sitting and/or standing);Pain;Obesity OT Treatment Interventions: Self-care/ADL training;Balance training;Patient/family education;DME and/or AE instruction   OT Goals(Current goals can be found in the care plan section) Acute Rehab OT Goals Patient Stated Goal: home as soon as he feels more comfortable and steady up on his RLE (prothestic) OT Goal Formulation: With patient Time For Goal Achievement: 08/27/13 Potential to Achieve Goals: Good  Visit Information  Last OT Received On: 08/20/13 Assistance Needed: +2 (to attempt stand pivots or ambulation) History of Present Illness: 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       Prior Functioning     Home Living Family/patient expects to be discharged to:: Inpatient rehab Living Arrangements: Alone Available Help at Discharge: Family;Available 24 hours/day Type of Home: House Home Access: Stairs to enter [employer] of Steps: 2 Entrance Stairs-Rails: None Home Layout: One level Home Equipment: Crutches;Wheelchair - [employer] - 2 wheels;Tub bench Additional Comments: pt's mother is coming to stay with him as long as he needs when he first goes home then he will go home with her  Prior Function Level of Independence: Independent with assistive device(s) 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 Dominant Hand: Right         Vision/Perception Vision - History Patient Visual Report: No change from baseline   Cognition  Cognition Arousal/Alertness: Awake/alert Behavior During Therapy: WFL for tasks assessed/performed Overall Cognitive Status: Within Functional Limits for tasks assessed    Extremity/Trunk Assessment Upper Extremity Assessment Upper Extremity  Assessment: Overall WFL for tasks  assessed     Mobility Bed Mobility Bed Mobility: Sit to Supine Sit to Supine: 6: Modified independent (Device/Increase time);With rail Transfers Transfers: Not assessed           End of Session OT - End of Session Activity Tolerance: Patient tolerated treatment well Patient left: in bed;with call bell/phone within reach       [name] [phone] 08/20/2013, 1:18 PM

## 2013-08-20 NOTE — Progress Notes (Signed)
Patient ID: Gabriel Howe, male   DOB: 01-26-1961, 52 y.o.   MRN: [id number] C/o increased pain, neurontin ordered, 300mg  TID, dressing dry

## 2013-08-20 NOTE — Progress Notes (Signed)
Nutrition Brief Note  Patient identified on the Malnutrition Screening Tool (MST) Report  Wt Readings from Last 15 Encounters:  08/19/13 350 lb 5 oz (158.9 kg)  08/19/13 350 lb 5 oz (158.9 kg)  08/04/13 380 lb 4.7 oz (172.5 kg)  08/04/13 380 lb 4.7 oz (172.5 kg)  04/25/13 340 lb (154.223 kg)  04/25/13 340 lb (154.223 kg)  12/18/12 381 lb 6.3 oz (173 kg)    Body mass index is 38.48 kg/(m^2). Patient meets criteria for obese, class 3 based on current BMI.   Current diet order is CHO Mod Medium, patient is consuming approximately 100% of meals at this time. Labs and medications reviewed.   Pt states that he weighed ~450 lbs prior to his R BKA this year.  Pt states that he did lose some wt acutely, however has lost most of his 100 lbs over the past year through conscious effort due to the weight limit of his prosthetic. Pt states he is eating well, getting adequate protein, and does not have any nutrition concerns. No nutrition interventions warranted at this time. If nutrition issues arise, please consult RD.   Loyce Dys, MS RD LDN Clinical Inpatient Dietitian Pager: [phone] Weekend/After hours pager: [phone]

## 2013-08-21 LAB — GLUCOSE, CAPILLARY
Glucose-Capillary: 145 mg/dL — ABNORMAL HIGH (ref 70–99)
Glucose-Capillary: 177 mg/dL — ABNORMAL HIGH (ref 70–99)

## 2013-08-21 MED ORDER — HYDROCODONE-ACETAMINOPHEN 5-325 MG PO TABS: 1.0000 | ORAL_TABLET | Freq: Four times a day (QID) | ORAL | Status: DC | PRN

## 2013-08-21 NOTE — Progress Notes (Signed)
Patient ID: Gabriel Howe, male   DOB: 1961-01-18, 52 y.o.   MRN: [id number] Patient made good progress with therapy yesterday. Anticipate  he could discharge to home this weekend. Prescriptions are on the chart.

## 2013-08-21 NOTE — Progress Notes (Signed)
Physical Therapy Treatment Patient Details Name: Gabriel Howe MRN: [id number] DOB: May 30, 1961 Today's Date: 08/21/2013 Time: 1191-4782 PT Time Calculation (min): 31 min  PT Assessment / Plan / Recommendation  History of Present Illness 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   PT Comments   Pt continuing to need A with set-up and problem solving through transfer with only R prosthetic LE to A.  Pt works hard and needs use of multiple grab bars in bathroom to safely transfer to toilet.  Pt continue to need MinA overall for OOB mobility and cueing for safety and technique with only one prosthetic leg.  Noted rehab MD has initially declined pt for CIR admit.  Still feel pt would be great candidate for CIR stay to maximize independence prior to pt returning home with his mother who is only able to provide S.  Pt also has stairs to enter his home and at this point pt's only option is to crawl as he is unable to ascend stairs to enter home.  If pt D/C to home he will need HHPT and OT to continue working with pt.  Will continue to follow.    Follow Up Recommendations  Home health PT;Supervision for mobility/OOB     Does the patient have the potential to tolerate intense rehabilitation     Barriers to Discharge        Equipment Recommendations  None recommended by PT    Recommendations for Other Services    Frequency Min 3X/week   Progress towards PT Goals Progress towards PT goals: Progressing toward goals  Plan Discharge plan needs to be updated    Precautions / Restrictions Precautions Precautions: Fall Precaution Comments: pt has R Prosthetic Leg Restrictions Weight Bearing Restrictions: Yes LLE Weight Bearing: Non weight bearing   Pertinent Vitals/Pain Pt indicating pain in residual limb today.  RN arrived to medicate.      Mobility  Bed Mobility Bed Mobility: Supine to Sit;Sitting - Scoot to Edge of Bed Supine to Sit: 6: Modified independent (Device/Increase  time);With rails Sitting - Scoot to Edge of Bed: 6: Modified independent (Device/Increase time);With rail Details for Bed Mobility Assistance: [employer] good use of rails.   Transfers Transfers: Lateral/Scoot Electrical engineer Transfers: 4: Min assist;With upper extremity assistance Lateral/Scoot Transfers: 4: Min assist;With armrests removed Details for Transfer Assistance: pt performed lateral scoot from bed to W/C with armrests removed.  pt needed A to set-up W/C and for steadying A for pt and W/C.  Squat pivot performed on/off toilet with use of grab bars and armrests removed from W/C.  Also squat pivot from W/C to recliner.  A with set-up and steadying pt and W/C facilitation provided to complete pivot to recliner.   Ambulation/Gait Ambulation/Gait Assistance: Not tested (comment) Stairs: No Wheelchair Mobility Wheelchair Mobility: No    Exercises     PT Diagnosis:    PT Problem List:   PT Treatment Interventions:     PT Goals (current goals can now be found in the care plan section) Acute Rehab PT Goals Patient Stated Goal: home asap Time For Goal Achievement: 09/03/13 Potential to Achieve Goals: Good  Visit Information  Last PT Received On: 08/21/13 Assistance Needed: +2 (safety) History of Present Illness: 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    Subjective Data  Patient Stated Goal: home asap   Cognition  Cognition Arousal/Alertness: Awake/alert Behavior During Therapy: WFL for tasks assessed/performed Overall  Cognitive Status: Within Functional Limits for tasks assessed    Balance  Balance Balance Assessed: No  End of Session PT - End of Session Equipment Utilized During Treatment: Gait belt Activity Tolerance: Patient tolerated treatment well Patient left: in chair;with call bell/phone within reach Nurse Communication: Mobility status   GP     Sunny Schlein, [place] [phone] 08/21/2013, 10:07 AM

## 2013-08-21 NOTE — Care Management Note (Signed)
  Page 1 of 1   08/21/2013     11:46:47 AM   CARE MANAGEMENT NOTE 08/21/2013  Patient:  Gabriel Howe, Gabriel Howe   Account Number:  [id number]  Date Initiated:  08/21/2013  Documentation initiated by:  Ronny Flurry  Subjective/Objective Assessment:     Action/Plan:   Anticipated DC Date:  08/22/2013   Anticipated DC Plan:  HOME/SELF CARE         Choice offered to / List presented to:             Status of service:   Medicare Important Message given?   (If response is "NO", the following Medicare IM given date fields will be blank) Date Medicare IM given:   Date Additional Medicare IM given:    Discharge Disposition:    Per UR Regulation:    If discussed at Long Length of Stay Meetings, dates discussed:    Comments:  08-21-13 PT recommending HHPT / OT . Discussed with patient . Patient states he has had home health in the past and does not feel like he needs home health at present . Patient feels comfortable going home with no home health and states he has no equipment needs . Bedside nurse aware. Ronny Flurry RN BSN [phone]

## 2013-08-21 NOTE — Progress Notes (Signed)
Discussed with P.T. We will follow pt's progress with therapy to see if he progresses to a level to d/c home over the next several days. [phone]

## 2013-08-22 LAB — GLUCOSE, CAPILLARY: Glucose-Capillary: 164 mg/dL — ABNORMAL HIGH (ref 70–99)

## 2013-08-22 NOTE — Progress Notes (Signed)
   Subjective:  Patient reports pain as mild.    Objective:   VITALS:   Filed Vitals:   08/21/13 0507 08/21/13 1413 08/21/13 2105 08/22/13 0523  BP: 138/82 120/85 119/82 141/79  Pulse: 94 100 87 90  Temp: 98.7 F (37.1 C) 97.9 F (36.6 C) 98.3 F (36.8 C) 98.3 F (36.8 C)  TempSrc: Oral Oral Oral Oral  Resp: 16 17 18 16   Height:      Weight:      SpO2: 95% 98% 96% 97%    Neurologically intact Neurovascular intact Sensation intact distally Intact pulses distally Incision: dressing C/D/I and no drainage No cellulitis present Compartment soft   Lab Results  Component Value Date   WBC 10.4 08/19/2013   HGB 11.1* 08/19/2013   HCT 33.5* 08/19/2013   MCV 78.5 08/19/2013   PLT 374 08/19/2013     Assessment/Plan: 3 Days Post-Op   Problem List Items Addressed This Visit   None      Up with therapy Pain control Discharge planning - plan to dc tomorrow vs. Monday [facility]B LLE   Cheral Almas 08/22/2013, 1:06 PM [phone]

## 2013-08-22 NOTE — Progress Notes (Signed)
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  Follow Up Recommendations  Home health OT (pt will be at his home in [place] until Nov3, then in [place] with parents)       Equipment Recommendations  None recommended by OT       Frequency Min 2X/week   Progress towards OT Goals Progress towards OT goals: Progressing toward goals  Plan Discharge plan needs to be updated    Precautions / Restrictions Precautions Precautions: Fall Precaution Comments: pt has R Prosthetic Leg Restrictions Weight Bearing Restrictions: Yes LLE Weight Bearing: Non weight bearing       ADL  Toilet Transfer: Min guard Toilet Transfer Method: Stand pivot Acupuncturist:  (Bed>W/C next to bed) Equipment Used: Rolling walker;Gait belt (RLE prothestic) Transfers/Ambulation Related to ADLs: min guard A  for transfers      OT Goals(current goals can now be found in the care plan section)    Visit Information  Last OT Received On: 08/22/13 Assistance Needed: +1 History of Present Illness: 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          Cognition  Cognition Arousal/Alertness: Awake/alert Behavior During Therapy: WFL for tasks assessed/performed Overall Cognitive Status: Within Functional Limits for tasks assessed    Mobility  Bed Mobility Bed Mobility: Supine to Sit;Sitting - Scoot to Edge of Bed Supine to Sit: 7: Independent;HOB flat Sitting - Scoot to Edge of Bed: 7: Independent Transfers Transfers: Sit to Stand;Stand to Sit Sit to Stand: 4: Min guard;With upper extremity assist;From elevated surface;From bed Stand to Sit: 4: Min guard;With upper  extremity assist;With armrests (to W/C) Details for Transfer Assistance: stand pivot/hop from bed to W/C next to bed    Exercises  Other Exercises Other Exercises: Worked on sit<>stand from bed from varying heights as well as once in standing squat push ups with RW (pt did 40 reps in 10 rep intervals)      End of Session OT - End of Session Equipment Utilized During Treatment: Gait belt;Rolling walker Activity Tolerance: Patient tolerated treatment well Patient left:  (in W/C washing up in bathroom at sink)       [name] [phone] 08/22/2013, 8:31 AM

## 2013-08-22 NOTE — Progress Notes (Signed)
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  Will continue to follow.    Follow Up Recommendations  Home health PT;Supervision for mobility/OOB     Does the patient have the potential to tolerate intense rehabilitation     Barriers to Discharge        Equipment Recommendations  None recommended by PT    Recommendations for Other Services    Frequency Min 3X/week   Progress towards PT Goals Progress towards PT goals: Progressing toward goals  Plan Current plan remains appropriate    Precautions / Restrictions Precautions Precautions: Fall Precaution Comments: pt has R Prosthetic Leg Restrictions Weight Bearing Restrictions: Yes LLE Weight Bearing: Non weight bearing   Pertinent Vitals/Pain Indicates pain is "not bad" today.      Mobility  Bed Mobility Bed Mobility: Supine to Sit;Sitting - Scoot to Edge of Bed Supine to Sit: 6: Modified independent (Device/Increase time);With rails Sitting - Scoot to Edge of Bed: 6: Modified independent (Device/Increase time);With rail Details for Bed Mobility Assistance: Demos good use of rails.   Transfers Transfers: Lateral/Scoot Programmer, systems Transfers Sit to Stand: 4: Min guard;With upper extremity assist;From elevated surface;From bed Stand to Sit: 4: Min guard;With upper extremity assist;With armrests (to W/C) Squat Pivot Transfers: 4: Min guard;With upper extremity assistance Lateral/Scoot Transfers: 4: Min guard;With armrests removed Details for Transfer Assistance: pt performed lateral scoot from bed to W/C with armrests removed.  pt needed A to set-up W/C and for steadying A  for pt and W/C.  Squat pivot performed on/off toilet with use of grab bars and armrests removed from W/C.  Also squat pivot from W/C to recliner.  A with set-up and steadying pt and W/C facilitation provided to complete pivot to recliner.   Ambulation/Gait Ambulation/Gait Assistance: Not tested (comment) Stairs: No Wheelchair Mobility Wheelchair Mobility: No    Exercises Other Exercises Other Exercises: Worked on sit<>stand from bed from varying heights as well as once in standing squat push ups with RW (pt did 40 reps in 10 rep intervals)   PT Diagnosis:    PT Problem List:   PT Treatment Interventions:     PT Goals (current goals can now be found in the care plan section) Acute Rehab PT Goals Patient Stated Goal: home asap PT Goal Formulation: With patient Time For Goal Achievement: 09/03/13 Potential to Achieve Goals: Good  Visit Information  Last PT Received On: 08/22/13 Assistance Needed: +1 History of Present Illness: 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

## 2013-08-23 LAB — GLUCOSE, CAPILLARY
Glucose-Capillary: 162 mg/dL — ABNORMAL HIGH (ref 70–99)
Glucose-Capillary: 153 mg/dL — ABNORMAL HIGH (ref 70–99)
Glucose-Capillary: 143 mg/dL — ABNORMAL HIGH (ref 70–99)
Glucose-Capillary: 159 mg/dL — ABNORMAL HIGH (ref 70–99)

## 2013-08-23 NOTE — Progress Notes (Signed)
PT Cancellation Note  Patient Details Name: Gabriel Howe MRN: [id number] DOB: September 14, 1961   Cancelled Treatment:    Reason Eval/Treat Not Completed: Patient declined, no reason specified  Patient reports he had been up in his chair, bathed, and propelled himself around department in w/c.  Declined PT today.   Vena Austria 08/23/2013, 1:36 PM Durenda Hurt. Renaldo Fiddler, [facility] Acute Rehab Services Pager [phone]

## 2013-08-23 NOTE — Progress Notes (Signed)
   Subjective:  Patient reports pain as mild.    Objective:   VITALS:   Filed Vitals:   08/22/13 0523 08/22/13 1359 08/22/13 2117 08/23/13 0457  BP: 141/79 145/79 129/72 121/80  Pulse: 90 86 87 86  Temp: 98.3 F (36.8 C) 98.6 F (37 C) 98.1 F (36.7 C) 97.5 F (36.4 C)  TempSrc: Oral Oral Oral Oral  Resp: 16 18 17 18   Height:      Weight:      SpO2: 97% 100% 97% 98%    Neurologically intact Neurovascular intact Sensation intact distally Intact pulses distally Incision: dressing C/D/I and no drainage No cellulitis present Compartment soft   Lab Results  Component Value Date   WBC 10.4 08/19/2013   HGB 11.1* 08/19/2013   HCT 33.5* 08/19/2013   MCV 78.5 08/19/2013   PLT 374 08/19/2013     Assessment/Plan: 4 Days Post-Op   Problem List Items Addressed This Visit   None      Up with therapy Pain control Discharge planning - plan to dc mon NWB LLE   Cheral Almas 08/23/2013, 10:21 AM [phone]

## 2013-08-24 LAB — GLUCOSE, CAPILLARY: Glucose-Capillary: 181 mg/dL — ABNORMAL HIGH (ref 70–99)

## 2013-08-24 NOTE — Progress Notes (Signed)
Discharge patient home. Home discgharge instruction given, no question verbalized.

## 2013-08-24 NOTE — Discharge Summary (Signed)
Physician Discharge Summary  Patient ID: Gabriel Howe MRN: [id number] DOB/AGE: Mar 25, 1961 52 y.o.  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 *   Discharged Condition: stable  Hospital Course: Patient's hospital course was essentially unremarkable. He underwent a left transtibial amputation. Postoperatively patient was discharged to home in stable condition.  Consults: None  Significant Diagnostic Studies: labs: Routine labs  Treatments: surgery: See operative note  Discharge Exam: Blood pressure 125/81, pulse 82, temperature 97.6 F (36.4 C), temperature source Oral, resp. rate 20, height 6\' 8"  (2.032 m), weight 158.9 kg (350 lb 5 oz), SpO2 97.00%. Incision/Wound: dressing clean dry and intact  Disposition: 01-Home or Self Care  Discharge Orders   Future Orders Complete By Expires   Call MD / Call 911  As directed    Comments:     If you experience chest pain or shortness of breath, CALL 911 and be transported to the hospital emergency room.  If you develope a fever above 101 F, pus (white drainage) or increased drainage or redness at the wound, or calf pain, call your surgeon's office.   Constipation Prevention  As directed    Comments:     Drink plenty of fluids.  Prune juice may be helpful.  You may use a stool softener, such as Colace (over the counter) 100 mg twice a day.  Use MiraLax (over the counter) for constipation as needed.   Diet - low sodium heart healthy  As directed    Increase activity slowly as tolerated  As directed        Medication List    STOP taking these medications       doxycycline 100 MG capsule  Commonly known as:  VIBRAMYCIN      TAKE these medications       atorvastatin 10 MG tablet  Commonly known as:   LIPITOR  Take 10 mg by mouth at bedtime.     HYDROcodone-acetaminophen 5-325 MG per tablet  Commonly known as:  NORCO  Take 1 tablet by mouth every 6 (six) hours as needed for pain.     insulin glargine 100 UNIT/ML injection  Commonly known as:  LANTUS  Inject 20 Units into the skin at bedtime.     metFORMIN 500 MG tablet  Commonly known as:  GLUCOPHAGE  Take 500 mg by mouth 2 (two) times daily.           Follow-up Information   Follow up with Saharra Santo V, MD In 1 week.   Specialty:  Orthopedic Surgery   Contact information:   [address] [place] [address] [phone]       Signed: Nadara Mustard 08/24/2013, 7:10 AM

## 2013-08-24 NOTE — Progress Notes (Signed)
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 Wanting to work on transfers and attempt some hopping on his prothesis this morning prior to DC home. Patient able to laterally transfer to [name] without difficulty. Continue to recommend HHPT for further ambulation needs.   Follow Up Recommendations  Home health PT;Supervision for mobility/OOB     Does the patient have the potential to tolerate intense rehabilitation     Barriers to Discharge        Equipment Recommendations  None recommended by PT    Recommendations for Other Services    Frequency Min 3X/week   Progress towards PT Goals Progress towards PT goals: Progressing toward goals  Plan Current plan remains appropriate    Precautions / Restrictions Precautions Precautions: Fall Precaution Comments: pt has R Prosthetic Leg Restrictions LLE Weight Bearing: Non weight bearing   Pertinent Vitals/Pain no apparent distress    Mobility  Bed Mobility Supine to Sit: 6: Modified independent (Device/Increase time);With rails Transfers Sit to Stand: 4: Min guard;With upper extremity assist;From elevated surface;From bed Stand to Sit: 4: Min guard;With upper extremity assist;With armrests Stand Pivot Transfers: 4: Min assist Details for Transfer Assistance: Patient SPT with min A to ensure balance. Stood with good technique.  Ambulation/Gait Ambulation/Gait Assistance: 4: Min assist Ambulation Distance (Feet): 4 Feet Assistive device: Rolling walker Ambulation/Gait Assistance Details: 2 ft forward then backwards. 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

## 2013-09-25 IMAGING — CR DG FOOT COMPLETE 3+V*R*
1 series · 4 of 4 positions shown · non-contrast
Comparison: Toe radiographs 09/05/2012.

CLINICAL DATA: Diabetic with nonhealing foot wound.

RIGHT FOOT COMPLETE - 3+ VIEW

[Series 1: AP · right · 4 of 4 slices shown]
[im 1/4]
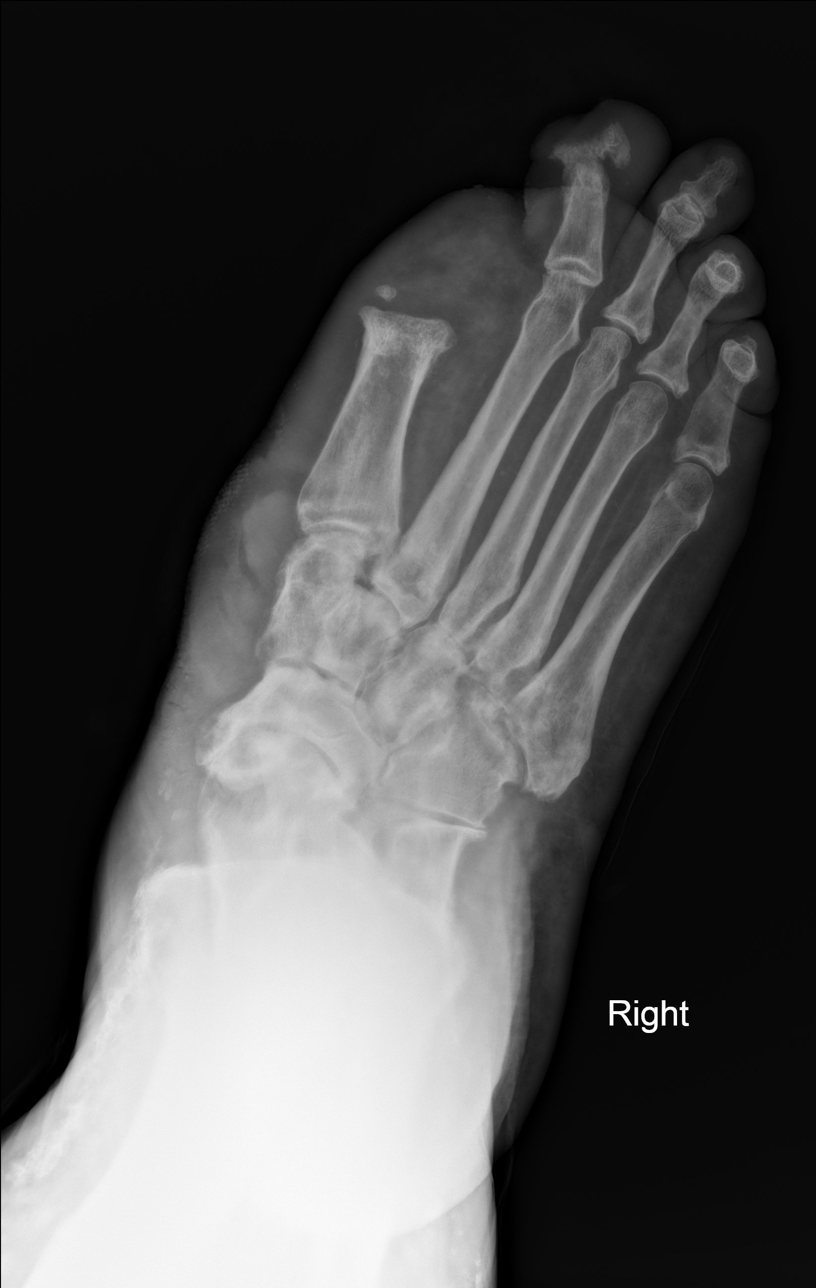
[im 2/4]
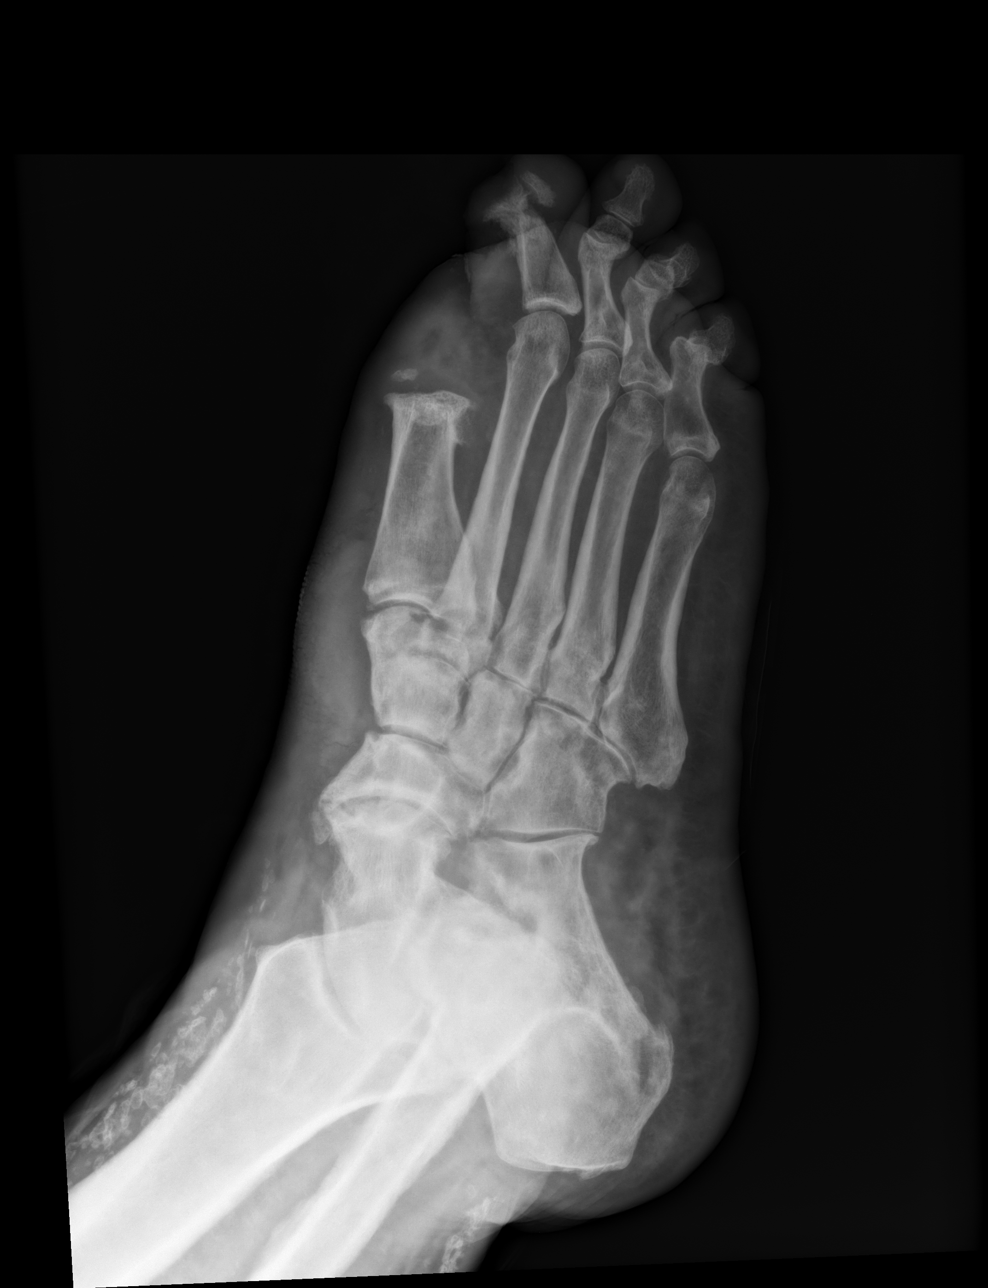
[im 3/4]
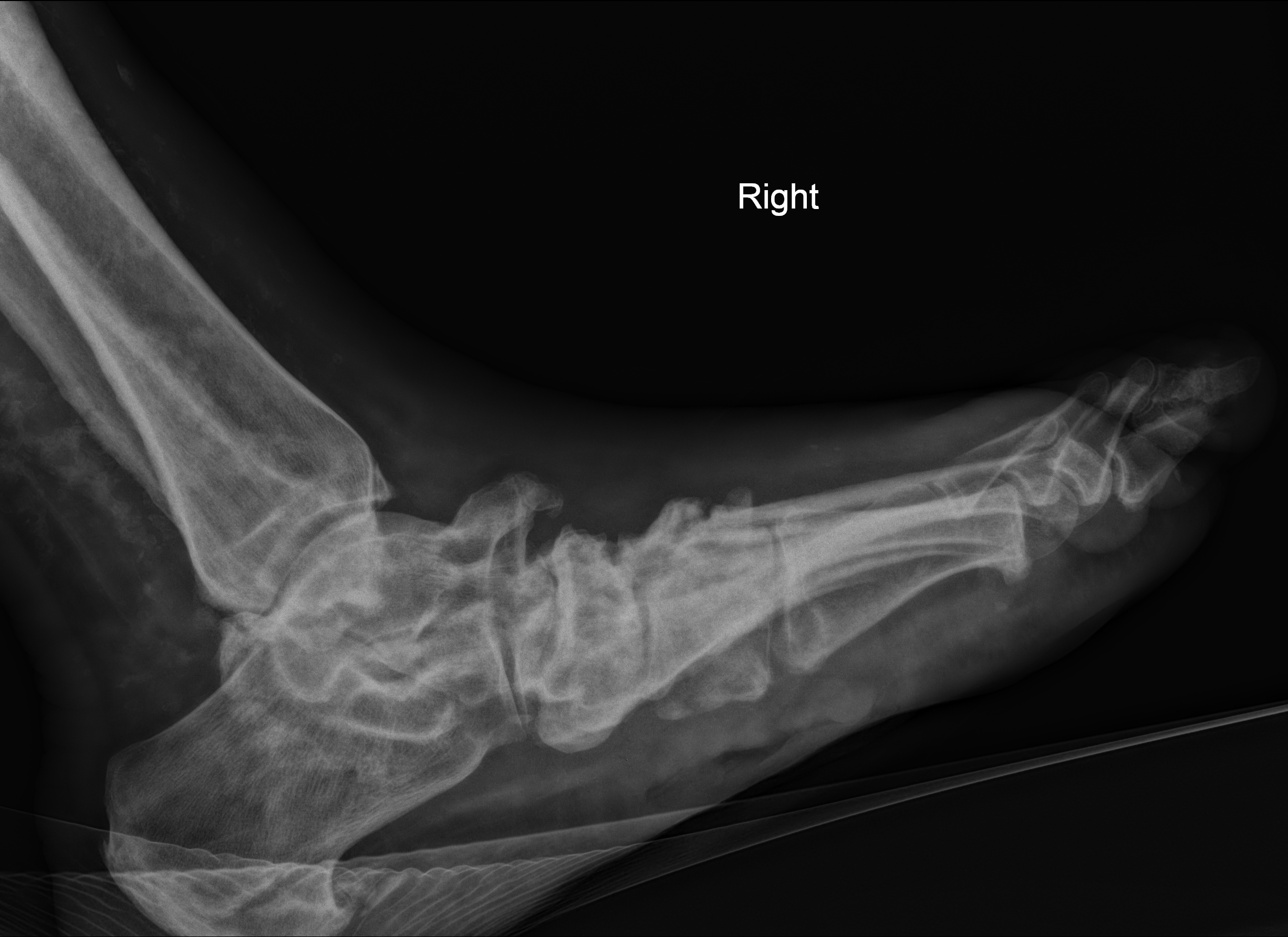
[im 4/4]
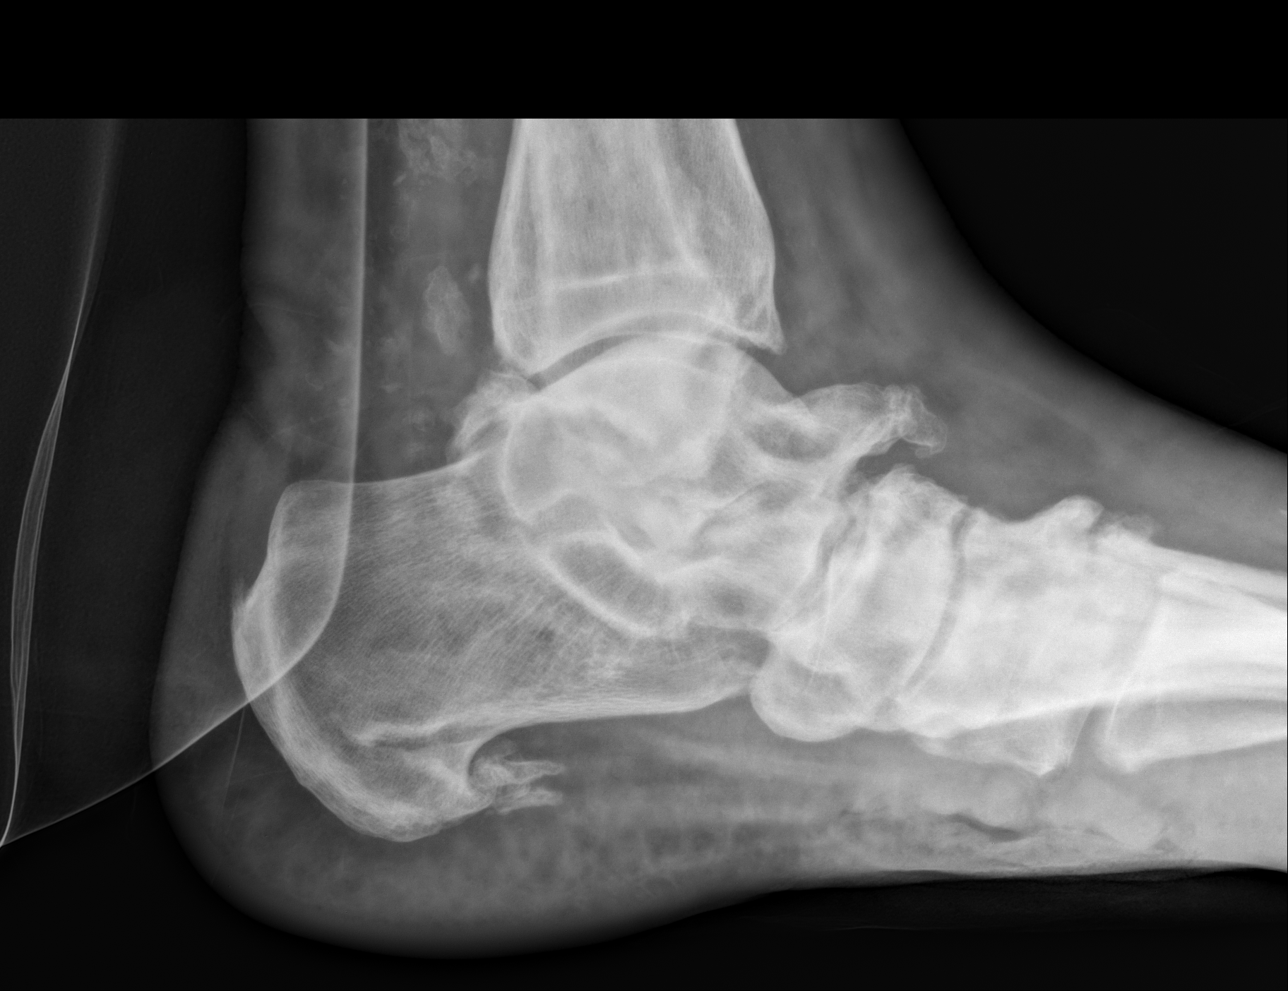

[4 of 4 positions shown; findings below may reference images not displayed]

FINDINGS: There is mild motion artifact.  Postsurgical changes are
stable status post amputation through the first metatarsal head and
through the second middle phalanx.  There is increased deformity of
the remaining second middle phalanx without gross bone destruction.
Extensive neuroarthropathic changes are noted throughout the
midfoot with osteophytes and mild subluxation. There is some soft
tissue emphysema within the plantar aspect of the forefoot, best
seen on the lateral view.  There may be additional soft tissue
emphysema distal to the remaining first phalanx.  Extensive soft
tissue calcifications are noted within the lower leg.
IMPRESSION: 1.  No radiographic evidence of recurrent osteomyelitis.
2.  Extensive neuro arthropathic changes.
3.  Suspected soft tissue emphysema within the plantar forefoot and
adjacent to the first metatarsal.  This may represent soft tissue
infection.

## 2014-06-14 ENCOUNTER — Emergency Department (HOSPITAL_BASED_OUTPATIENT_CLINIC_OR_DEPARTMENT_OTHER): Payer: Auto Insurance (includes no fault)

## 2014-06-14 ENCOUNTER — Emergency Department (HOSPITAL_BASED_OUTPATIENT_CLINIC_OR_DEPARTMENT_OTHER)
Admission: EM | Admit: 2014-06-14 | Discharge: 2014-06-14 | Disposition: A | Discharge status: Home / Self Care | Payer: Auto Insurance (includes no fault) | Attending: Emergency Medicine | Admitting: Emergency Medicine

## 2014-06-14 ENCOUNTER — Encounter (HOSPITAL_BASED_OUTPATIENT_CLINIC_OR_DEPARTMENT_OTHER): Payer: Self-pay

## 2014-06-14 DIAGNOSIS — S139XXA Sprain of joints and ligaments of unspecified parts of neck, initial encounter: Secondary | ICD-10-CM | POA: Insufficient documentation

## 2014-06-14 DIAGNOSIS — Y92410 Unspecified street and highway as the place of occurrence of the external cause: Secondary | ICD-10-CM | POA: Insufficient documentation

## 2014-06-14 DIAGNOSIS — S40019A Contusion of unspecified shoulder, initial encounter: Secondary | ICD-10-CM | POA: Insufficient documentation

## 2014-06-14 DIAGNOSIS — R52 Pain, unspecified: Secondary | ICD-10-CM | POA: Insufficient documentation

## 2014-06-14 DIAGNOSIS — M545 Low back pain, unspecified: Secondary | ICD-10-CM | POA: Insufficient documentation

## 2014-06-14 DIAGNOSIS — G8929 Other chronic pain: Secondary | ICD-10-CM | POA: Insufficient documentation

## 2014-06-14 DIAGNOSIS — S239XXA Sprain of unspecified parts of thorax, initial encounter: Secondary | ICD-10-CM | POA: Insufficient documentation

## 2014-06-14 HISTORY — DX: Type 2 diabetes mellitus without complications (CMS HCC): E11.9

## 2014-06-14 HISTORY — DX: Presence of spectacles and contact lenses: Z97.3

## 2014-06-14 HISTORY — DX: Unspecified contact dermatitis, unspecified cause: L25.9

## 2014-06-14 HISTORY — DX: Arthropathy, unspecified: M12.9

## 2014-06-14 MED ORDER — TRAMADOL 50 MG TABLET
50.00 mg | ORAL_TABLET | Freq: Four times a day (QID) | ORAL | Status: AC | PRN
Start: 2014-06-14 — End: ?

## 2014-06-14 MED ORDER — KETOROLAC 30 MG/ML (1 ML) INJECTION SOLUTION
30.00 mg | INTRAMUSCULAR | Status: AC
Start: 2014-06-14 — End: 2014-06-14
  Administered 2014-06-14: 30 mg via INTRAMUSCULAR
  Filled 2014-06-14: qty 1
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
## 2014-06-14 NOTE — ED Nurses Note (Signed)
Patient discharged home with family.  AVS reviewed with patient.  A written copy of the AVS and discharge instructions was given to the patient.  Questions sufficiently answered as needed.  Patient/care giver encouraged to follow up with PCP as indicated.  In the event of an emergency, patient instructed to call 911 or go to the nearest emergency room.     Handed patient prescription for Ultram. Ambulatory from ED without difficulty.
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
## 2014-06-14 NOTE — ED Nurses Note (Signed)
[facility]. Radiology staff state patient became diaphoretic over in xray. They placed him in a wheelchair to come back and gave him a cool wash cloth. Patient states he feels ok; "it was just hot over there".

## 2015-05-15 ENCOUNTER — Inpatient Hospital Stay (HOSPITAL_COMMUNITY): Payer: 59

## 2015-05-15 ENCOUNTER — Inpatient Hospital Stay (HOSPITAL_COMMUNITY)
Admission: AD | Admit: 2015-05-15 | Discharge: 2015-05-20 | DRG: 475 | Disposition: A | Discharge status: Home / Self Care | Payer: 59 | Source: Other Acute Inpatient Hospital | Attending: Internal Medicine | Admitting: Internal Medicine

## 2015-05-15 DIAGNOSIS — Z89512 Acquired absence of left leg below knee: Secondary | ICD-10-CM

## 2015-05-15 DIAGNOSIS — I10 Essential (primary) hypertension: Secondary | ICD-10-CM | POA: Diagnosis present

## 2015-05-15 DIAGNOSIS — E119 Type 2 diabetes mellitus without complications: Secondary | ICD-10-CM

## 2015-05-15 DIAGNOSIS — Z89511 Acquired absence of right leg below knee: Secondary | ICD-10-CM | POA: Diagnosis not present

## 2015-05-15 DIAGNOSIS — Y835 Amputation of limb(s) as the cause of abnormal reaction of the patient, or of later complication, without mention of misadventure at the time of the procedure: Secondary | ICD-10-CM | POA: Diagnosis present

## 2015-05-15 DIAGNOSIS — E1165 Type 2 diabetes mellitus with hyperglycemia: Secondary | ICD-10-CM | POA: Diagnosis present

## 2015-05-15 DIAGNOSIS — M549 Dorsalgia, unspecified: Secondary | ICD-10-CM | POA: Diagnosis present

## 2015-05-15 DIAGNOSIS — T8740 Infection of amputation stump, unspecified extremity: Secondary | ICD-10-CM | POA: Diagnosis present

## 2015-05-15 DIAGNOSIS — E1140 Type 2 diabetes mellitus with diabetic neuropathy, unspecified: Secondary | ICD-10-CM | POA: Diagnosis present

## 2015-05-15 DIAGNOSIS — T8149XA Infection following a procedure, other surgical site, initial encounter: Secondary | ICD-10-CM | POA: Diagnosis present

## 2015-05-15 DIAGNOSIS — Z86718 Personal history of other venous thrombosis and embolism: Secondary | ICD-10-CM | POA: Diagnosis not present

## 2015-05-15 DIAGNOSIS — E785 Hyperlipidemia, unspecified: Secondary | ICD-10-CM | POA: Diagnosis present

## 2015-05-15 DIAGNOSIS — D72829 Elevated white blood cell count, unspecified: Secondary | ICD-10-CM | POA: Diagnosis present

## 2015-05-15 DIAGNOSIS — E1169 Type 2 diabetes mellitus with other specified complication: Secondary | ICD-10-CM | POA: Diagnosis not present

## 2015-05-15 DIAGNOSIS — T8743 Infection of amputation stump, right lower extremity: Principal | ICD-10-CM | POA: Diagnosis present

## 2015-05-15 DIAGNOSIS — G8929 Other chronic pain: Secondary | ICD-10-CM | POA: Diagnosis present

## 2015-05-15 DIAGNOSIS — Z87891 Personal history of nicotine dependence: Secondary | ICD-10-CM | POA: Diagnosis not present

## 2015-05-15 DIAGNOSIS — T814XXA Infection following a procedure, initial encounter: Secondary | ICD-10-CM | POA: Diagnosis present

## 2015-05-15 DIAGNOSIS — D62 Acute posthemorrhagic anemia: Secondary | ICD-10-CM | POA: Diagnosis not present

## 2015-05-15 DIAGNOSIS — T148XXA Other injury of unspecified body region, initial encounter: Secondary | ICD-10-CM

## 2015-05-15 DIAGNOSIS — E11621 Type 2 diabetes mellitus with foot ulcer: Secondary | ICD-10-CM | POA: Diagnosis not present

## 2015-05-15 DIAGNOSIS — Z794 Long term (current) use of insulin: Secondary | ICD-10-CM | POA: Diagnosis not present

## 2015-05-15 DIAGNOSIS — M869 Osteomyelitis, unspecified: Secondary | ICD-10-CM

## 2015-05-15 DIAGNOSIS — Z7901 Long term (current) use of anticoagulants: Secondary | ICD-10-CM | POA: Diagnosis not present

## 2015-05-15 DIAGNOSIS — L089 Local infection of the skin and subcutaneous tissue, unspecified: Secondary | ICD-10-CM | POA: Diagnosis present

## 2015-05-15 DIAGNOSIS — T8131XA Disruption of external operation (surgical) wound, not elsewhere classified, initial encounter: Secondary | ICD-10-CM | POA: Diagnosis present

## 2015-05-15 LAB — CBC
HEMATOCRIT: 34 % — AB (ref 39.0–52.0)
HEMOGLOBIN: 11 g/dL — AB (ref 13.0–17.0)
MCH: 26.8 pg (ref 26.0–34.0)
MCHC: 32.4 g/dL (ref 30.0–36.0)
MCV: 82.9 fL (ref 78.0–100.0)
Platelets: 318 10*3/uL (ref 150–400)
RBC: 4.1 MIL/uL — AB (ref 4.22–5.81)
RDW: 13.9 % (ref 11.5–15.5)
WBC: 10.6 10*3/uL — ABNORMAL HIGH (ref 4.0–10.5)

## 2015-05-15 LAB — BASIC METABOLIC PANEL
Anion gap: 12 (ref 5–15)
BUN: 14 mg/dL (ref 6–20)
CALCIUM: 8.1 mg/dL — AB (ref 8.9–10.3)
CO2: 24 mmol/L (ref 22–32)
Chloride: 99 mmol/L — ABNORMAL LOW (ref 101–111)
Creatinine, Ser: 1.04 mg/dL (ref 0.61–1.24)
GFR calc Af Amer: >60 mL/min (ref >60)
Glucose, Bld: 229 mg/dL — ABNORMAL HIGH (ref 65–99)
Potassium: 3.4 mmol/L — ABNORMAL LOW (ref 3.5–5.1)
SODIUM: 135 mmol/L (ref 135–145)

## 2015-05-15 LAB — GLUCOSE, CAPILLARY
GLUCOSE-CAPILLARY: 195 mg/dL — AB (ref 65–99)
GLUCOSE-CAPILLARY: 240 mg/dL — AB (ref 65–99)
Glucose-Capillary: 202 mg/dL — ABNORMAL HIGH (ref 65–99)
Glucose-Capillary: 207 mg/dL — ABNORMAL HIGH (ref 65–99)
Glucose-Capillary: 212 mg/dL — ABNORMAL HIGH (ref 65–99)
Glucose-Capillary: 224 mg/dL — ABNORMAL HIGH (ref 65–99)

## 2015-05-15 MED ORDER — VANCOMYCIN HCL 10 G IV SOLR
2500.0000 mg | Freq: Once | INTRAVENOUS | Status: AC
  Administered 2015-05-15: 2500 mg via INTRAVENOUS
  Filled 2015-05-15: qty 2500

## 2015-05-15 MED ORDER — INSULIN GLARGINE 100 UNIT/ML ~~LOC~~ SOLN
10.0000 [IU] | Freq: Every day | SUBCUTANEOUS | Status: DC
  Filled 2015-05-15: qty 0.1

## 2015-05-15 MED ORDER — ATORVASTATIN CALCIUM 10 MG PO TABS
10.0000 mg | ORAL_TABLET | Freq: Every day | ORAL | Status: DC
  Administered 2015-05-15 – 2015-05-19 (×5): 10 mg via ORAL
  Filled 2015-05-15 (×5): qty 1

## 2015-05-15 MED ORDER — INSULIN GLARGINE 100 UNIT/ML ~~LOC~~ SOLN
12.0000 [IU] | Freq: Every day | SUBCUTANEOUS | Status: DC
Start: 2015-05-15 — End: 2015-05-17
  Administered 2015-05-15 – 2015-05-16 (×2): 12 [IU] via SUBCUTANEOUS
  Filled 2015-05-15 (×2): qty 0.12

## 2015-05-15 MED ORDER — SODIUM CHLORIDE 0.9 % IV SOLN
INTRAVENOUS | Status: DC
  Administered 2015-05-15 – 2015-05-18 (×2): via INTRAVENOUS

## 2015-05-15 MED ORDER — VANCOMYCIN HCL 10 G IV SOLR
1500.0000 mg | Freq: Two times a day (BID) | INTRAVENOUS | Status: DC
  Administered 2015-05-15 – 2015-05-17 (×4): 1500 mg via INTRAVENOUS
  Filled 2015-05-15 (×4): qty 1500

## 2015-05-15 MED ORDER — MORPHINE SULFATE 2 MG/ML IJ SOLN
2.0000 mg | INTRAMUSCULAR | Status: DC | PRN
  Administered 2015-05-16: 2 mg via INTRAVENOUS

## 2015-05-15 MED ORDER — INSULIN ASPART 100 UNIT/ML ~~LOC~~ SOLN
0.0000 [IU] | SUBCUTANEOUS | Status: DC
  Administered 2015-05-15 (×3): 3 [IU] via SUBCUTANEOUS
  Administered 2015-05-15: 2 [IU] via SUBCUTANEOUS
  Administered 2015-05-15 – 2015-05-16 (×3): 3 [IU] via SUBCUTANEOUS
  Administered 2015-05-16 (×2): 5 [IU] via SUBCUTANEOUS
  Administered 2015-05-16: 2 [IU] via SUBCUTANEOUS
  Administered 2015-05-16 – 2015-05-17 (×4): 3 [IU] via SUBCUTANEOUS
  Administered 2015-05-17 (×2): 5 [IU] via SUBCUTANEOUS
  Administered 2015-05-17: 3 [IU] via SUBCUTANEOUS
  Administered 2015-05-18 (×3): 2 [IU] via SUBCUTANEOUS
  Administered 2015-05-18 (×3): 3 [IU] via SUBCUTANEOUS
  Administered 2015-05-19: 1 [IU] via SUBCUTANEOUS
  Administered 2015-05-19: 2 [IU] via SUBCUTANEOUS
  Administered 2015-05-19: 3 [IU] via SUBCUTANEOUS
  Administered 2015-05-19: 2 [IU] via SUBCUTANEOUS
  Administered 2015-05-19 – 2015-05-20 (×5): 1 [IU] via SUBCUTANEOUS

## 2015-05-15 MED ORDER — PIPERACILLIN-TAZOBACTAM 3.375 G IVPB
3.3750 g | Freq: Three times a day (TID) | INTRAVENOUS | Status: DC
  Administered 2015-05-15 – 2015-05-20 (×15): 3.375 g via INTRAVENOUS
  Filled 2015-05-15 (×18): qty 50

## 2015-05-15 MED ORDER — PIPERACILLIN-TAZOBACTAM 3.375 G IVPB 30 MIN
3.3750 g | Freq: Once | INTRAVENOUS | Status: AC
  Administered 2015-05-15: 3.375 g via INTRAVENOUS
  Filled 2015-05-15: qty 50

## 2015-05-15 MED ORDER — POTASSIUM CHLORIDE CRYS ER 20 MEQ PO TBCR
40.0000 meq | EXTENDED_RELEASE_TABLET | ORAL | Status: AC
  Administered 2015-05-15 (×2): 40 meq via ORAL
  Filled 2015-05-15 (×2): qty 2

## 2015-05-15 NOTE — Progress Notes (Signed)
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

## 2015-05-15 NOTE — H&P (Signed)
[facility] History and Physical  Gabriel Howe ZHY:[id number] DOB: [birth year] DOA: 05/15/2015  Referring physician: EDP PCP: Ardath SaxPARKER, [name], MD   Chief Complaint: Infection of amputation stump   HPI: Gabriel Howe is a 54 y.o. male h/o DM2, who is s/p amputation of bilateral lower extremities with bilateral BKAs.  His R BKA was performed by Dr. Lajoyce Cornersuda in 2014.  He had been doing fine for over 2 years with this until patient developed infection in his R leg stump last Friday.  He had been put on doxycycline for this.  Patient was at home this evening when his R leg "burst open" and began leaking "Blood".  He presented to the ED at outside facility for these complaints who transferred him here for orthopaedic consultation.  Review of Systems: Systems reviewed.  As above, otherwise negative  Past Medical History  Diagnosis Date  . Bronchitis     hx of;last time about 7674yrs ago  . Neuromuscular disorder     diabetic neuropathy in feet  . Hyperlipidemia     takes Atorvastatin daily  . Heart murmur     Hx: of as a child  . Type II diabetes mellitus     takes Metformin bid and Lantus nightly  . Exertional shortness of breath   . EXBMWUXL(244.0Headache(784.0)     "monthly" (08/19/2013)  . Arthritis     "ankles and feet" (08/19/2013)  . Chronic back pain    Past Surgical History  Procedure Laterality Date  . Toe amputation Right 2012    at [facility], and removal of partial of second to 2012  . Tonsillectomy    . Amputation Right 01/02/2013    Procedure: AMPUTATION BELOW KNEE;  Surgeon: Nadara MustardMarcus V Duda, MD;  Location: [facility] OR;  Service: Orthopedics;  Laterality: Right;  Right Below Knee Amputation  . Esophagogastroduodenoscopy    . Amputation Left 04/25/2013    Procedure: AMPUTATION RAY;  Surgeon: Nadara MustardMarcus V Duda, MD;  [facility];  Service: Orthopedics;  Laterality: Left;  Left Foot 3rd Ray Amputation  . Amputation Left 08/03/2013    Procedure: Left Midfoot Amputation;  Surgeon: Nadara MustardMarcus V Duda, MD;   [facility];  Service: Orthopedics;  Laterality: Left;  Left Foot Transmetatarsal Amputation  . Below knee leg amputation Left 08/19/2013  . Amputation Left 08/19/2013    Procedure: AMPUTATION BELOW KNEE- left;  Surgeon: Nadara MustardMarcus V Duda, MD;  Location: [facility] OR;  Service: Orthopedics;  Laterality: Left;  Left Below Knee Amputation   Social History:  reports that he has quit smoking. His smoking use included Cigarettes. He quit after 2 years of use. He has never used smokeless tobacco. He reports that he drinks alcohol. He reports that he uses illicit drugs.  Allergies  Allergen Reactions  . Bactroban [Mupirocin Calcium] Hives    Rash, swelling  . Shellfish Allergy Rash    Family History  Problem Relation Age of Onset  . Other Mother   . Other Father      Prior to Admission medications   Medication Sig Start Date End Date Taking? Authorizing Provider  atorvastatin (LIPITOR) 10 MG tablet Take 10 mg by mouth at bedtime.     Historical Provider, MD  HYDROcodone-acetaminophen (NORCO) 5-325 MG per tablet Take 1 tablet by mouth every 6 (six) hours as needed for pain. 08/21/13   Nadara MustardMarcus Duda V, MD  insulin glargine (LANTUS) 100 UNIT/ML injection Inject 20 Units into the skin at bedtime. 12/21/12   Alba CoryBelkys A Regalado, MD  metFORMIN (  GLUCOPHAGE) 500 MG tablet Take 500 mg by mouth 2 (two) times daily.    Historical Provider, MD   Physical Exam: Filed Vitals:   05/15/15 0200  BP: 140/80  Pulse: 80  Temp: 98.5 F (36.9 C)  Resp: 18    BP 140/80 mmHg  Pulse 80  Temp(Src) 98.5 F (36.9 C) (Oral)  Resp 18  SpO2 99%  General Appearance:    Alert, oriented, no distress, appears stated age  Head:    Normocephalic, atraumatic  Eyes:    PERRL, EOMI, sclera non-icteric        Nose:   Nares without drainage or epistaxis. Mucosa, turbinates normal  Throat:   Moist mucous membranes. Oropharynx without erythema or exudate.  Neck:   Supple. No carotid bruits.  No thyromegaly.  No lymphadenopathy.    Back:     No CVA tenderness, no spinal tenderness  Lungs:     Clear to auscultation bilaterally, without wheezes, rhonchi or rales  Chest wall:    No tenderness to palpitation  Heart:    Regular rate and rhythm without murmurs, gallops, rubs  Abdomen:     Soft, non-tender, nondistended, normal bowel sounds, no organomegaly  Genitalia:    deferred  Rectal:    deferred  Extremities:   RLE BKA stump is under dressing with some serosanguinous drainage soaking through the dressing.  Pulses:   2+ and symmetric all extremities  Skin:   Skin color, texture, turgor normal, no rashes or lesions  Lymph nodes:   Cervical, supraclavicular, and axillary nodes normal  Neurologic:   CNII-XII intact. Normal strength, sensation and reflexes      throughout    Labs on Admission:  Basic Metabolic Panel: No results for input(s): NA, K, CL, CO2, GLUCOSE, BUN, CREATININE, CALCIUM, MG, PHOS in the last 168 hours. Liver Function Tests: No results for input(s): AST, ALT, ALKPHOS, BILITOT, PROT, ALBUMIN in the last 168 hours. No results for input(s): LIPASE, AMYLASE in the last 168 hours. No results for input(s): AMMONIA in the last 168 hours. CBC: No results for input(s): WBC, NEUTROABS, HGB, HCT, MCV, PLT in the last 168 hours. Cardiac Enzymes: No results for input(s): CKTOTAL, CKMB, CKMBINDEX, TROPONINI in the last 168 hours.  BNP (last 3 results) No results for input(s): PROBNP in the last 8760 hours. CBG:  Recent Labs Lab 05/15/15 0207  GLUCAP 202*    Radiological Exams on Admission: No results found.  EKG: Independently reviewed.  Assessment/Plan Principal Problem:   Infection of amputation stump of right lower extremity Active Problems:   DM2 (diabetes mellitus, type 2)   Surgical wound dehiscence   Surgical wound infection   S/P bilateral BKA (below knee amputation)   Infection of below knee amputation stump   1. Infection of amputation stump of RLE - 1. Empiric zosyn and  vanc 2. Blood and wound cultures 3. NPO for now until surgeon can assess 4. No specific wound care orders for now until surgeon can assess. 5. Informed Dr. August Saucer of patients arrival, plan for zosyn + vanc, plan to keep NPO, and plan to order no specific wound care until he can assess / say otherwise, etc 6. IVF 7. Repeat CBC to trend leukocytosis (was 12k) in AM 8. Morphine PRN pain 2. DM2 - 1. Half home lantus dose (10 units QHS) 2. Sensitive scale SSI q4h while NPO 3. DVT ppx - due to possible surgery and bleeding, no heparin ordered, cant order SCDs due to BLE amputations  Code Status: Full Code  Family Communication: No family in room Disposition Plan: Admit to inpatient   Time spent: 70 min  Lavel Rieman M. [facility] Pager [phone]  If 7AM-7PM, please contact the day team taking care of the patient [email or web address] Password [facility] 05/15/2015, 2:43 AM

## 2015-05-15 NOTE — Progress Notes (Signed)
Utilization Review Completed.Gabriel Howe T7/17/2016  

## 2015-05-15 NOTE — Progress Notes (Signed)
Patient seen and examined. Admitted after midnight secondary to open/infected right stump. Patient follow with Dr. Lajoyce Cornersuda and is status post bilateral BKA since 2014. approx 2 weeks ago has increase redness, swelling and pain on right stump, with subsequently busted antero-lateral wound with significant drainage (sero-purulent according to patient). No fever, no chills. Slight skin maceration appreciated and inability to wear prosthesis at this moment. Please referred to H&P written by Dr. Julian ReilGardner for further info/details on admission.   Plan: -will keep NPO for now and allow Orthopedic surgery to assess and decide on needed intervention (I&D vs revision, etc...) -continue broad spectrum IV antibiotics -CBG's q4 hours while NPO and use of lantus (lower dose than home regimen) and SSI -PRN analgesics.   Gabriel LollMadera, Gabriel Howe [phone]

## 2015-05-15 NOTE — Progress Notes (Signed)
ANTIBIOTIC CONSULT NOTE - INITIAL  Pharmacy Consult for Vancocin and Zosyn Indication: wound infection  Allergies  Allergen Reactions  . Bactroban [Mupirocin Calcium] Hives    Rash, swelling  . Shellfish Allergy Rash    Patient Measurements: Height: 6\' 4"  (193 cm) Weight: (!) 335 lb 1.6 oz (152 kg) IBW/kg (Calculated) : 86.8  Vital Signs: Temp: 98.5 F (36.9 C) (07/17 0200) Temp Source: Oral (07/17 0200) BP: 140/80 mmHg (07/17 0200) Pulse Rate: 80 (07/17 0200)   Medical History: Past Medical History  Diagnosis Date  . Bronchitis     hx of;last time about 2423yrs ago  . Neuromuscular disorder     diabetic neuropathy in feet  . Hyperlipidemia     takes Atorvastatin daily  . Heart murmur     Hx: of as a child  . Type II diabetes mellitus     takes Metformin bid and Lantus nightly  . Exertional shortness of breath   . WUJWJXBJ(478.2Headache(784.0)     "monthly" (08/19/2013)  . Arthritis     "ankles and feet" (08/19/2013)  . Chronic back pain     Medications:  Scheduled:  . atorvastatin  10 mg Oral QHS  . insulin aspart  0-9 Units Subcutaneous 6 times per day  . insulin glargine  10 Units Subcutaneous QHS    Assessment: 54yo male had LE amputation 4335yr ago, has recently taken doxycycline for wound infection, reports sudden "bursting" at wound followed by bleeding, tx'd from OSH for ortho consult for wound dehiscence, abscess, and infection, to begin IV ABX.  Labs from OSH: WBC 12.9, Hgb 11.8, Plt 367, INR 1.9, Na 133, K 3.8, SCr 1.3, alb 2.0, LA 2.2.  Goal of Therapy:  Vancomycin trough level 10-15 mcg/ml   Plan:  Rec'd Zosyn 3.375g at OSH at 1600; will give vancomycin 2500mg  IV x1 followed by 1500mg  IV Q12H as well as Zosyn 3.375g IV Q8H and monitor CBC, Cx, levels prn.  Vernard GamblesVeronda Winni Ehrhard, PharmD, BCPS  05/15/2015,3:20 AM

## 2015-05-16 ENCOUNTER — Encounter (HOSPITAL_COMMUNITY)
Admission: AD | Disposition: A | Discharge status: Home / Self Care | Payer: Self-pay | Source: Other Acute Inpatient Hospital | Attending: Internal Medicine

## 2015-05-16 ENCOUNTER — Inpatient Hospital Stay (HOSPITAL_COMMUNITY): Payer: 59 | Admitting: Anesthesiology

## 2015-05-16 DIAGNOSIS — I10 Essential (primary) hypertension: Secondary | ICD-10-CM

## 2015-05-16 DIAGNOSIS — E785 Hyperlipidemia, unspecified: Secondary | ICD-10-CM

## 2015-05-16 HISTORY — PX: AMPUTATION: SHX166

## 2015-05-16 HISTORY — PX: STUMP REVISION: SHX6102

## 2015-05-16 LAB — CBC WITH DIFFERENTIAL/PLATELET
BASOS PCT: 1 % (ref 0–1)
Basophils Absolute: 0.1 10*3/uL (ref 0.0–0.1)
Eosinophils Absolute: 0.3 10*3/uL (ref 0.0–0.7)
Eosinophils Relative: 3 % (ref 0–5)
HCT: 33.2 % — ABNORMAL LOW (ref 39.0–52.0)
Hemoglobin: 10.8 g/dL — ABNORMAL LOW (ref 13.0–17.0)
LYMPHS ABS: 1.8 10*3/uL (ref 0.7–4.0)
Lymphocytes Relative: 20 % (ref 12–46)
MCH: 27.1 pg (ref 26.0–34.0)
MCHC: 32.5 g/dL (ref 30.0–36.0)
MCV: 83.2 fL (ref 78.0–100.0)
MONOS PCT: 7 % (ref 3–12)
Monocytes Absolute: 0.6 10*3/uL (ref 0.1–1.0)
NEUTROS ABS: 6.4 10*3/uL (ref 1.7–7.7)
NEUTROS PCT: 69 % (ref 43–77)
PLATELETS: 355 10*3/uL (ref 150–400)
RBC: 3.99 MIL/uL — ABNORMAL LOW (ref 4.22–5.81)
RDW: 14 % (ref 11.5–15.5)
WBC: 9.2 10*3/uL (ref 4.0–10.5)

## 2015-05-16 LAB — CBC
HEMATOCRIT: 34.5 % — AB (ref 39.0–52.0)
HEMATOCRIT: 25.2 % — AB (ref 39.0–52.0)
HEMOGLOBIN: 8 g/dL — AB (ref 13.0–17.0)
Hemoglobin: 10.9 g/dL — ABNORMAL LOW (ref 13.0–17.0)
MCH: 26.6 pg (ref 26.0–34.0)
MCH: 26.6 pg (ref 26.0–34.0)
MCHC: 31.6 g/dL (ref 30.0–36.0)
MCHC: 31.7 g/dL (ref 30.0–36.0)
MCV: 84.1 fL (ref 78.0–100.0)
MCV: 83.7 fL (ref 78.0–100.0)
PLATELETS: 360 10*3/uL (ref 150–400)
Platelets: 339 10*3/uL (ref 150–400)
RBC: 4.1 MIL/uL — AB (ref 4.22–5.81)
RBC: 3.01 MIL/uL — ABNORMAL LOW (ref 4.22–5.81)
RDW: 14.1 % (ref 11.5–15.5)
RDW: 14.2 % (ref 11.5–15.5)
WBC: 9.7 10*3/uL (ref 4.0–10.5)
WBC: 11.3 10*3/uL — ABNORMAL HIGH (ref 4.0–10.5)

## 2015-05-16 LAB — COMPREHENSIVE METABOLIC PANEL
ALK PHOS: 65 U/L (ref 38–126)
ALT: 20 U/L (ref 17–63)
AST: 26 U/L (ref 15–41)
Albumin: 1.9 g/dL — ABNORMAL LOW (ref 3.5–5.0)
Anion gap: 6 (ref 5–15)
BILIRUBIN TOTAL: 0.6 mg/dL (ref 0.3–1.2)
BUN: 10 mg/dL (ref 6–20)
CHLORIDE: 108 mmol/L (ref 101–111)
CO2: 23 mmol/L (ref 22–32)
Calcium: 8.1 mg/dL — ABNORMAL LOW (ref 8.9–10.3)
Creatinine, Ser: 1.02 mg/dL (ref 0.61–1.24)
GFR calc Af Amer: >60 mL/min (ref >60)
Glucose, Bld: 271 mg/dL — ABNORMAL HIGH (ref 65–99)
Potassium: 4 mmol/L (ref 3.5–5.1)
Sodium: 137 mmol/L (ref 135–145)
Total Protein: 6.4 g/dL — ABNORMAL LOW (ref 6.5–8.1)

## 2015-05-16 LAB — BASIC METABOLIC PANEL
ANION GAP: 8 (ref 5–15)
BUN: 12 mg/dL (ref 6–20)
CO2: 23 mmol/L (ref 22–32)
CREATININE: 0.93 mg/dL (ref 0.61–1.24)
Calcium: 8.1 mg/dL — ABNORMAL LOW (ref 8.9–10.3)
Chloride: 108 mmol/L (ref 101–111)
GFR calc Af Amer: >60 mL/min (ref >60)
GFR calc non Af Amer: >60 mL/min (ref >60)
GLUCOSE: 227 mg/dL — AB (ref 65–99)
Potassium: 4.6 mmol/L (ref 3.5–5.1)
SODIUM: 139 mmol/L (ref 135–145)

## 2015-05-16 LAB — GLUCOSE, CAPILLARY
GLUCOSE-CAPILLARY: 207 mg/dL — AB (ref 65–99)
GLUCOSE-CAPILLARY: 257 mg/dL — AB (ref 65–99)
Glucose-Capillary: 212 mg/dL — ABNORMAL HIGH (ref 65–99)
Glucose-Capillary: 234 mg/dL — ABNORMAL HIGH (ref 65–99)
Glucose-Capillary: 254 mg/dL — ABNORMAL HIGH (ref 65–99)
Glucose-Capillary: 193 mg/dL — ABNORMAL HIGH (ref 65–99)

## 2015-05-16 LAB — PROTIME-INR
INR: 2.16 — ABNORMAL HIGH (ref 0.00–1.49)
PROTHROMBIN TIME: 23.9 s — AB (ref 11.6–15.2)

## 2015-05-16 LAB — SURGICAL PCR SCREEN
MRSA, PCR: NEGATIVE
Staphylococcus aureus: NEGATIVE

## 2015-05-16 LAB — HEMOGLOBIN A1C
Hgb A1c MFr Bld: 7.2 % — ABNORMAL HIGH (ref 4.8–5.6)
Mean Plasma Glucose: 160 mg/dL

## 2015-05-16 SURGERY — AMPUTATION BELOW KNEE
Anesthesia: General | Site: Knee | Laterality: Right

## 2015-05-16 MED ORDER — SODIUM CHLORIDE 0.9 % IV SOLN
INTRAVENOUS | Status: DC | PRN
  Administered 2015-05-16: 18:00:00 via INTRAVENOUS

## 2015-05-16 MED ORDER — VANCOMYCIN HCL 1000 MG IV SOLR
INTRAVENOUS | Status: AC
  Filled 2015-05-16: qty 1000

## 2015-05-16 MED ORDER — ONDANSETRON HCL 4 MG/2ML IJ SOLN: 4.0000 mg | Freq: Four times a day (QID) | INTRAMUSCULAR | Status: DC | PRN

## 2015-05-16 MED ORDER — ACETAMINOPHEN 325 MG PO TABS: 650.0000 mg | ORAL_TABLET | Freq: Four times a day (QID) | ORAL | Status: DC | PRN

## 2015-05-16 MED ORDER — DEXTROSE 5 % IV SOLN
500.0000 mg | Freq: Four times a day (QID) | INTRAVENOUS | Status: DC | PRN
  Filled 2015-05-16: qty 5

## 2015-05-16 MED ORDER — METHOCARBAMOL 500 MG PO TABS
500.0000 mg | ORAL_TABLET | Freq: Four times a day (QID) | ORAL | Status: DC | PRN
  Administered 2015-05-16: 500 mg via ORAL

## 2015-05-16 MED ORDER — FENTANYL CITRATE (PF) 250 MCG/5ML IJ SOLN
INTRAMUSCULAR | Status: AC
  Filled 2015-05-16: qty 5

## 2015-05-16 MED ORDER — METHOCARBAMOL 500 MG PO TABS
ORAL_TABLET | ORAL | Status: AC
  Filled 2015-05-16: qty 1

## 2015-05-16 MED ORDER — PHENYLEPHRINE HCL 10 MG/ML IJ SOLN
INTRAMUSCULAR | Status: DC | PRN
  Administered 2015-05-16: 120 ug via INTRAVENOUS
  Administered 2015-05-16: 160 ug via INTRAVENOUS
  Administered 2015-05-16 (×3): 120 ug via INTRAVENOUS

## 2015-05-16 MED ORDER — 0.9 % SODIUM CHLORIDE (POUR BTL) OPTIME
TOPICAL | Status: DC | PRN
  Administered 2015-05-16: 1000 mL

## 2015-05-16 MED ORDER — OXYCODONE HCL 5 MG PO TABS
5.0000 mg | ORAL_TABLET | Freq: Once | ORAL | Status: AC | PRN
  Administered 2015-05-16: 5 mg via ORAL

## 2015-05-16 MED ORDER — ACETAMINOPHEN 650 MG RE SUPP: 650.0000 mg | Freq: Four times a day (QID) | RECTAL | Status: DC | PRN

## 2015-05-16 MED ORDER — OXYCODONE HCL 5 MG/5ML PO SOLN: 5.0000 mg | Freq: Once | ORAL | Status: AC | PRN

## 2015-05-16 MED ORDER — PROPOFOL 10 MG/ML IV BOLUS
INTRAVENOUS | Status: DC | PRN
  Administered 2015-05-16: 200 mg via INTRAVENOUS

## 2015-05-16 MED ORDER — GENTAMICIN SULFATE 40 MG/ML IJ SOLN
INTRAMUSCULAR | Status: AC
  Filled 2015-05-16: qty 4

## 2015-05-16 MED ORDER — LACTATED RINGERS IV SOLN
INTRAVENOUS | Status: DC | PRN
  Administered 2015-05-16: 17:00:00 via INTRAVENOUS

## 2015-05-16 MED ORDER — LIDOCAINE HCL (CARDIAC) 20 MG/ML IV SOLN
INTRAVENOUS | Status: DC | PRN
  Administered 2015-05-16: 80 mg via INTRAVENOUS

## 2015-05-16 MED ORDER — FENTANYL CITRATE (PF) 100 MCG/2ML IJ SOLN
25.0000 ug | INTRAMUSCULAR | Status: DC | PRN
  Administered 2015-05-16 (×4): 25 ug via INTRAVENOUS

## 2015-05-16 MED ORDER — SODIUM CHLORIDE 0.45 % IV SOLN
INTRAVENOUS | Status: DC
  Administered 2015-05-16: 08:00:00 via INTRAVENOUS

## 2015-05-16 MED ORDER — MORPHINE SULFATE 2 MG/ML IJ SOLN
INTRAMUSCULAR | Status: AC
  Filled 2015-05-16: qty 1

## 2015-05-16 MED ORDER — OXYCODONE HCL 5 MG PO TABS
5.0000 mg | ORAL_TABLET | ORAL | Status: DC | PRN
  Administered 2015-05-16 – 2015-05-20 (×5): 15 mg via ORAL
  Filled 2015-05-16 (×6): qty 3

## 2015-05-16 MED ORDER — LACTATED RINGERS IV SOLN
INTRAVENOUS | Status: DC
  Administered 2015-05-16: 17:00:00 via INTRAVENOUS

## 2015-05-16 MED ORDER — FENTANYL CITRATE (PF) 100 MCG/2ML IJ SOLN
INTRAMUSCULAR | Status: AC
  Filled 2015-05-16: qty 2

## 2015-05-16 MED ORDER — GENTAMICIN SULFATE 40 MG/ML IJ SOLN
INTRAMUSCULAR | Status: DC | PRN
  Administered 2015-05-16 (×2): 80 mg

## 2015-05-16 MED ORDER — METOCLOPRAMIDE HCL 5 MG/ML IJ SOLN
5.0000 mg | Freq: Three times a day (TID) | INTRAMUSCULAR | Status: DC | PRN
Start: 2015-05-16 — End: 2015-05-18

## 2015-05-16 MED ORDER — METOCLOPRAMIDE HCL 5 MG PO TABS: 5.0000 mg | ORAL_TABLET | Freq: Three times a day (TID) | ORAL | Status: DC | PRN

## 2015-05-16 MED ORDER — ALBUMIN HUMAN 5 % IV SOLN
INTRAVENOUS | Status: DC | PRN
  Administered 2015-05-16: 18:00:00 via INTRAVENOUS

## 2015-05-16 MED ORDER — MIDAZOLAM HCL 2 MG/2ML IJ SOLN
INTRAMUSCULAR | Status: AC
  Administered 2015-05-16: 2 mg via INTRAVENOUS
  Filled 2015-05-16: qty 2

## 2015-05-16 MED ORDER — OXYCODONE HCL 5 MG PO TABS
ORAL_TABLET | ORAL | Status: AC
  Filled 2015-05-16: qty 1

## 2015-05-16 MED ORDER — ACETAMINOPHEN 325 MG PO TABS: 325.0000 mg | ORAL_TABLET | ORAL | Status: DC | PRN

## 2015-05-16 MED ORDER — VANCOMYCIN HCL 1000 MG IV SOLR
INTRAVENOUS | Status: DC | PRN
  Administered 2015-05-16: 1000 mg

## 2015-05-16 MED ORDER — FENTANYL CITRATE (PF) 100 MCG/2ML IJ SOLN
INTRAMUSCULAR | Status: DC | PRN
  Administered 2015-05-16 (×4): 50 ug via INTRAVENOUS

## 2015-05-16 MED ORDER — ONDANSETRON HCL 4 MG/2ML IJ SOLN
INTRAMUSCULAR | Status: DC | PRN
  Administered 2015-05-16: 4 mg via INTRAVENOUS

## 2015-05-16 MED ORDER — ACETAMINOPHEN 160 MG/5ML PO SOLN
325.0000 mg | ORAL | Status: DC | PRN
  Filled 2015-05-16: qty 20.3

## 2015-05-16 MED ORDER — ONDANSETRON HCL 4 MG PO TABS: 4.0000 mg | ORAL_TABLET | Freq: Four times a day (QID) | ORAL | Status: DC | PRN

## 2015-05-16 MED ORDER — VANCOMYCIN HCL IN DEXTROSE 1-5 GM/200ML-% IV SOLN
INTRAVENOUS | Status: AC
  Filled 2015-05-16: qty 200

## 2015-05-16 SURGICAL SUPPLY — 43 items
BLADE SAW RECIP 87.9 MT (BLADE) ×2 IMPLANT
BLADE SURG 21 STRL SS (BLADE) ×4 IMPLANT
BNDG COHESIVE 6X5 TAN STRL LF (GAUZE/BANDAGES/DRESSINGS) ×2 IMPLANT
BNDG GAUZE ELAST 4 BULKY (GAUZE/BANDAGES/DRESSINGS) ×2 IMPLANT
COVER SURGICAL LIGHT HANDLE (MISCELLANEOUS) ×4 IMPLANT
DRAPE EXTREMITY T 121X128X90 (DRAPE) ×2 IMPLANT
DRAPE INCISE IOBAN 66X45 STRL (DRAPES) ×2 IMPLANT
DRAPE PROXIMA HALF (DRAPES) ×4 IMPLANT
DRAPE U-SHAPE 47X51 STRL (DRAPES) ×2 IMPLANT
DRSG ADAPTIC 3X8 NADH LF (GAUZE/BANDAGES/DRESSINGS) ×2 IMPLANT
DRSG EMULSION OIL 3X3 NADH (GAUZE/BANDAGES/DRESSINGS) ×2 IMPLANT
DRSG PAD ABDOMINAL 8X10 ST (GAUZE/BANDAGES/DRESSINGS) ×2 IMPLANT
DRSG VAC ATS SM SENSATRAC (GAUZE/BANDAGES/DRESSINGS) ×2 IMPLANT
DURAPREP 26ML APPLICATOR (WOUND CARE) ×2 IMPLANT
ELECT REM PT RETURN 9FT ADLT (ELECTROSURGICAL) ×2
ELECTRODE REM PT RTRN 9FT ADLT (ELECTROSURGICAL) ×1 IMPLANT
GAUZE SPONGE 4X4 12PLY STRL (GAUZE/BANDAGES/DRESSINGS) ×2 IMPLANT
GLOVE BIOGEL PI IND STRL 9 (GLOVE) ×1 IMPLANT
GLOVE BIOGEL PI INDICATOR 9 (GLOVE) ×1
GLOVE SURG ORTHO 9.0 STRL STRW (GLOVE) ×2 IMPLANT
GOWN STRL REUS W/ TWL XL LVL3 (GOWN DISPOSABLE) ×2 IMPLANT
GOWN STRL REUS W/TWL XL LVL3 (GOWN DISPOSABLE) ×2
HOVERMATT SINGLE USE (MISCELLANEOUS) ×2 IMPLANT
KIT BASIN OR (CUSTOM PROCEDURE TRAY) ×2 IMPLANT
KIT ROOM TURNOVER OR (KITS) ×2 IMPLANT
KIT STIMULAN RAPID CURE 5CC (Orthopedic Implant) ×2 IMPLANT
MANIFOLD NEPTUNE II (INSTRUMENTS) ×2 IMPLANT
NS IRRIG 1000ML POUR BTL (IV SOLUTION) ×2 IMPLANT
PACK GENERAL/GYN (CUSTOM PROCEDURE TRAY) ×2 IMPLANT
PAD ABD 8X10 STRL (GAUZE/BANDAGES/DRESSINGS) ×2 IMPLANT
PAD ARMBOARD 7.5X6 YLW CONV (MISCELLANEOUS) ×4 IMPLANT
PAD NEG PRESSURE SENSATRAC (MISCELLANEOUS) ×2 IMPLANT
SPONGE GAUZE 4X4 12PLY STER LF (GAUZE/BANDAGES/DRESSINGS) ×2 IMPLANT
SPONGE LAP 18X18 X RAY DECT (DISPOSABLE) ×6 IMPLANT
STAPLER VISISTAT 35W (STAPLE) IMPLANT
STOCKINETTE IMPERVIOUS LG (DRAPES) ×2 IMPLANT
SUT ETHILON 2 0 PSLX (SUTURE) ×6 IMPLANT
SUT SILK 2 0 (SUTURE) ×1
SUT SILK 2-0 18XBRD TIE 12 (SUTURE) ×1 IMPLANT
SUT VIC AB 1 CTX 27 (SUTURE) ×4 IMPLANT
TOWEL OR 17X24 6PK STRL BLUE (TOWEL DISPOSABLE) ×2 IMPLANT
TOWEL OR 17X26 10 PK STRL BLUE (TOWEL DISPOSABLE) ×2 IMPLANT
WATER STERILE IRR 1000ML POUR (IV SOLUTION) ×2 IMPLANT

## 2015-05-16 NOTE — Consult Note (Signed)
Reason for Consult: Abscess right transtibial amputation Referring Physician: Dr. [name] is an 54 y.o. male.  HPI: Patient is a 54-year-old gentleman type 2 diabetes who presents with purulent abscess right transtibial amputation. Patient has failed conservative therapy with oral antibiotics.  Past Medical History  Diagnosis Date  . Bronchitis     hx of;last time about 5yrs ago  . Neuromuscular disorder     diabetic neuropathy in feet  . Hyperlipidemia     takes Atorvastatin daily  . Heart murmur     Hx: of as a child  . Type II diabetes mellitus     takes Metformin bid and Lantus nightly  . Exertional shortness of breath   . Headache(784.0)     "monthly" (08/19/2013)  . Arthritis     "ankles and feet" (08/19/2013)  . Chronic back pain     Past Surgical History  Procedure Laterality Date  . Toe amputation Right 2012    at [facility], and removal of partial of second to 2012  . Tonsillectomy    . Amputation Right 01/02/2013    Procedure: AMPUTATION BELOW KNEE;  Surgeon: Marcus V Duda, MD;  Location: [facility] OR;  Service: Orthopedics;  Laterality: Right;  Right Below Knee Amputation  . Esophagogastroduodenoscopy    . Amputation Left 04/25/2013    Procedure: AMPUTATION RAY;  Surgeon: Marcus V Duda, MD;  Location: [facility] OR;  Service: Orthopedics;  Laterality: Left;  Left Foot 3rd Ray Amputation  . Amputation Left 08/03/2013    Procedure: Left Midfoot Amputation;  Surgeon: Marcus V Duda, MD;  Location: [facility] OR;  Service: Orthopedics;  Laterality: Left;  Left Foot Transmetatarsal Amputation  . Below knee leg amputation Left 08/19/2013  . Amputation Left 08/19/2013    Procedure: AMPUTATION BELOW KNEE- left;  Surgeon: Marcus V Duda, MD;  Location: [facility] OR;  Service: Orthopedics;  Laterality: Left;  Left Below Knee Amputation    Family History  Problem Relation Age of Onset  . Other Mother   . Other Father     Social History:  reports that he has quit smoking. His smoking use included  Cigarettes. He quit after 2 years of use. He has never used smokeless tobacco. He reports that he drinks alcohol. He reports that he uses illicit drugs.  Allergies:  Allergies  Allergen Reactions  . Bactroban [Mupirocin Calcium] Hives    Rash, swelling  . Sulfamethoxazole-Trimethoprim Hives  . Shellfish Allergy Rash    Medications: I have reviewed the patient's current medications.  Results for orders placed or performed during the hospital encounter of 05/15/15 (from the past 48 hour(s))  Glucose, capillary     Status: Abnormal   Collection Time: 05/15/15  2:07 AM  Result Value Ref Range   Glucose-Capillary 202 (H) 65 - 99 mg/dL  CBC     Status: Abnormal   Collection Time: 05/15/15  3:54 AM  Result Value Ref Range   WBC 10.6 (H) 4.0 - 10.5 K/uL   RBC 4.10 (L) 4.22 - 5.81 MIL/uL   Hemoglobin 11.0 (L) 13.0 - 17.0 g/dL   HCT 34.0 (L) 39.0 - 52.0 %   MCV 82.9 78.0 - 100.0 fL   MCH 26.8 26.0 - 34.0 pg   MCHC 32.4 30.0 - 36.0 g/dL   RDW 13.9 11.5 - 15.5 %   Platelets 318 150 - 400 K/uL  Basic metabolic panel     Status: Abnormal   Collection Time: 05/15/15  3:54 AM  Result Value Ref   Range   Sodium 135 135 - 145 mmol/L   Potassium 3.4 (L) 3.5 - 5.1 mmol/L   Chloride 99 (L) 101 - 111 mmol/L   CO2 24 22 - 32 mmol/L   Glucose, Bld 229 (H) 65 - 99 mg/dL   BUN 14 6 - 20 mg/dL   Creatinine, Ser 1.04 0.61 - 1.24 mg/dL   Calcium 8.1 (L) 8.9 - 10.3 mg/dL   GFR calc non Af Amer >60 >60 mL/min   GFR calc Af Amer >60 >60 mL/min    Comment: (NOTE) The eGFR has been calculated using the CKD EPI equation. This calculation has not been validated in all clinical situations. eGFR's persistently <60 mL/min signify possible Chronic Kidney Disease.    Anion gap 12 5 - 15  Glucose, capillary     Status: Abnormal   Collection Time: 05/15/15  6:35 AM  Result Value Ref Range   Glucose-Capillary 207 (H) 65 - 99 mg/dL  Glucose, capillary     Status: Abnormal   Collection Time: 05/15/15  7:46 AM   Result Value Ref Range   Glucose-Capillary 212 (H) 65 - 99 mg/dL   Comment 1 Notify RN   Glucose, capillary     Status: Abnormal   Collection Time: 05/15/15 11:18 AM  Result Value Ref Range   Glucose-Capillary 195 (H) 65 - 99 mg/dL   Comment 1 Notify RN   Glucose, capillary     Status: Abnormal   Collection Time: 05/15/15  3:53 PM  Result Value Ref Range   Glucose-Capillary 240 (H) 65 - 99 mg/dL   Comment 1 Notify RN   Glucose, capillary     Status: Abnormal   Collection Time: 05/15/15  8:36 PM  Result Value Ref Range   Glucose-Capillary 224 (H) 65 - 99 mg/dL  Glucose, capillary     Status: Abnormal   Collection Time: 05/16/15 12:09 AM  Result Value Ref Range   Glucose-Capillary 257 (H) 65 - 99 mg/dL  Glucose, capillary     Status: Abnormal   Collection Time: 05/16/15  5:11 AM  Result Value Ref Range   Glucose-Capillary 212 (H) 65 - 99 mg/dL    Mr Tibia Fibula Right Wo Contrast  05/15/2015   CLINICAL DATA:  Redness and drainage from the right stump. Evaluate for osteomyelitis.  EXAM: MRI OF LOWER RIGHT EXTREMITY WITHOUT CONTRAST  TECHNIQUE: Multiplanar, multisequence MR imaging of the right proximal tibia/fibula was performed. No intravenous contrast was administered.  COMPARISON:  None.  FINDINGS: There is no focal marrow signal abnormality. There is no marrow edema. There is no T1 marrow signal abnormality. There is no cortical destruction. There is no periosteal reaction.  There is severe soft tissue edema within the subcutaneous fat circumferentially involving the distal femur and proximal tibia most severe anteromedially. Along the anteromedial aspect of the distal stump there is a 3.2 x 8 x 5.5 cm complex fluid collection most concerning for an abscess. There are frondlike extensions of the fluid collection which extend more inferiorly and laterally.  There is muscle edema in the anterior tibial compartment musculature which may be neurogenic versus reactive secondary to  cellulitis versus less likely infectious. There is mild muscle edema along the periphery of the medial gastrocnemius muscle which may be reactive. There are low signal foci within the soft tissues of the stump which may reflect calcifications.  IMPRESSION: 1. Severe cellulitis of the distal femur and proximal right lower leg. 3.2 x 8 x 5.5 cm complex fluid collection   along the anteromedial aspect of the distal tibial stump most concerning for an abscess with frondlike extensions from the fluid collection which extend more inferiorly and laterally. There is no evidence of osteomyelitis of the distal tibial stump.   Electronically Signed   By: Kathreen Devoid   On: 05/15/2015 13:44    Review of Systems  All other systems reviewed and are negative.  Blood pressure 124/69, pulse 78, temperature 97.7 F (36.5 C), temperature source Oral, resp. rate 17, height 6' 4" (1.93 m), weight 152 kg (335 lb 1.6 oz), SpO2 97 %. Physical Exam On examination patient has cellulitis and purulent drainage from his right transtibial amputation. Assessment/Plan: Assessment: Abscess ulceration right transtibial amputation.  Plan: We will plan for revision of the right transtibial amputation today.  Nathin Saran V 05/16/2015, 6:35 AM

## 2015-05-16 NOTE — Anesthesia Postprocedure Evaluation (Signed)
Anesthesia Post Note  Patient: Gabriel Howe  Procedure(s) Performed: Procedure(s) (LRB): AMPUTATION BELOW KNEE/REVISION (Right)  Anesthesia type: General  Patient location: PACU  Post pain: Pain level controlled and Adequate analgesia  Post assessment: Post-op Vital signs reviewed, Patient's Cardiovascular Status Stable, Respiratory Function Stable, Patent Airway and Pain level controlled  Last Vitals:  Filed Vitals:   05/16/15 1933  BP: 101/61  Pulse: 79  Temp:   Resp: 17    Post vital signs: Reviewed and stable  Level of consciousness: awake, alert  and oriented  Complications: No apparent anesthesia complications

## 2015-05-16 NOTE — Anesthesia Preprocedure Evaluation (Addendum)
Anesthesia Evaluation  Patient identified by MRN, date of birth, ID band Patient awake    Reviewed: Allergy & Precautions, NPO status , Patient's Chart, lab work & pertinent test results, reviewed documented beta blocker date and time   History of Anesthesia Complications Negative for: history of anesthetic complications  Airway Mallampati: III  TM Distance: >3 FB     Dental  (+) Dental Advisory Given, Teeth Intact   Pulmonary shortness of breath and with exertion,  breath sounds clear to auscultation        Cardiovascular - Past MI and - CHF + Valvular Problems/Murmurs Rhythm:Regular     Neuro/Psych  Headaches,  Neuromuscular disease negative psych ROS   GI/Hepatic negative GI ROS, Neg liver ROS,   Endo/Other  diabetes, Poorly Controlled, Type 2  Renal/GU negative Renal ROS     Musculoskeletal  (+) Arthritis -,   Abdominal   Peds  Hematology negative hematology ROS (+)   Anesthesia Other Findings   Reproductive/Obstetrics negative OB ROS                           Anesthesia Physical Anesthesia Plan  ASA: III  Anesthesia Plan: General   Post-op Pain Management:    Induction: Intravenous  Airway Management Planned: LMA  Additional Equipment: None  Intra-op Plan:   Post-operative Plan: Extubation in OR  Informed Consent: I have reviewed the patients History and Physical, chart, labs and discussed the procedure including the risks, benefits and alternatives for the proposed anesthesia with the patient or authorized representative who has indicated his/her understanding and acceptance.   Dental advisory given  Plan Discussed with: CRNA and Surgeon  Anesthesia Plan Comments:         Anesthesia Quick Evaluation

## 2015-05-16 NOTE — Transfer of Care (Signed)
Immediate Anesthesia Transfer of Care Note  Patient: Gabriel Howe  Procedure(s) Performed: Procedure(s): AMPUTATION BELOW KNEE/REVISION (Right)  Patient Location: PACU  Anesthesia Type:General  Level of Consciousness: awake, alert , oriented and patient cooperative  Airway & Oxygen Therapy: Patient Spontanous Breathing and Patient connected to nasal cannula oxygen  Post-op Assessment: Report given to RN and Post -op Vital signs reviewed and unstable, Anesthesiologist notified  Post vital signs: Reviewed and stable  Last Vitals:  Filed Vitals:   05/16/15 1540  BP: 141/84  Pulse: 88  Temp: 36.6 C  Resp: 18    Complications: No apparent anesthesia complications

## 2015-05-16 NOTE — Progress Notes (Addendum)
[facility] PROGRESS NOTE  Gabriel Howe [id number]:[id number] DOB: Jun 02, 1961 DOA: 05/15/2015 PCP: Ardath Sax, MD  Assessment/Plan: 1-infection right LE stump -continue IV antibiotics -PRN analgesics -I&D/surgical revision by Dr. Lajoyce Corners on 7/18 -follow ortho rec's  2-uncontrolled diabetes: will continue SSI and lantus -lantus dose will be adjusted to 30 units BID, once taking PO's again. (recently adjusted to 35 units BID in outpatient setting) -CBG's in 200-250 range currently Infection contributing  3-essential HTN: not on medications at home -will monitor -pain contributing to elevated BP  4-HLD: will continue lipitor  5-leukocytosis  -due to #1 -will continue IV abx's for now -WBC's trending down/back to WNL    Code Status: Full Family Communication: no family at bedside Disposition Plan: to be determine    Consultants:  Dr. Lajoyce Corners  Procedures:  Right stump revision (I&D on 7/18)  Antibiotics:  vanc  Zosyn   HPI/Subjective: Afebrile, no CP, no SOB. Reports right stump pain is better controlled.  Objective: Filed Vitals:   05/16/15 1540  BP: 141/84  Pulse: 88  Temp: 97.8 F (36.6 C)  Resp: 18    Intake/Output Summary (Last 24 hours) at 05/16/15 1621 Last data filed at 05/16/15 1500  Gross per 24 hour  Intake    240 ml  Output   2000 ml  Net  -1760 ml   Filed Weights   05/15/15 0200  Weight: 152 kg (335 lb 1.6 oz)    Exam:   General:  Afebrile, denies nausea, vomiting, CP and SOB. Pain on Right stump is better control, but still present  Cardiovascular: S1 and S2, no rubs or gallops, no murmurs   Respiratory: CTA bilaterally   Abdomen: obese, soft, NT, positive BS  Musculoskeletal: bilateral BKA; right stump with swelling, erythema, sero-purulent discharge  Data Reviewed: Basic Metabolic Panel:  Recent Labs Lab 05/15/15 0354 05/16/15 0533 05/16/15 0942  NA 135 139 137  K 3.4* 4.6 4.0  CL 99* 108 108  CO2 GLUCOSE 229* 227* 271*  BUN CREATININE 1.04 0.93 1.02  CALCIUM 8.1* 8.1* 8.1*   Liver Function Tests:  Recent Labs Lab 05/16/15 0942  AST 26  ALT 20  ALKPHOS 65  BILITOT 0.6  PROT 6.4*  ALBUMIN 1.9*   CBC:  Recent Labs Lab 05/15/15 0354 05/16/15 0533 05/16/15 0942  WBC 10.6* 9.7 9.2  NEUTROABS  --   --  6.4  HGB 11.0* 10.9* 10.8*  HCT 34.0* 34.5* 33.2*  MCV 82.9 84.1 83.2  PLT 318 339 355   CBG:  Recent Labs Lab 05/16/15 0009 05/16/15 0511 05/16/15 0812 05/16/15 1200 05/16/15 1543  GLUCAP 257* 212* 234* 254* 207*    Recent Results (from the past 240 hour(s))  Culture, blood (routine x 2)     Status: None (Preliminary result)   Collection Time: 05/15/15  3:54 AM  Result Value Ref Range Status   Specimen Description BLOOD RIGHT ANTECUBITAL  Final   Special Requests BOTTLES DRAWN AEROBIC ONLY 10CC  Final   Culture NO GROWTH 1 DAY  Final   Report Status PENDING  Incomplete  Culture, blood (routine x 2)     Status: None (Preliminary result)   Collection Time: 05/15/15  4:05 AM  Result Value Ref Range Status   Specimen Description BLOOD RIGHT ANTECUBITAL  Final   Special Requests BOTTLES DRAWN AEROBIC ONLY 10CC  Final   Culture NO GROWTH 1 DAY  Final   Report Status PENDING  Incomplete  Studies: Mr Tibia Fibula Right Wo Contrast  05/15/2015   CLINICAL DATA:  Redness and drainage from the right stump. Evaluate for osteomyelitis.  EXAM: MRI OF LOWER RIGHT EXTREMITY WITHOUT CONTRAST  TECHNIQUE: Multiplanar, multisequence MR imaging of the right proximal tibia/fibula was performed. No intravenous contrast was administered.  COMPARISON:  None.  FINDINGS: There is no focal marrow signal abnormality. There is no marrow edema. There is no T1 marrow signal abnormality. There is no cortical destruction. There is no periosteal reaction.  There is severe soft tissue edema within the subcutaneous fat circumferentially involving the distal femur and proximal  tibia most severe anteromedially. Along the anteromedial aspect of the distal stump there is a 3.2 x 8 x 5.5 cm complex fluid collection most concerning for an abscess. There are frondlike extensions of the fluid collection which extend more inferiorly and laterally.  There is muscle edema in the anterior tibial compartment musculature which may be neurogenic versus reactive secondary to cellulitis versus less likely infectious. There is mild muscle edema along the periphery of the medial gastrocnemius muscle which may be reactive. There are low signal foci within the soft tissues of the stump which may reflect calcifications.  IMPRESSION: 1. Severe cellulitis of the distal femur and proximal right lower leg. 3.2 x 8 x 5.5 cm complex fluid collection along the anteromedial aspect of the distal tibial stump most concerning for an abscess with frondlike extensions from the fluid collection which extend more inferiorly and laterally. There is no evidence of osteomyelitis of the distal tibial stump.   Electronically Signed   By: Elige KoHetal  Patel   On: 05/15/2015 13:44    Scheduled Meds: . atorvastatin  10 mg Oral QHS  . insulin aspart  0-9 Units Subcutaneous 6 times per day  . insulin glargine  12 Units Subcutaneous QHS  . piperacillin-tazobactam (ZOSYN)  IV  3.375 g Intravenous Q8H  . vancomycin  1,500 mg Intravenous Q12H   Continuous Infusions: . sodium chloride 75 mL/hr at 05/16/15 0821  . sodium chloride 100 mL/hr (05/15/15 0849)    Principal Problem:   Infection of amputation stump of right lower extremity Active Problems:   DM2 (diabetes mellitus, type 2)   S/P bilateral BKA (below knee amputation)   Infection of below knee amputation stump   Wound infection    Time spent: 35 minutes    Vassie LollMadera, Lafayette Dunlevy  [facility] Pager [phone]. If 7PM-7AM, please contact night-coverage at [email or web address], password [email or web address] 05/16/2015, 4:21 PM  LOS: 1 day

## 2015-05-16 NOTE — Progress Notes (Signed)
Results for Lynford CitizenICKERT, Airon (MRN [id number]) as of 05/16/2015 12:20  Ref. Range 05/15/2015 15:53 05/15/2015 20:36 05/16/2015 00:09 05/16/2015 05:11 05/16/2015 08:12  Glucose-Capillary Latest Ref Range: 65-99 mg/dL 409240 (H) 811224 (H) 914257 (H) 212 (H) 234 (H)  CBGs continue to be greater than 180 mg/dl. Recommend increasing Lantus to 15-20 units daily if blood sugars continue to be elevated. Takes Lantus 20 units daily at home. Continue Novolog  correction scale every 4 hours while NPO and then TID & HS when eating. May need to increase correction scale to MODERATE if blood sugars still elevated. Smith MinceKendra Jadin Kagel RN BSN CDE

## 2015-05-16 NOTE — Op Note (Signed)
05/15/2015 - 05/16/2015  6:13 PM  PATIENT:  Gabriel CitizenEdward Howe    PRE-OPERATIVE DIAGNOSIS:  POST Right BKA  POST-OPERATIVE DIAGNOSIS:  Same  PROCEDURE:  AMPUTATION BELOW KNEE/REVISION Placement of antibiotics beads Placement of wound VAC  SURGEON:  DUDA,MARCUS V, MD  PHYSICIAN ASSISTANT:None ANESTHESIA:   General  PREOPERATIVE INDICATIONS:  Gabriel Howe is a  54 y.o. male with a diagnosis of POST Right BKA who failed conservative measures and elected for surgical management.    The risks benefits and alternatives were discussed with the patient preoperatively including but not limited to the risks of infection, bleeding, nerve injury, cardiopulmonary complications, the need for revision surgery, among others, and the patient was willing to proceed.  OPERATIVE IMPLANTS: Antibody beads with 500 mg vancomycin and 160 mg gentamicin  OPERATIVE FINDINGS: Necrotic tissue deep within the transtibial amputation  OPERATIVE PROCEDURE: Patient was brought to the operating room and underwent a general and aesthetic. After adequate levels of anesthesia were obtained patient's right lower extremity was prepped using DuraPrep draped into a sterile field. A timeout was called. A fishmouth incision was made around the necrotic incision. This was carried down to bone the distal centimeter of the tibia and fibula were resected. The anterior aspects were beveled. Electrocautery was used for hemostasis. Necrotic muscle was excised. The wound was irrigated with normal saline. The incision was then closed using 2-0 nylon. A incisional wound VAC was applied this had a good suction fit at -75. Patient was extubated taken to the PACU in stable condition.

## 2015-05-17 ENCOUNTER — Encounter (HOSPITAL_COMMUNITY): Payer: Self-pay | Admitting: General Practice

## 2015-05-17 DIAGNOSIS — E11621 Type 2 diabetes mellitus with foot ulcer: Secondary | ICD-10-CM

## 2015-05-17 DIAGNOSIS — D72829 Elevated white blood cell count, unspecified: Secondary | ICD-10-CM

## 2015-05-17 DIAGNOSIS — L97509 Non-pressure chronic ulcer of other part of unspecified foot with unspecified severity: Secondary | ICD-10-CM

## 2015-05-17 LAB — GLUCOSE, CAPILLARY
GLUCOSE-CAPILLARY: 288 mg/dL — AB (ref 65–99)
GLUCOSE-CAPILLARY: 250 mg/dL — AB (ref 65–99)
Glucose-Capillary: 176 mg/dL — ABNORMAL HIGH (ref 65–99)
Glucose-Capillary: 190 mg/dL — ABNORMAL HIGH (ref 65–99)
Glucose-Capillary: 212 mg/dL — ABNORMAL HIGH (ref 65–99)
Glucose-Capillary: 217 mg/dL — ABNORMAL HIGH (ref 65–99)
Glucose-Capillary: 232 mg/dL — ABNORMAL HIGH (ref 65–99)
Glucose-Capillary: 257 mg/dL — ABNORMAL HIGH (ref 65–99)
Glucose-Capillary: 272 mg/dL — ABNORMAL HIGH (ref 65–99)

## 2015-05-17 LAB — CBC
HEMATOCRIT: 23.2 % — AB (ref 39.0–52.0)
HEMOGLOBIN: 7.3 g/dL — AB (ref 13.0–17.0)
MCH: 26.5 pg (ref 26.0–34.0)
MCHC: 31.5 g/dL (ref 30.0–36.0)
MCV: 84.4 fL (ref 78.0–100.0)
Platelets: 336 10*3/uL (ref 150–400)
RBC: 2.75 MIL/uL — AB (ref 4.22–5.81)
RDW: 14.2 % (ref 11.5–15.5)
WBC: 11.3 10*3/uL — ABNORMAL HIGH (ref 4.0–10.5)

## 2015-05-17 LAB — POCT I-STAT 4, (NA,K, GLUC, HGB,HCT)
GLUCOSE: 199 mg/dL — AB (ref 65–99)
HCT: 32 % — ABNORMAL LOW (ref 39.0–52.0)
Hemoglobin: 10.9 g/dL — ABNORMAL LOW (ref 13.0–17.0)
Potassium: 4.8 mmol/L (ref 3.5–5.1)
Sodium: 142 mmol/L (ref 135–145)

## 2015-05-17 LAB — VANCOMYCIN, TROUGH: Vancomycin Tr: 10 ug/mL (ref 10.0–20.0)

## 2015-05-17 LAB — HEMOGLOBIN AND HEMATOCRIT, BLOOD
HEMATOCRIT: 22.4 % — AB (ref 39.0–52.0)
Hemoglobin: 7.2 g/dL — ABNORMAL LOW (ref 13.0–17.0)

## 2015-05-17 MED ORDER — VANCOMYCIN HCL 10 G IV SOLR
1500.0000 mg | Freq: Three times a day (TID) | INTRAVENOUS | Status: DC
  Administered 2015-05-17 – 2015-05-20 (×9): 1500 mg via INTRAVENOUS
  Filled 2015-05-17 (×12): qty 1500

## 2015-05-17 MED ORDER — INSULIN GLARGINE 100 UNIT/ML ~~LOC~~ SOLN
30.0000 [IU] | Freq: Two times a day (BID) | SUBCUTANEOUS | Status: DC
  Administered 2015-05-17 – 2015-05-20 (×7): 30 [IU] via SUBCUTANEOUS
  Filled 2015-05-17 (×8): qty 0.3

## 2015-05-17 NOTE — Progress Notes (Signed)
PT Cancellation Note  Patient Details Name: Gabriel Howe MRN: [id number] DOB: April 02, 1961   Cancelled Treatment:    Reason Eval/Treat Not Completed: Patient declined, stating he is having too much back pain and doesn't really understand why PT was ordered.  Educated pt on role of PT and pt said that he would like to wait til tomorrow because his back hurts and his L prosthesis will be here (supposed to be brought in tonight)  and he can demonstrate how he transfers at home.  Will check on pt tomorrow.   Galaxy Borden LUBECK 05/17/2015, 2:26 PM

## 2015-05-17 NOTE — Progress Notes (Signed)
ANTIBIOTIC CONSULT NOTE - FOLLOW-UP  Pharmacy Consult for Vancocin and Zosyn Indication: wound infection   Labs:  Recent Labs  05/15/15 0354 05/16/15 0533 05/16/15 0942 05/16/15 1905 05/17/15 0328  WBC 10.6* 9.7 9.2 11.3* 11.3*  HGB 11.0* 10.9* 10.8* 8.0* 7.3*  PLT 318 339 355 360 336  CREATININE 1.04 0.93 1.02  --   --    Estimated Creatinine Clearance: 133.7 mL/min (by C-G formula based on Cr of 1.02).  Recent Labs  05/17/15 0328  [facility] L[facility]ANCOT[facility]OUGH 10     Microbiology: Recent Results (from the past 720 hour(s))  Culture, blood (routine x 2)     Status: None (Preliminary result)   Collection Time: 05/15/15  3:54 AM  Result Value Ref Range Status   Specimen Description BLOOD RIGHT ANTECUBITAL  Final   Special Requests BOTTLES DRAWN AEROBIC ONLY 10CC  Final   Culture NO GROWTH 1 DAY  Final   Report Status PENDING  Incomplete  Culture, blood (routine x 2)     Status: None (Preliminary result)   Collection Time: 05/15/15  4:05 AM  Result Value Ref Range Status   Specimen Description BLOOD RIGHT ANTECUBITAL  Final   Special Requests BOTTLES DRAWN AEROBIC ONLY 10CC  Final   Culture NO GROWTH 1 DAY  Final   Report Status PENDING  Incomplete  Surgical pcr screen     Status: None   Collection Time: 05/16/15  3:41 PM  Result Value Ref Range Status   MRSA, PCR NEGATIVE NEGATIVE Final   Staphylococcus aureus NEGATIVE NEGATIVE Final    Comment:        The Xpert SA Assay (FDA approved for NASAL specimens in patients over 53 years of age), is one component of a comprehensive surveillance program.  Test performance has been validated by [facility] for patients greater than or equal to 53 year old. It is not intended to diagnose infection nor to guide or monitor treatment.      Assessment: 54yo male just barely therapeutic on vancomycin using low goal trough; however, given extent of infection and requiring BKA revision and wound vac, will increase vanc trough to fully  cover.  Renal function remains consistent.  Goal of Therapy:  Vancomycin trough level 15-20 mcg/ml  Plan:  Will increase vancomycin to 1500mg  IV Q8H for calculated trough of ~18 and continue Zosyn 3.375g IV Q8H and monitor Cx.  Vernard GamblesVeronda Cheryl Chay, PharmD, BCPS  05/17/2015,5:07 AM

## 2015-05-17 NOTE — Progress Notes (Signed)
[facility] PROGRESS NOTE  Gabriel Howe XBJ:[id number] DOB: 03-Dec-1960 DOA: 05/15/2015 PCP: Ardath Sax, MD  Assessment/Plan: 1-infection right LE stump -continue current IV antibiotics -continue PRN analgesics -I&D/surgical revision by Dr. Lajoyce Corners on 7/18; patient with wound vac in place. Plan is to continue current tx (abx's and wound vac until Friday) -will follow ortho rec's  2-uncontrolled diabetes: will continue SSI and lantus -lantus dose will be adjusted to 30 units BID (recently adjusted to 35 units BID in outpatient setting) -will follow CBG's -Infection in his stump most likely contributing to CBG's fluctuation  3-essential HTN: not on medications at home -will monitor -pain contributing to elevated BP  4-HLD: will continue lipitor  5-leukocytosis  -due to #1 -will continue IV abx's as recommended by ortho -WBC's trending down/back to WNL   Code Status: Full Family Communication: no family at bedside Disposition Plan: to be determine    Consultants:  Dr. Lajoyce Corners  Procedures:  Right stump revision (I&D on 7/18)  Antibiotics:  vanc  Zosyn   HPI/Subjective: Afebrile, no CP, no SOB. S/P right stump surgical revision and drainage. Well tolerated and with improvement in pain. Wound vac in place.  Objective: Filed Vitals:   05/17/15 0533  BP: 101/67  Pulse: 80  Temp: 97.9 F (36.6 C)  Resp: 18    Intake/Output Summary (Last 24 hours) at 05/17/15 1235 Last data filed at 05/17/15 0629  Gross per 24 hour  Intake 4088.33 ml  Output   2100 ml  Net 1988.33 ml   Filed Weights   05/15/15 0200  Weight: 152 kg (335 lb 1.6 oz)    Exam:   General:  Afebrile, denies nausea, vomiting, CP and SOB. Pain on Right stump much better. S/p surgical revision; wound vac in place.   Cardiovascular: S1 and S2, no rubs or gallops, no murmurs   Respiratory: CTA bilaterally   Abdomen: obese, soft, NT, positive BS  Musculoskeletal: bilateral BKA; right  stump with wound vac in place; significantly less swollen. tender but much better.   Data Reviewed: Basic Metabolic Panel:  Recent Labs Lab 05/15/15 0354 05/16/15 0533 05/16/15 0942 05/16/15 1755  NA 135 139 137 142  K 3.4* 4.6 4.0 4.8  CL 99* 108 108  --   CO2 --   GLUCOSE 229* 227* 271* 199*  BUN --   CREATININE 1.04 0.93 1.02  --   CALCIUM 8.1* 8.1* 8.1*  --    Liver Function Tests:  Recent Labs Lab 05/16/15 0942  AST 26  ALT 20  ALKPHOS 65  BILITOT 0.6  PROT 6.4*  ALBUMIN 1.9*   CBC:  Recent Labs Lab 05/15/15 0354 05/16/15 0533 05/16/15 0942 05/16/15 1755 05/16/15 1905 05/17/15 0328  WBC 10.6* 9.7 9.2  --  11.3* 11.3*  NEUTROABS  --   --  6.4  --   --   --   HGB 11.0* 10.9* 10.8* 10.9* 8.0* 7.3*  HCT 34.0* 34.5* 33.2* 32.0* 25.2* 23.2*  MCV 82.9 84.1 83.2  --  83.7 84.4  PLT 318 339 355  --  360 336   CBG:  Recent Labs Lab 05/16/15 1827 05/16/15 2051 05/17/15 0017 05/17/15 0455 05/17/15 0745  GLUCAP 176* 193* 212* 232* 217*    Recent Results (from the past 240 hour(s))  Culture, blood (routine x 2)     Status: None (Preliminary result)   Collection Time: 05/15/15  3:54 AM  Result Value Ref Range Status  Specimen Description BLOOD RIGHT ANTECUBITAL  Final   Special Requests BOTTLES DRAWN AEROBIC ONLY 10CC  Final   Culture NO GROWTH 1 DAY  Final   Report Status PENDING  Incomplete  Culture, blood (routine x 2)     Status: None (Preliminary result)   Collection Time: 05/15/15  4:05 AM  Result Value Ref Range Status   Specimen Description BLOOD RIGHT ANTECUBITAL  Final   Special Requests BOTTLES DRAWN AEROBIC ONLY 10CC  Final   Culture NO GROWTH 1 DAY  Final   Report Status PENDING  Incomplete  Surgical pcr screen     Status: None   Collection Time: 05/16/15  3:41 PM  Result Value Ref Range Status   MRSA, PCR NEGATIVE NEGATIVE Final   Staphylococcus aureus NEGATIVE NEGATIVE Final    Comment:        The Xpert SA  Assay (FDA approved for NASAL specimens in patients over 81 years of age), is one component of a comprehensive surveillance program.  Test performance has been validated by [facility] for patients greater than or equal to 59 year old. It is not intended to diagnose infection nor to guide or monitor treatment.      Studies: Mr Tibia Fibula Right Wo Contrast  05/15/2015   CLINICAL DATA:  Redness and drainage from the right stump. Evaluate for osteomyelitis.  EXAM: MRI OF LOWER RIGHT EXTREMITY WITHOUT CONTRAST  TECHNIQUE: Multiplanar, multisequence MR imaging of the right proximal tibia/fibula was performed. No intravenous contrast was administered.  COMPARISON:  None.  FINDINGS: There is no focal marrow signal abnormality. There is no marrow edema. There is no T1 marrow signal abnormality. There is no cortical destruction. There is no periosteal reaction.  There is severe soft tissue edema within the subcutaneous fat circumferentially involving the distal femur and proximal tibia most severe anteromedially. Along the anteromedial aspect of the distal stump there is a 3.2 x 8 x 5.5 cm complex fluid collection most concerning for an abscess. There are frondlike extensions of the fluid collection which extend more inferiorly and laterally.  There is muscle edema in the anterior tibial compartment musculature which may be neurogenic versus reactive secondary to cellulitis versus less likely infectious. There is mild muscle edema along the periphery of the medial gastrocnemius muscle which may be reactive. There are low signal foci within the soft tissues of the stump which may reflect calcifications.  IMPRESSION: 1. Severe cellulitis of the distal femur and proximal right lower leg. 3.2 x 8 x 5.5 cm complex fluid collection along the anteromedial aspect of the distal tibial stump most concerning for an abscess with frondlike extensions from the fluid collection which extend more inferiorly and laterally.  There is no evidence of osteomyelitis of the distal tibial stump.   Electronically Signed   By: Elige Ko   On: 05/15/2015 13:44    Scheduled Meds: . atorvastatin  10 mg Oral QHS  . insulin aspart  0-9 Units Subcutaneous 6 times per day  . insulin glargine  30 Units Subcutaneous BID  . piperacillin-tazobactam (ZOSYN)  IV  3.375 g Intravenous Q8H  . vancomycin  1,500 mg Intravenous Q8H   Continuous Infusions: . sodium chloride 100 mL/hr at 05/16/15 2045  . lactated ringers 50 mL/hr at 05/16/15 1653    Principal Problem:   Infection of amputation stump of right lower extremity Active Problems:   DM2 (diabetes mellitus, type 2)   S/P bilateral BKA (below knee amputation)   Infection of below  knee amputation stump   Wound infection    Time spent: 35 minutes    Vassie LollMadera, Anamari Galeas  [facility] Pager [phone]. If 7PM-7AM, please contact night-coverage at [email or web address], password [email or web address] 05/17/2015, 12:35 PM  LOS: 2 days

## 2015-05-17 NOTE — Progress Notes (Signed)
Patient ID: Gabriel Howe, male   DOB: June 23, 1961, 54 y.o.   MRN: [id number] Postoperative day 1 revision right transtibial amputation for deep necrotic infection. Antibiotics beads were placed and an incisional wound VAC is placed. The wound VAC is functioning well.  Anticipated patient could be discharged to home on Friday. Would recommend continuing IV antibiotics and the wound VAC through this week.

## 2015-05-18 DIAGNOSIS — Z89512 Acquired absence of left leg below knee: Secondary | ICD-10-CM

## 2015-05-18 DIAGNOSIS — E119 Type 2 diabetes mellitus without complications: Secondary | ICD-10-CM

## 2015-05-18 DIAGNOSIS — Z794 Long term (current) use of insulin: Secondary | ICD-10-CM

## 2015-05-18 DIAGNOSIS — T8743 Infection of amputation stump, right lower extremity: Principal | ICD-10-CM

## 2015-05-18 DIAGNOSIS — Z89511 Acquired absence of right leg below knee: Secondary | ICD-10-CM

## 2015-05-18 LAB — GLUCOSE, CAPILLARY
GLUCOSE-CAPILLARY: 154 mg/dL — AB (ref 65–99)
GLUCOSE-CAPILLARY: 207 mg/dL — AB (ref 65–99)
GLUCOSE-CAPILLARY: 219 mg/dL — AB (ref 65–99)
Glucose-Capillary: 161 mg/dL — ABNORMAL HIGH (ref 65–99)
Glucose-Capillary: 202 mg/dL — ABNORMAL HIGH (ref 65–99)
Glucose-Capillary: 182 mg/dL — ABNORMAL HIGH (ref 65–99)

## 2015-05-18 NOTE — Progress Notes (Signed)
Repeat Hgb preformed per NP [facility] on call orders. Hgb returned as 7.2. [facility] NP on call paged x2. No response at this time. Pts VSS, in no apparent distress, resting comfortably in bed, no complaints at this time. Nursing will continue to monitor.

## 2015-05-18 NOTE — Progress Notes (Signed)
[facility] PROGRESS NOTE  Gabriel Howe [id number] DOB: [birth year] DOA: 05/15/2015 PCP: Ardath SaxPARKER, [name], MD  Assessment/Plan: 1-infection right LE stump -continue current IV antibiotics -continue PRN analgesics -I&D/surgical revision by Dr. Lajoyce Cornersuda on 7/18; patient with wound vac in place. Plan is to continue current tx (abx's and wound vac until Friday) -will follow ortho rec's  2-uncontrolled diabetes: will continue SSI and lantus -lantus dose will be adjusted to 30 units BID (recently adjusted to 35 units BID in outpatient setting) -will follow CBG's -Infection in his stump most likely contributing to CBG's fluctuation a1c 7  3-essential HTN: not on medications at home -will monitor -pain contributing to elevated BP  4-HLD: will continue lipitor  5-leukocytosis  -due to #1 -will continue IV abx's as recommended by ortho -WBC's trending down/back to WNL   Code Status: Full Family Communication: no family at bedside Disposition Plan: to be determine    Consultants:  Dr. Lajoyce Cornersuda  Procedures:  Right stump revision (I&D on 7/18)  Antibiotics:  vanc  Zosyn   HPI/Subjective: Pain well controlled, Wound vac in place right leg stump, declined physical therapy yesterday, states will try today. Adequate oral intake, will d/c ivf.  Objective: Filed Vitals:   05/18/15 0742  BP: 119/74  Pulse: 85  Temp: 98.1 F (36.7 C)  Resp: 16    Intake/Output Summary (Last 24 hours) at 05/18/15 1121 Last data filed at 05/18/15 0742  Gross per 24 hour  Intake    720 ml  Output   2300 ml  Net  -1580 ml   Filed Weights   05/15/15 0200  Weight: 152 kg (335 lb 1.6 oz)    Exam:   General:  Afebrile, denies nausea, vomiting, CP and SOB. Pain on Right stump much better. S/p surgical revision; wound vac in place.   Cardiovascular: S1 and S2, no rubs or gallops, no murmurs   Respiratory: CTA bilaterally   Abdomen: obese, soft, NT, positive BS  Musculoskeletal:  bilateral BKA; right stump with wound vac in place; significantly less swollen. tender but much better.   Data Reviewed: Basic Metabolic Panel:  Recent Labs Lab 05/15/15 0354 05/16/15 0533 05/16/15 0942 05/16/15 1755  NA 135 139 137 142  K 3.4* 4.6 4.0 4.8  CL 99* 108 108  --   CO2 24 23 23   --   GLUCOSE 229* 227* 271* 199*  BUN 14 12 10   --   CREATININE 1.04 0.93 1.02  --   CALCIUM 8.1* 8.1* 8.1*  --    Liver Function Tests:  Recent Labs Lab 05/16/15 0942  AST 26  ALT 20  ALKPHOS 65  BILITOT 0.6  PROT 6.4*  ALBUMIN 1.9*   CBC:  Recent Labs Lab 05/15/15 0354 05/16/15 0533 05/16/15 0942 05/16/15 1755 05/16/15 1905 05/17/15 0328 05/17/15 2130  WBC 10.6* 9.7 9.2  --  11.3* 11.3*  --   NEUTROABS  --   --  6.4  --   --   --   --   HGB 11.0* 10.9* 10.8* 10.9* 8.0* 7.3* 7.2*  HCT 34.0* 34.5* 33.2* 32.0* 25.2* 23.2* 22.4*  MCV 82.9 84.1 83.2  --  83.7 84.4  --   PLT 318 339 355  --  360 336  --    CBG:  Recent Labs Lab 05/17/15 1627 05/17/15 2015 05/18/15 0038 05/18/15 0404 05/18/15 0745  GLUCAP 257* 250* 219* 161* 154*    Recent Results (from the past 240 hour(s))  Culture, blood (routine x 2)  Status: None (Preliminary result)   Collection Time: 05/15/15  3:54 AM  Result Value Ref Range Status   Specimen Description BLOOD RIGHT ANTECUBITAL  Final   Special Requests BOTTLES DRAWN AEROBIC ONLY 10CC  Final   Culture NO GROWTH 2 DAYS  Final   Report Status PENDING  Incomplete  Culture, blood (routine x 2)     Status: None (Preliminary result)   Collection Time: 05/15/15  4:05 AM  Result Value Ref Range Status   Specimen Description BLOOD RIGHT ANTECUBITAL  Final   Special Requests BOTTLES DRAWN AEROBIC ONLY 10CC  Final   Culture NO GROWTH 2 DAYS  Final   Report Status PENDING  Incomplete  Surgical pcr screen     Status: None   Collection Time: 05/16/15  3:41 PM  Result Value Ref Range Status   MRSA, PCR NEGATIVE NEGATIVE Final   Staphylococcus  aureus NEGATIVE NEGATIVE Final    Comment:        The Xpert SA Assay (FDA approved for NASAL specimens in patients over 82 years of age), is one component of a comprehensive surveillance program.  Test performance has been validated by [facility] for patients greater than or equal to 60 year old. It is not intended to diagnose infection nor to guide or monitor treatment.      Studies: No results found.  Scheduled Meds: . atorvastatin  10 mg Oral QHS  . insulin aspart  0-9 Units Subcutaneous 6 times per day  . insulin glargine  30 Units Subcutaneous BID  . piperacillin-tazobactam (ZOSYN)  IV  3.375 g Intravenous Q8H  . vancomycin  1,500 mg Intravenous Q8H   Continuous Infusions:    Principal Problem:   Infection of amputation stump of right lower extremity Active Problems:   DM2 (diabetes mellitus, type 2)   S/P bilateral BKA (below knee amputation)   Infection of below knee amputation stump   Wound infection    Time spent: 25 minutes    Sakinah Rosamond MD PhD  [facility] Pager [phone]. If 7PM-7AM, please contact night-coverage at [email or web address], password [facility] 05/18/2015, 11:21 AM  LOS: 3 days

## 2015-05-18 NOTE — Progress Notes (Signed)
Patient ID: Gabriel Howe, male   DOB: 1961-02-02, 54 y.o.   MRN: [id number] Postoperative day 2 revision transtibial amputation. Patient is asymptomatic with a hemoglobin of 7.2. Patient will continue his IV antibiotics anticipate discharge on Friday if safe with transfers.

## 2015-05-18 NOTE — Evaluation (Signed)
Physical Therapy Evaluation Patient Details Name: Gabriel Howe MRN: [id number] DOB: November 29, 1960 Today's Date: 05/18/2015   History of Present Illness  revision right transtibial amputation for deep necrotic infection  Clinical Impression  Patient seen for limited initial evaluation. Patient willing to sit edge of bed but refused to attempt any transfers at this time. Patient states that there is just too much to deal with between the IV and wound vac. Patient refused all encouragement to attempt transfers at this time. Will attempt to further evaluate mobility at later session.     Follow Up Recommendations No PT follow up (patient reports that he doesn't want more therapy at home)    Equipment Recommendations  None recommended by PT (patient reports having all equipment needs)    Recommendations for Other Services       Precautions / Restrictions Precautions Precautions: Fall Required Braces or Orthoses: Other Brace/Splint (Lt BKA prosthetic)      Mobility  Bed Mobility Overal bed mobility: Independent             General bed mobility comments: supine/sit at edge of bed  Transfers                 General transfer comment: patient refused attempting transfers at this time. States he has too much going on with IV and wound vac.   Ambulation/Gait                Stairs            Wheelchair Mobility    Modified Rankin (Stroke Patients Only)       Balance Overall balance assessment: Needs assistance Sitting-balance support: Single extremity supported Sitting balance-Leahy Scale: Poor                                       Pertinent Vitals/Pain Pain Assessment: 0-10 Pain Score: 3  Pain Location: Rt LE Pain Descriptors / Indicators: Sore Pain Intervention(s): Monitored during session    Home Living Family/patient expects to be discharged to:: Private residence Living Arrangements: Parent Available Help at Discharge:  Family Type of Home: House Home Access: Ramped entrance     Home Layout: One level Home Equipment: Environmental consultantWalker - 2 wheels;Wheelchair - manual      Prior Function Level of Independence: Independent with assistive device(s) (prosthetics)               Hand Dominance        Extremity/Trunk Assessment               Lower Extremity Assessment: Overall WFL for tasks assessed         Communication   Communication: No difficulties  Cognition Arousal/Alertness: Awake/alert Behavior During Therapy: WFL for tasks assessed/performed Overall Cognitive Status: Within Functional Limits for tasks assessed                      General Comments      Exercises        Assessment/Plan    PT Assessment Patient needs continued PT services  PT Diagnosis Generalized weakness;Other (comment) (difficulty with transfers)   PT Problem List Decreased strength;Decreased activity tolerance;Decreased mobility;Decreased balance  PT Treatment Interventions DME instruction;Gait training;Functional mobility training;Therapeutic activities;Therapeutic exercise;Patient/family education;Wheelchair mobility training   PT Goals (Current goals can be found in the Care Plan section) Acute Rehab PT Goals Patient Stated Goal: return home and  work PT Goal Formulation: With patient Time For Goal Achievement: 06/01/15 Potential to Achieve Goals: Good    Frequency Min 3X/week   Barriers to discharge        Co-evaluation               End of Session   Activity Tolerance: Patient tolerated treatment well (sat edge of bed without difficulty, refused further mobility) Patient left: in bed;with call bell/phone within reach Nurse Communication: Mobility status (refusal to attempt transfers)         Time: 9528-4132 PT Time Calculation (min) (ACUTE ONLY): 15 min   Charges:   PT Evaluation $Initial PT Evaluation Tier I: 1 Procedure     PT G Codes:        Christiane Ha,  PT, CSCS Pager [phone] Office [phone]  05/18/2015, 4:02 PM

## 2015-05-18 NOTE — Progress Notes (Addendum)
Inpatient Diabetes Program Recommendations  AACE/ADA: New Consensus Statement on Inpatient Glycemic Control (2013)  Target Ranges:  Prepandial:   less than 140 mg/dL      Peak postprandial:   less than 180 mg/dL (1-2 hours)      Critically ill patients:  140 - 180 mg/dL   Inpatient Diabetes Program Recommendations Insulin - Basal: Fasting better with increased doses of basal insulin to 30 units bid Correction (SSI): Glucose is high following meals: Please consider either increase in correction to moderate tidwc and HS correction scale or add meal coverage per below Insulin - Meal Coverage: If continue correction at sensitive scale, please consider addition of meal coverae of 3 units tidwc.  Thank you Lenor CoffinAnn Kolina Kube, RN, MSN, CDE  Diabetes Inpatient Program Office: [phone] Pager: [phone] 8:00 am to 5:00 pm

## 2015-05-19 DIAGNOSIS — D62 Acute posthemorrhagic anemia: Secondary | ICD-10-CM

## 2015-05-19 LAB — BASIC METABOLIC PANEL
Anion gap: 3 — ABNORMAL LOW (ref 5–15)
BUN: 6 mg/dL (ref 6–20)
CALCIUM: 8.3 mg/dL — AB (ref 8.9–10.3)
CHLORIDE: 111 mmol/L (ref 101–111)
CO2: 28 mmol/L (ref 22–32)
Creatinine, Ser: 1.03 mg/dL (ref 0.61–1.24)
GFR calc Af Amer: >60 mL/min (ref >60)
GFR calc non Af Amer: >60 mL/min (ref >60)
GLUCOSE: 139 mg/dL — AB (ref 65–99)
Potassium: 3.7 mmol/L (ref 3.5–5.1)
Sodium: 142 mmol/L (ref 135–145)

## 2015-05-19 LAB — GLUCOSE, CAPILLARY
GLUCOSE-CAPILLARY: 124 mg/dL — AB (ref 65–99)
Glucose-Capillary: 147 mg/dL — ABNORMAL HIGH (ref 65–99)
Glucose-Capillary: 159 mg/dL — ABNORMAL HIGH (ref 65–99)
Glucose-Capillary: 160 mg/dL — ABNORMAL HIGH (ref 65–99)
Glucose-Capillary: 199 mg/dL — ABNORMAL HIGH (ref 65–99)
Glucose-Capillary: 214 mg/dL — ABNORMAL HIGH (ref 65–99)

## 2015-05-19 LAB — CBC
HEMATOCRIT: 21.2 % — AB (ref 39.0–52.0)
Hemoglobin: 6.8 g/dL — CL (ref 13.0–17.0)
MCH: 27.1 pg (ref 26.0–34.0)
MCHC: 32.1 g/dL (ref 30.0–36.0)
MCV: 84.5 fL (ref 78.0–100.0)
Platelets: 378 10*3/uL (ref 150–400)
RBC: 2.51 MIL/uL — ABNORMAL LOW (ref 4.22–5.81)
RDW: 14.2 % (ref 11.5–15.5)
WBC: 9 10*3/uL (ref 4.0–10.5)

## 2015-05-19 LAB — PREPARE RBC (CROSSMATCH)

## 2015-05-19 MED ORDER — SODIUM CHLORIDE 0.9 % IV SOLN: Freq: Once | INTRAVENOUS | Status: DC

## 2015-05-19 NOTE — Progress Notes (Signed)
[facility] PROGRESS NOTE  Gabriel Howe RUE:[id number] DOB: 04-09-1961 DOA: 05/15/2015 PCP: Ardath Sax, MD  Assessment/Plan:  1-infection right LE stump -continue current IV antibiotics vanc/zosyn, blood culture negative -continue PRN analgesics -I&D/surgical revision by Dr. Lajoyce Corners on 7/18; patient with wound vac in place. Plan is to continue current tx (abx's and wound vac until Friday) -will follow ortho rec's and advise on abx choice/durtion.  2-uncontrolled diabetes: will continue SSI and lantus -lantus dose will be adjusted to 30 units BID (recently adjusted to 35 units BID in outpatient setting) -will follow CBG's -Infection in his stump most likely contributing to CBG's fluctuation a1c 7  3-essential HTN: not on medications at home -stable   4-HLD: will continue lipitor  5-leukocytosis  -due to #1 -will continue IV abx's as recommended by ortho -WBC's trending down/back to WNL  6. Acute blood loss anemia: prbc total of 2units on 7/21  Code Status: Full Family Communication: no family at bedside Disposition Plan: likely home with [facility] 7/22   Consultants:  Dr. Lajoyce Corners  Procedures:  Right stump revision (I&D on 7/18)  prbc transfusion x2units on 7/21  Antibiotics:  vanc  Zosyn   HPI/Subjective: Wound vac with serosanguineoues fluids,  hgb 6.8 this am, awaiting blood transfusion, denies pain, no sob, does report fatigue   Objective: Filed Vitals:   05/19/15 0614  BP: 115/46  Pulse: 71  Temp: 98.6 F (37 C)  Resp: 16    Intake/Output Summary (Last 24 hours) at 05/19/15 1044 Last data filed at 05/19/15 0500  Gross per 24 hour  Intake    240 ml  Output    500 ml  Net   -260 ml   Filed Weights   05/15/15 0200  Weight: 152 kg (335 lb 1.6 oz)    Exam:   General:  Afebrile, denies nausea, vomiting, CP and SOB. Pain on Right stump much better. S/p surgical revision; wound vac in place.   Cardiovascular: S1 and S2, no rubs or gallops,  no murmurs   Respiratory: CTA bilaterally   Abdomen: obese, soft, NT, positive BS  Musculoskeletal: bilateral BKA; right stump with wound vac in place; significantly less swollen. tender but much better.   Data Reviewed: Basic Metabolic Panel:  Recent Labs Lab 05/15/15 0354 05/16/15 0533 05/16/15 0942 05/16/15 1755 05/19/15 0323  NA 135 139 137 142 142  K 3.4* 4.6 4.0 4.8 3.7  CL 99* 108 108  --  111  CO2 24 23 23   --  28  GLUCOSE 229* 227* 271* 199* 139*  BUN 14 12 10   --  6  CREATININE 1.04 0.93 1.02  --  1.03  CALCIUM 8.1* 8.1* 8.1*  --  8.3*   Liver Function Tests:  Recent Labs Lab 05/16/15 0942  AST 26  ALT 20  ALKPHOS 65  BILITOT 0.6  PROT 6.4*  ALBUMIN 1.9*   CBC:  Recent Labs Lab 05/16/15 0533 05/16/15 0942 05/16/15 1755 05/16/15 1905 05/17/15 0328 05/17/15 2130 05/19/15 0323  WBC 9.7 9.2  --  11.3* 11.3*  --  9.0  NEUTROABS  --  6.4  --   --   --   --   --   HGB 10.9* 10.8* 10.9* 8.0* 7.3* 7.2* 6.8*  HCT 34.5* 33.2* 32.0* 25.2* 23.2* 22.4* 21.2*  MCV 84.1 83.2  --  83.7 84.4  --  84.5  PLT 339 355  --  360 336  --  378   CBG:  Recent Labs Lab 05/18/15 1648  05/18/15 2002 05/19/15 0031 05/19/15 0400 05/19/15 0751  GLUCAP 207* 182* 147* 124* 160*    Recent Results (from the past 240 hour(s))  Culture, blood (routine x 2)     Status: None (Preliminary result)   Collection Time: 05/15/15  3:54 AM  Result Value Ref Range Status   Specimen Description BLOOD RIGHT ANTECUBITAL  Final   Special Requests BOTTLES DRAWN AEROBIC ONLY 10CC  Final   Culture NO GROWTH 3 DAYS  Final   Report Status PENDING  Incomplete  Culture, blood (routine x 2)     Status: None (Preliminary result)   Collection Time: 05/15/15  4:05 AM  Result Value Ref Range Status   Specimen Description BLOOD RIGHT ANTECUBITAL  Final   Special Requests BOTTLES DRAWN AEROBIC ONLY 10CC  Final   Culture NO GROWTH 3 DAYS  Final   Report Status PENDING  Incomplete  Surgical  pcr screen     Status: None   Collection Time: 05/16/15  3:41 PM  Result Value Ref Range Status   MRSA, PCR NEGATIVE NEGATIVE Final   Staphylococcus aureus NEGATIVE NEGATIVE Final    Comment:        The Xpert SA Assay (FDA approved for NASAL specimens in patients over 49 years of age), is one component of a comprehensive surveillance program.  Test performance has been validated by [facility] for patients greater than or equal to 44 year old. It is not intended to diagnose infection nor to guide or monitor treatment.      Studies: No results found.  Scheduled Meds: . sodium chloride   Intravenous Once  . sodium chloride   Intravenous Once  . atorvastatin  10 mg Oral QHS  . insulin aspart  0-9 Units Subcutaneous 6 times per day  . insulin glargine  30 Units Subcutaneous BID  . piperacillin-tazobactam (ZOSYN)  IV  3.375 g Intravenous Q8H  . vancomycin  1,500 mg Intravenous Q8H   Continuous Infusions:    Principal Problem:   Infection of amputation stump of right lower extremity Active Problems:   DM2 (diabetes mellitus, type 2)   S/P bilateral BKA (below knee amputation)   Infection of below knee amputation stump   Wound infection    Time spent: 35 minutes    Matina Rodier MD PhD  [facility] Pager [phone]. If 7PM-7AM, please contact night-coverage at [email or web address], password [facility] 05/19/2015, 10:44 AM  LOS: 4 days

## 2015-05-19 NOTE — Progress Notes (Signed)
PT Cancellation Note  Patient Details Name: Gabriel Howe MRN: [id number] DOB: 1961-05-10   Cancelled Treatment:    Reason Eval/Treat Not Completed: Medical issues which prohibited therapy. Patient is receiving blood at this time. HGB 6.8. Per RN patient does not want to get up while connected to lines. RN stated that patient was able to don prothesis and walk himself to the restroom. Will follow up in AM   Eh Sesay, Adline Potter 05/19/2015, 1:26 PM

## 2015-05-19 NOTE — Progress Notes (Signed)
Patient ID: Gabriel Howe, male   DOB: September 19, 1961, 54 y.o.   MRN: [id number] Wound VAC changed last night. Patient still has clear serosanguineous drainage. Plan for discharge to home tomorrow and discontinue wound VAC tomorrow.

## 2015-05-19 NOTE — Progress Notes (Signed)
.  CRITICAL VALUE ALERT  Critical value received:  Hgb 6.8  Date of notification:  7/21  Time of notification:  0545  Critical value read back:Yes.    Nurse who received alert:  Illene Regulus RN/Cavan Bearden Andrey Campanile RN  MD notified (1st page):  Donnamarie Poag NP ([facility])  Time of first page:  [phone]  MD notified (2nd page):  Time of second page:  Responding MD:  Donnamarie Poag NP ([facility])  Time MD responded:  [phone]

## 2015-05-20 DIAGNOSIS — Z7901 Long term (current) use of anticoagulants: Secondary | ICD-10-CM

## 2015-05-20 DIAGNOSIS — Z86718 Personal history of other venous thrombosis and embolism: Secondary | ICD-10-CM

## 2015-05-20 LAB — CULTURE, BLOOD (ROUTINE X 2)
CULTURE: NO GROWTH
CULTURE: NO GROWTH

## 2015-05-20 LAB — TYPE AND SCREEN
ABO/RH(D): B POS
Antibody Screen: NEGATIVE
Unit division: 0
Unit division: 0

## 2015-05-20 LAB — CBC
HEMATOCRIT: 25.4 % — AB (ref 39.0–52.0)
Hemoglobin: 8.2 g/dL — ABNORMAL LOW (ref 13.0–17.0)
MCH: 26.5 pg (ref 26.0–34.0)
MCHC: 32.3 g/dL (ref 30.0–36.0)
MCV: 82.2 fL (ref 78.0–100.0)
Platelets: 374 10*3/uL (ref 150–400)
RBC: 3.09 MIL/uL — ABNORMAL LOW (ref 4.22–5.81)
RDW: 14.9 % (ref 11.5–15.5)
WBC: 8.8 10*3/uL (ref 4.0–10.5)

## 2015-05-20 LAB — GLUCOSE, CAPILLARY
GLUCOSE-CAPILLARY: 117 mg/dL — AB (ref 65–99)
Glucose-Capillary: 130 mg/dL — ABNORMAL HIGH (ref 65–99)
Glucose-Capillary: 140 mg/dL — ABNORMAL HIGH (ref 65–99)
Glucose-Capillary: 136 mg/dL — ABNORMAL HIGH (ref 65–99)

## 2015-05-20 MED ORDER — OXYCODONE-ACETAMINOPHEN 5-325 MG PO TABS: 1.0000 | ORAL_TABLET | ORAL | Status: DC | PRN

## 2015-05-20 NOTE — Discharge Summary (Signed)
Physician Discharge Summary  Patient ID: Gabriel Howe MRN: [id number] DOB/AGE: 53-Nov-1962 53 y.o.  Admit date: 05/15/2015 Discharge date: 05/20/2015  Admission Diagnoses: Dehiscence right transtibial amputation  Discharge Diagnoses:  Principal Problem:   Infection of amputation stump of right lower extremity Active Problems:   DM2 (diabetes mellitus, type 2)   S/P bilateral BKA (below knee amputation)   Infection of below knee amputation stump   Wound infection   Discharged Condition: stable  Hospital Course: Patient's hospital course was essentially unremarkable. He underwent revision of the transtibial amputation. Postoperatively patient was treated with the wound VAC. Patient was discharged to home in stable condition.  Consults: None  Significant Diagnostic Studies: labs: Routine labs  Treatments: surgery: See operative note  Discharge Exam: Blood pressure 149/80, pulse 81, temperature 98.3 F (36.8 C), temperature source Oral, resp. rate 18, height  (1.93 m), weight 152 kg (335 lb 1.6 oz), SpO2 99 %. Incision/Wound: incision clean and dry  Disposition: 01-Home or Self Care  Discharge Instructions    Call MD / Call 911    Complete by:  As directed   If you experience chest pain or shortness of breath, CALL 911 and be transported to the hospital emergency room.  If you develope a fever above 101 F, pus (white drainage) or increased drainage or redness at the wound, or calf pain, call your surgeon's office.     Constipation Prevention    Complete by:  As directed   Drink plenty of fluids.  Prune juice may be helpful.  You may use a stool softener, such as Colace (over the counter) 100 mg twice a day.  Use MiraLax (over the counter) for constipation as needed.     Diet - low sodium heart healthy    Complete by:  As directed      Increase activity slowly as tolerated    Complete by:  As directed             Medication List    TAKE these medications        atorvastatin 10 MG tablet  Commonly known as:  LIPITOR  Take 10 mg by mouth at bedtime.     doxycycline 100 MG DR capsule  Commonly known as:  DORYX  Take 100 mg by mouth 2 (two) times daily.     insulin glargine 100 UNIT/ML injection  Commonly known as:  LANTUS  Inject 20 Units into the skin at bedtime.     metFORMIN 500 MG tablet  Commonly known as:  GLUCOPHAGE  Take 500 mg by mouth 2 (two) times daily.     oxyCODONE-acetaminophen 5-325 MG per tablet  Commonly known as:  ROXICET  Take 1 tablet by mouth every 4 (four) hours as needed for severe pain.     Vitamin D 2000 UNITS Caps  Take 1 capsule by mouth daily.     warfarin 5 MG tablet  Commonly known as:  COUMADIN  Take 2.5-5 mg by mouth daily. 2.5 mg every day except 5 mg on Wed and Sat           Follow-up Information    Follow up with DUDA,MARCUS V, MD In 2 weeks.   Specialty:  Orthopedic Surgery   Contact information:   [address] [place] [address] [phone]       Signed: Nadara Mustard 05/20/2015, 6:26 AM

## 2015-05-20 NOTE — Progress Notes (Signed)
ANTIBIOTIC CONSULT NOTE - FOLLOW-UP  Pharmacy Consult for Vancocin and Zosyn Indication: wound infection   Labs:  Recent Labs  05/17/15 2130 05/19/15 0323 05/20/15 0340  WBC  --  9.0 8.8  HGB 7.2* 6.8* 8.2*  PLT  --  378 374  CREATININE  --  1.03  --    Estimated Creatinine Clearance: 132.4 mL/min (by C-G formula based on Cr of 1.03). No results for input(s): VANCOTROUGH, VANCOPEAK, VANCORANDOM, GENTTROUGH, GENTPEAK, GENTRANDOM, TOBRATROUGH, TOBRAPEAK, TOBRARND, AMIKACINPEAK, AMIKACINTROU, AMIKACIN in the last 72 hours.    Recent Labs (last 2 labs)      Recent Labs  05/17/15 0328  [facility] 10           Microbiology: Recent Results (from the past 720 hour(s))  Culture, blood (routine x 2)     Status: None   Collection Time: 05/15/15  3:54 AM  Result Value Ref Range Status   Specimen Description BLOOD RIGHT ANTECUBITAL  Final   Special Requests BOTTLES DRAWN AEROBIC ONLY 10CC  Final   Culture NO GROWTH 5 DAYS  Final   Report Status 05/20/2015 FINAL  Final  Culture, blood (routine x 2)     Status: None   Collection Time: 05/15/15  4:05 AM  Result Value Ref Range Status   Specimen Description BLOOD RIGHT ANTECUBITAL  Final   Special Requests BOTTLES DRAWN AEROBIC ONLY 10CC  Final   Culture NO GROWTH 5 DAYS  Final   Report Status 05/20/2015 FINAL  Final  Surgical pcr screen     Status: None   Collection Time: 05/16/15  3:41 PM  Result Value Ref Range Status   MRSA, PCR NEGATIVE NEGATIVE Final   Staphylococcus aureus NEGATIVE NEGATIVE Final    Comment:        The Xpert SA Assay (FDA approved for NASAL specimens in patients over 14 years of age), is one component of a comprehensive surveillance program.  Test performance has been validated by [facility] for patients greater than or equal to 75 year old. It is not intended to diagnose infection nor to guide or monitor treatment.      Assessment: 54yo male continues on IV vancomycin and zosyn for  infection RLE stump. Afebrile, WBC wnl. SCr wnl. Blood cultures x2 negative.  S/p I&D/surgical revision by Dr. Lajoyce Corners on 7/18;  Dr. Roda Shutters notes today that per orthopedics, wound vac is to be removed today, patient is cleared to be discharged home with oral doxycycline  -Last Vancomycin trough was 10 mcg/ml on 7/19 on dose 1500 mg IV q12h.  Dose was increased to 1500 mg IV q8h at that time.  Goal of Therapy:  Vancomycin trough level 15-20 mcg/ml  Plan:  Continuing IV vancomycin  IV Q8H   continue Zosyn 3.375g IV Q8H and monitor.  MD notes today that patient is cleared to be discharged home with oral doxycycline   Thank you for allowing pharmacy to be part of this patients care team. Noah Delaine, RPh Clinical Pharmacist Pager: [phone] 05/20/2015,1:45 PM

## 2015-05-20 NOTE — Progress Notes (Signed)
PT Cancellation Note  Patient Details Name: Gabriel Howe MRN: [id number] DOB: [birth year]   Cancelled Treatment:    Reason Eval/Treat Not Completed: Patient declined, no reason specified;Other (comment) (Patient reports being independent, no PT needs. )   Christiane Ha, PT, CSCS Pager [phone] Office [phone]  05/20/2015, 11:07 AM

## 2015-05-20 NOTE — Progress Notes (Signed)
[facility] PROGRESS NOTE  Gabriel Howe ZOX:[id number] DOB: [birth year] DOA: 05/15/2015 PCP: Ardath Sax, MD  Assessment/Plan:  1-infection right LE stump -received IV antibiotics vanc/zosyn, blood culture negative -continue PRN analgesics -I&D/surgical revision by Dr. Lajoyce Corners on 7/18; patient with wound vac in place.  -per orthopedics, wound vac is to be removed today, patient is cleared to be discharged home with oral doxycycline. Prescribed by orthopedics. He is to f/u with ortho in two weeks.  2-uncontrolled diabetes: will  -treated with SSI and lantus -Infection in his stump most likely contributing to CBG's fluctuation -a1c 7, home insulin regimen resumed at discharge by orthopedics -patient is going to f/u with pmd  3-essential HTN: not on medications at home -stable   4-HLD: will continue lipitor  5-leukocytosis  -due to #1 -received IV abx's as recommended by ortho -WBC's trending down/back to WNL  6. Acute blood loss anemia: prbc total of 2units on 7/21, hgb stable at discharge  7. H/o DVT: per patient, DVT right lower extremity after surgery in 10/2014, has been on coumadin, repeat lower extremity ultrasound a week ago, showed no sign of DVT, he states that he has already been scheduled to f/u with pmd /ortho to discuss possible stop anticoagulation. Coumadin was resumed by ortho at discharge.  Code Status: Full Family Communication: no family at bedside Disposition Plan:  home on 7/22, patient declined home health   Consultants:  Dr. Lajoyce Corners  Procedures:  Right stump revision (I&D on 7/18)  prbc transfusion x2units on 7/21  Antibiotics:  vanc  Zosyn   HPI/Subjective: Feeling better, denies pain, no sob. Awaiting wound wac to be removed and awaiting discharge.   Objective: Filed Vitals:   05/20/15 0702  BP: 135/87  Pulse: 73  Temp: 98 F (36.7 C)  Resp: 18    Intake/Output Summary (Last 24 hours) at 05/20/15 0925 Last data filed at  05/20/15 0704  Gross per 24 hour  Intake   1150 ml  Output   4050 ml  Net  -2900 ml   Filed Weights   05/15/15 0200  Weight: 152 kg (335 lb 1.6 oz)    Exam:   General:  Afebrile, denies nausea, vomiting, CP and SOB. Pain on Right stump much better. S/p surgical revision; wound vac in place.   Cardiovascular: S1 and S2, no rubs or gallops, no murmurs   Respiratory: CTA bilaterally   Abdomen: obese, soft, NT, positive BS  Musculoskeletal: bilateral BKA; right stump with wound vac in place; significantly less swollen. tender but much better.   Data Reviewed: Basic Metabolic Panel:  Recent Labs Lab 05/15/15 0354 05/16/15 0533 05/16/15 0942 05/16/15 1755 05/19/15 0323  NA 135 139 137 142 142  K 3.4* 4.6 4.0 4.8 3.7  CL 99* 108 108  --  111  CO2 24 23 23   --  28  GLUCOSE 229* 227* 271* 199* 139*  BUN 14 12 10   --  6  CREATININE 1.04 0.93 1.02  --  1.03  CALCIUM 8.1* 8.1* 8.1*  --  8.3*   Liver Function Tests:  Recent Labs Lab 05/16/15 0942  AST 26  ALT 20  ALKPHOS 65  BILITOT 0.6  PROT 6.4*  ALBUMIN 1.9*   CBC:  Recent Labs Lab 05/16/15 0942  05/16/15 1905 05/17/15 0328 05/17/15 2130 05/19/15 0323 05/20/15 0340  WBC 9.2  --  11.3* 11.3*  --  9.0 8.8  NEUTROABS 6.4  --   --   --   --   --   --  HGB 10.8*  < > 8.0* 7.3* 7.2* 6.8* 8.2*  HCT 33.2*  < > 25.2* 23.2* 22.4* 21.2* 25.4*  MCV 83.2  --  83.7 84.4  --  84.5 82.2  PLT 355  --  360 336  --  378 374  < > = values in this interval not displayed. CBG:  Recent Labs Lab 05/19/15 1619 05/19/15 1956 05/20/15 0109 05/20/15 0406 05/20/15 0900  GLUCAP 199* 214* 130* 117* 140*    Recent Results (from the past 240 hour(s))  Culture, blood (routine x 2)     Status: None (Preliminary result)   Collection Time: 05/15/15  3:54 AM  Result Value Ref Range Status   Specimen Description BLOOD RIGHT ANTECUBITAL  Final   Special Requests BOTTLES DRAWN AEROBIC ONLY 10CC  Final   Culture NO GROWTH 4  DAYS  Final   Report Status PENDING  Incomplete  Culture, blood (routine x 2)     Status: None (Preliminary result)   Collection Time: 05/15/15  4:05 AM  Result Value Ref Range Status   Specimen Description BLOOD RIGHT ANTECUBITAL  Final   Special Requests BOTTLES DRAWN AEROBIC ONLY 10CC  Final   Culture NO GROWTH 4 DAYS  Final   Report Status PENDING  Incomplete  Surgical pcr screen     Status: None   Collection Time: 05/16/15  3:41 PM  Result Value Ref Range Status   MRSA, PCR NEGATIVE NEGATIVE Final   Staphylococcus aureus NEGATIVE NEGATIVE Final    Comment:        The Xpert SA Assay (FDA approved for NASAL specimens in patients over 77 years of age), is one component of a comprehensive surveillance program.  Test performance has been validated by [facility] for patients greater than or equal to 53 year old. It is not intended to diagnose infection nor to guide or monitor treatment.      Studies: No results found.  Scheduled Meds: . sodium chloride   Intravenous Once  . sodium chloride   Intravenous Once  . atorvastatin  10 mg Oral QHS  . insulin aspart  0-9 Units Subcutaneous 6 times per day  . insulin glargine  30 Units Subcutaneous BID  . piperacillin-tazobactam (ZOSYN)  IV  3.375 g Intravenous Q8H  . vancomycin  1,500 mg Intravenous Q8H   Continuous Infusions:    Principal Problem:   Infection of amputation stump of right lower extremity Active Problems:   DM2 (diabetes mellitus, type 2)   S/P bilateral BKA (below knee amputation)   Infection of below knee amputation stump   Wound infection    Time spent: 35 minutes    [name]  [facility] Pager [phone]. If 7PM-7AM, please contact night-coverage at [email or web address], password [facility] 05/20/2015, 9:25 AM  LOS: 5 days

## 2015-05-21 LAB — ABO/RH: ABO/RH(D): B POS

## 2016-08-22 ENCOUNTER — Telehealth (INDEPENDENT_AMBULATORY_CARE_PROVIDER_SITE_OTHER): Payer: Self-pay | Admitting: Radiology

## 2016-08-22 ENCOUNTER — Ambulatory Visit (INDEPENDENT_AMBULATORY_CARE_PROVIDER_SITE_OTHER): Payer: Medicare Other | Admitting: Family

## 2016-08-22 ENCOUNTER — Encounter (INDEPENDENT_AMBULATORY_CARE_PROVIDER_SITE_OTHER): Payer: Self-pay | Admitting: Family

## 2016-08-22 ENCOUNTER — Ambulatory Visit (INDEPENDENT_AMBULATORY_CARE_PROVIDER_SITE_OTHER): Payer: Self-pay | Admitting: Orthopedic Surgery

## 2016-08-22 VITALS — Ht 76.0 in | Wt 325.0 lb

## 2016-08-22 DIAGNOSIS — L03115 Cellulitis of right lower limb: Secondary | ICD-10-CM

## 2016-08-22 DIAGNOSIS — L02416 Cutaneous abscess of left lower limb: Secondary | ICD-10-CM | POA: Diagnosis not present

## 2016-08-22 DIAGNOSIS — Z89511 Acquired absence of right leg below knee: Secondary | ICD-10-CM

## 2016-08-22 DIAGNOSIS — Z89512 Acquired absence of left leg below knee: Secondary | ICD-10-CM | POA: Diagnosis not present

## 2016-08-22 MED ORDER — CLINDAMYCIN HCL 150 MG PO CAPS: 150.0000 mg | ORAL_CAPSULE | Freq: Four times a day (QID) | ORAL | 0 refills | Status: DC

## 2016-08-22 NOTE — Telephone Encounter (Signed)
Patient called back office, and he is able to come in at 230pm today, made appt with Denny PeonErin NP.

## 2016-08-22 NOTE — Telephone Encounter (Signed)
Patient called triage line today stating the beginning of this week he was feeling fine. But yesterday he was in severe pain. He could not walk, he is bilateral bka. He found blood in both liners yesterday evening. He is worried about infection, he has had fever and chills. Advised him to come in office today to see Denny PeonErin NP, Dr. Lajoyce Cornersuda is operating for the entire day today. He is going to call the office back, when he sorts out transportation.

## 2016-08-22 NOTE — Progress Notes (Signed)
Wound Care Note   Patient: Gabriel Howe           Date of Birth: 02/06/1961           MRN: [id number]             PCP: Pcp Not In System Visit Date: 08/22/2016   Assessment & Plan: Visit Diagnoses:  1. Abscess of left leg   2. Cellulitis of right leg without foot   3. S/P BKA (below knee amputation) bilateral (HCC)     Plan: Have sent wound cultures. will start him on a course of clindamycin. Cleanse wounds daily. Iodosorb dressings daily. Minimize weightbearing in prostheses.  Follow-Up Instructions: Return in about 2 weeks (around 09/05/2016).  Orders:  Orders Placed This Encounter  Procedures  . Wound culture   Meds ordered this encounter  Medications  . clindamycin (CLEOCIN) 150 MG capsule    Sig: Take 1 capsule (150 mg total) by mouth 4 (four) times daily.    Dispense:  42 capsule    Refill:  0      Procedures: No notes on file   Clinical Data: No additional findings.   No images are attached to the encounter.   Subjective: Chief Complaint  Patient presents with  . Left Knee - Open Wound    Transtibial amputation  . Right Knee - Open Wound    Transtibial amputation  . Wound Check    questionable transtibial amputation infection with ulcerations bilaterally    Patient presents in office today with bilateral transtibial amputations with questionable infection. Patient has ulceration bilateral residual limbs. Left being worse than the right. He states yesterday evening he had a fever, chills, sweats, and shakes. He is fatigued today and feels he has no energy for regular activities of daily living. He is concerned he has an infection. There is minimal redness, no swelling. He has taken clindamycin 300 mg capsules tid qid x 14 days in the past. He is going to see his prosthetist [facility] [place] later today. He has been applying iodosorb dressing lateral right transtibial wound, and no dressing to left anterior transtibial wound. He is only  wearing prosthetic limb on the right.    Review of Systems  Constitutional: Positive for chills. Negative for appetite change and fever.    Miscellaneous:  -Home Health Care: no  -Physical Therapy: no  Objective: Vital Signs: Ht 6\' 4"  (1.93 m)   Wt (!) 325 lb (147.4 kg)   BMI 39.56 kg/m   Physical Exam: Left residual limb is well consolidated. Minimal swelling. There is a 1 cm callused area overlying fluctuance. Serosanguineous drainage expressed this was cultured. No purulence. There is surrounding erythema and tenderness. Right residual limb well healed well consolidated. Area of previous infection is well healed. Laterally over the fibular head there is a 15 mm in diameter ulceration. This is tender. There is granulation tissue in the wound bed and surrounding erythema. No purulence or ascending cellulitis.  Specialty Comments: No specialty comments available.   PMFS History: Patient Active Problem List   Diagnosis Date Noted  . S/P bilateral BKA (below knee amputation) (HCC) 05/15/2015  . Infection of below knee amputation stump (HCC) 05/15/2015  . Infection of amputation stump of right lower extremity (HCC) 05/15/2015  . Wound infection 05/15/2015  . Diabetic osteomyelitis (HCC) 08/05/2013  . Cellulitis and abscess of toe of left foot 08/01/2013  . Hyponatremia 08/01/2013  . Hypokalemia 08/01/2013  . Sepsis associated hypotension (HCC)  08/01/2013  . Leukocytosis 08/01/2013  . Cellulitis and abscess of lower leg 12/17/2012  . DM foot ulcer (HCC) 12/17/2012  . DM2 (diabetes mellitus, type 2) (HCC) 12/17/2012  . Diabetic neuropathy (HCC) 12/17/2012   Past Medical History:  Diagnosis Date  . Arthritis    "ankles and feet" (08/19/2013)  . Bronchitis    hx of;last time about 7yrs ago  . Chronic back pain   . Exertional shortness of breath   . WUJWJXBJ(478.2)    "monthly" (08/19/2013)  . Heart murmur    Hx: of as a child  . Hyperlipidemia    takes Atorvastatin  daily  . Neuromuscular disorder (HCC)    diabetic neuropathy in feet  . Type II diabetes mellitus (HCC)    takes Metformin bid and Lantus nightly    Family History  Problem Relation Age of Onset  . Other Mother   . Other Father    Past Surgical History:  Procedure Laterality Date  . AMPUTATION Right 01/02/2013   Procedure: AMPUTATION BELOW KNEE;  Surgeon: Nadara Mustard, MD;  Location: [facility] OR;  Service: Orthopedics;  Laterality: Right;  Right Below Knee Amputation  . AMPUTATION Left 04/25/2013   Procedure: AMPUTATION RAY;  Surgeon: Nadara Mustard, MD;  Location: [facility] OR;  Service: Orthopedics;  Laterality: Left;  Left Foot 3rd Ray Amputation  . AMPUTATION Left 08/03/2013   Procedure: Left Midfoot Amputation;  Surgeon: Nadara Mustard, MD;  Location: [facility] OR;  Service: Orthopedics;  Laterality: Left;  Left Foot Transmetatarsal Amputation  . AMPUTATION Left 08/19/2013   Procedure: AMPUTATION BELOW KNEE- left;  Surgeon: Nadara Mustard, MD;  Location: [facility] OR;  Service: Orthopedics;  Laterality: Left;  Left Below Knee Amputation  . AMPUTATION Right 05/16/2015   Procedure: AMPUTATION BELOW KNEE/REVISION;  Surgeon: Nadara Mustard, MD;  Location: [facility] OR;  Service: Orthopedics;  Laterality: Right;  . BELOW KNEE LEG AMPUTATION Left 08/19/2013  . BELOW KNEE LEG AMPUTATION Right 12/2012  . ESOPHAGOGASTRODUODENOSCOPY    . STUMP REVISION Right 05/16/2015  . TOE AMPUTATION Right 2012   at [facility], and removal of partial of second to 2012  . TONSILLECTOMY     Social History   Occupational History  . Not on file.   Social History Main Topics  . Smoking status: Former Smoker    Years: 2.00    Types: Cigarettes  . Smokeless tobacco: Never Used     Comment: 08/19/2013 "quit 63yrs ago"  . Alcohol use Yes     Comment: 08/19/2013 "couple drinks maybe once/month" 04/2015 social   . Drug use:      Comment: 08/19/2013 "recreational drugs 30 yr ago"  . Sexual activity: Not Currently

## 2016-08-23 ENCOUNTER — Telehealth (INDEPENDENT_AMBULATORY_CARE_PROVIDER_SITE_OTHER): Payer: Self-pay

## 2016-08-23 ENCOUNTER — Telehealth (INDEPENDENT_AMBULATORY_CARE_PROVIDER_SITE_OTHER): Payer: Self-pay | Admitting: Orthopedic Surgery

## 2016-08-23 MED ORDER — CLINDAMYCIN HCL 150 MG PO CAPS: 150.0000 mg | ORAL_CAPSULE | Freq: Four times a day (QID) | ORAL | 0 refills | Status: DC

## 2016-08-23 NOTE — Telephone Encounter (Signed)
Lab called about the wound culture that was obtained yesterday stating that the collection tube was out of date. Called to verify the date we have on specimen collection tube. She left message on voice mail asking for a return phone call back

## 2016-08-23 NOTE — Telephone Encounter (Signed)
Patient has a question regarding his RX.  Contact Info: [phone]

## 2016-08-23 NOTE — Telephone Encounter (Signed)
I called spoke with patient he states in prior rx he was taking clindamycin 2 150mg  capsules 4 times daily. Per Denny PeonErin NP ok to send in additional refill since patient would be out before coming in the office in two weeks. Patient aware and medication was reordered and [facility] [place].

## 2016-08-23 NOTE — Telephone Encounter (Signed)
I called and sw lab and advised that yes we checked the collection tube and the exp date was old. I have disposed of the out of date swabs and per Denny PeonErin we will not call pt to come back will address when he comes in next week.

## 2016-09-05 ENCOUNTER — Other Ambulatory Visit (INDEPENDENT_AMBULATORY_CARE_PROVIDER_SITE_OTHER): Payer: Self-pay | Admitting: Family

## 2016-09-05 ENCOUNTER — Ambulatory Visit (INDEPENDENT_AMBULATORY_CARE_PROVIDER_SITE_OTHER): Payer: Medicare Other | Admitting: Orthopedic Surgery

## 2016-09-05 DIAGNOSIS — Z89512 Acquired absence of left leg below knee: Secondary | ICD-10-CM | POA: Diagnosis not present

## 2016-09-05 DIAGNOSIS — Z89511 Acquired absence of right leg below knee: Secondary | ICD-10-CM

## 2016-09-05 DIAGNOSIS — L97201 Non-pressure chronic ulcer of unspecified calf limited to breakdown of skin: Secondary | ICD-10-CM | POA: Diagnosis not present

## 2016-09-05 MED ORDER — CADEXOMER IODINE 0.9 % EX GEL: Freq: Every day | CUTANEOUS | Status: DC | PRN

## 2016-09-05 MED ORDER — CADEXOMER IODINE 0.9 % EX GEL: 1.0000 "application " | Freq: Every day | CUTANEOUS | 0 refills | Status: DC | PRN

## 2016-09-05 NOTE — Progress Notes (Signed)
Wound Care Note   Patient: Gabriel Howe           Date of Birth: 1961-03-18           MRN: [id number]             PCP: Pcp Not In System Visit Date: 09/05/2016   Assessment & Plan: Visit Diagnoses:  1. S/P bilateral BKA (below knee amputation) (HCC)   2. Non-pressure chronic ulcer of calf, unspecified laterality, limited to breakdown of skin [facility](HCC)     Plan: [place] [facility] [place] new sockets. He is sent in a prescription for iron's or to be applied to the wounds on both legs change daily his wounds are looking much better.   Follow-Up Instructions: Return in about 4 weeks (around 10/03/2016).  Orders:  No orders of the defined types were placed in this encounter.  Meds ordered this encounter  Medications  . cadexomer iodine (IODOSORB) 0.9 % gel      Procedures: No notes on file   Clinical Data: No additional findings.   No images are attached to the encounter.   Subjective: Chief Complaint  Patient presents with  . Right Leg - Wound Check    Lateral side open area transtibial amputation  . Left Leg - Follow-up    Transtibial amputation left    Patient is using iodosorb dressing to the lateral side of the transtibial amputation on the right. Small pes sized are with rolled edge and maceration . Small amount of pink drainage. The pt will complete his bx this evening.   Wound Check   On examination patient is alert oriented no adenopathy well-dressed normal affect normal respiratory effort he does have an antalgic gait uses a cane. Examination of both legs he has a small superficial ulcer over the fibular head laterally on the right this was debrided of fibrinous exudative tissue is basically 100% beefy granulation tissue at this time approximately 10 mm in diameter 0.1 mm deep. He has a very superficial area over the residual limb distally on the right and this has no open ulcer. Examination the left leg he has a  residual ulcer over the distal tibia on the left this is also healing quite nicely and this is 5 mm in diameter 0.1 mm deep with good granulation tissue neither ulcer has redness cellulitis drainage or odor no signs of infection.  Review of Systems  Miscellaneous:  -Home Health Care: N/A  -Physical Therapy: N/A  -Out of Work?: N/A  -Worker's Compensation Case?: N/A  -Additional Information: N/A   Objective: Vital Signs: There were no vitals taken for this visit.  Physical Exam: See wound evaluation  Specialty Comments: No specialty comments available.   PMFS History: Patient Active Problem List   Diagnosis Date Noted  . S/P bilateral BKA (below knee amputation) (HCC) 05/15/2015  . Infection of below knee amputation stump (HCC) 05/15/2015  . Infection of amputation stump of right lower extremity (HCC) 05/15/2015  . Wound infection 05/15/2015  . Diabetic osteomyelitis (HCC) 08/05/2013  . Cellulitis and abscess of toe of left foot 08/01/2013  . Hyponatremia 08/01/2013  . Hypokalemia 08/01/2013  . Sepsis associated hypotension (HCC) 08/01/2013  . Leukocytosis 08/01/2013  . Cellulitis and abscess of lower leg 12/17/2012  . DM foot ulcer (HCC) 12/17/2012  . DM2 (diabetes mellitus, type 2) (HCC) 12/17/2012  . Diabetic neuropathy (HCC) 12/17/2012   Past Medical History:  Diagnosis Date  . Arthritis    "  ankles and feet" (08/19/2013)  . Bronchitis    hx of;last time about 5756yrs ago  . Chronic back pain   . Exertional shortness of breath   . WUJWJXBJ(478.2Headache(784.0)    "monthly" (08/19/2013)  . Heart murmur    Hx: of as a child  . Hyperlipidemia    takes Atorvastatin daily  . Neuromuscular disorder (HCC)    diabetic neuropathy in feet  . Type II diabetes mellitus (HCC)    takes Metformin bid and Lantus nightly    Family History  Problem Relation Age of Onset  . Other Mother   . Other Father    Past Surgical History:  Procedure Laterality Date  . AMPUTATION Right 01/02/2013    Procedure: AMPUTATION BELOW KNEE;  Surgeon: Nadara MustardMarcus V Kyanne Rials, MD;  Location: [facility] OR;  Service: Orthopedics;  Laterality: Right;  Right Below Knee Amputation  . AMPUTATION Left 04/25/2013   Procedure: AMPUTATION RAY;  Surgeon: Nadara MustardMarcus V Tyarra Nolton, MD;  [facility];  Service: Orthopedics;  Laterality: Left;  Left Foot 3rd Ray Amputation  . AMPUTATION Left 08/03/2013   Procedure: Left Midfoot Amputation;  Surgeon: Nadara MustardMarcus V Derward Marple, MD;  [facility];  Service: Orthopedics;  Laterality: Left;  Left Foot Transmetatarsal Amputation  . AMPUTATION Left 08/19/2013   Procedure: AMPUTATION BELOW KNEE- left;  Surgeon: Nadara MustardMarcus V Reymundo Winship, MD;  Location: [facility] OR;  Service: Orthopedics;  Laterality: Left;  Left Below Knee Amputation  . AMPUTATION Right 05/16/2015   Procedure: AMPUTATION BELOW KNEE/REVISION;  Surgeon: Nadara MustardMarcus Thurl Boen V, MD;  Location: [facility] OR;  Service: Orthopedics;  Laterality: Right;  . BELOW KNEE LEG AMPUTATION Left 08/19/2013  . BELOW KNEE LEG AMPUTATION Right 12/2012  . ESOPHAGOGASTRODUODENOSCOPY    . STUMP REVISION Right 05/16/2015  . TOE AMPUTATION Right 2012   at [facility], and removal of partial of second to 2012  . TONSILLECTOMY     Social History   Occupational History  . Not on file.   Social History Main Topics  . Smoking status: Former Smoker    Years: 2.00    Types: Cigarettes  . Smokeless tobacco: Never Used     Comment: 08/19/2013 "quit 1423yrs ago"  . Alcohol use Yes     Comment: 08/19/2013 "couple drinks maybe once/month" 04/2015 social   . Drug use:      Comment: 08/19/2013 "recreational drugs 30 yr ago"  . Sexual activity: Not Currently

## 2016-09-05 NOTE — Addendum Note (Signed)
Addended by: Aldean BakerUDA, Jakyri Brunkhorst on: 09/05/2016 05:07 PM   Modules accepted: Orders

## 2016-09-06 ENCOUNTER — Telehealth (INDEPENDENT_AMBULATORY_CARE_PROVIDER_SITE_OTHER): Payer: Self-pay | Admitting: Orthopedic Surgery

## 2016-09-06 MED ORDER — CADEXOMER IODINE 0.9 % EX GEL: 1.0000 "application " | Freq: Every day | CUTANEOUS | 0 refills | Status: DC | PRN

## 2016-09-06 NOTE — Telephone Encounter (Signed)
Pt states he's having a problem with the Rx he was given on 09/05/16.

## 2016-09-06 NOTE — Telephone Encounter (Signed)
I called pharmacy [facility] and they cannot initiate prior authorization until patient comes in.

## 2016-09-06 NOTE — Telephone Encounter (Signed)
PT. Called stating he needs his medication sent to different pharmacy. He is in [place] now and wants it sent to [facility].

## 2016-09-06 NOTE — Telephone Encounter (Signed)
Sent to new pharmacy but medication requires prior authorization. Autumn will be working on this.

## 2016-09-07 ENCOUNTER — Other Ambulatory Visit (INDEPENDENT_AMBULATORY_CARE_PROVIDER_SITE_OTHER): Payer: Self-pay | Admitting: Orthopedic Surgery

## 2016-09-07 NOTE — Telephone Encounter (Signed)
[facility] called wanting to know if a PA had been received for this patient.  Contact Info: [phone]

## 2016-09-07 NOTE — Telephone Encounter (Signed)
Patient called left voicemail message to call in the secondary Rx to the pharmacy [place].  The number to contact him is [phone]

## 2016-09-07 NOTE — Telephone Encounter (Signed)
I [facility] this [facility], medication does require prior authorization. I initiated this over the phone and [facility] claim. This was denied she states that iodosorb is considered a surgical application. She states that no matter what clinical information was given this would still be denied by medicare there is no work around for this. The santyl ointment is 200 out of pocket but covered by insurance, but the cost is still approximately 87.81. The iodosorb out of pocket is 100. I left a voicemail for the patient asking him to please call back and let us know what he wants to do.

## 2016-09-10 MED ORDER — COLLAGENASE 250 UNIT/GM EX OINT: 1.0000 "application " | TOPICAL_OINTMENT | Freq: Every day | CUTANEOUS | 0 refills | Status: DC

## 2016-09-10 NOTE — Telephone Encounter (Signed)
I called and left voicemail for patient to advise that [facility]antyl rx was sent into [facility] in [place]. 

## 2016-09-10 NOTE — Telephone Encounter (Signed)
I called to advise patient rx for santyl called into [facility] in [place]

## 2016-09-10 NOTE — Telephone Encounter (Signed)
I called and left voicemail for patient to advise that [facility]yl rx was sent into [facility] [place].

## 2016-10-03 ENCOUNTER — Ambulatory Visit (INDEPENDENT_AMBULATORY_CARE_PROVIDER_SITE_OTHER): Payer: Medicare Other | Admitting: Orthopedic Surgery

## 2016-10-03 DIAGNOSIS — Z89512 Acquired absence of left leg below knee: Secondary | ICD-10-CM

## 2016-10-03 DIAGNOSIS — L97211 Non-pressure chronic ulcer of right calf limited to breakdown of skin: Secondary | ICD-10-CM | POA: Diagnosis not present

## 2016-10-03 DIAGNOSIS — Z89511 Acquired absence of right leg below knee: Secondary | ICD-10-CM

## 2016-10-03 NOTE — Progress Notes (Signed)
Wound Care Note   Patient: Gabriel Howe           Date of Birth: 1961/10/03           MRN: [id number]             PCP: Pcp Not In System Visit Date: 10/03/2016   Assessment & Plan: Visit Diagnoses:  1. Status post bilateral below knee amputation (HCC)   2. Non-pressure chronic ulcer of right calf, limited to breakdown of skin (HCC)     Plan: Patient was given felt  donuts 2 applied to the ulcer fibular head on the right. The ulcer on the left transtibial amputation has healed nicely. Patient will [facility] further to his socket.   Follow-Up Instructions: Return in about 4 weeks (around 10/31/2016).  Orders:  No orders of the defined types were placed in this encounter.  No orders of the defined types were placed in this encounter.     Procedures: No notes on file   Clinical Data: No additional findings.   No images are attached to the encounter.   Subjective: Chief Complaint  Patient presents with  . Right Knee - Wound Check  . Left Knee - Wound Check    Patient is here for bilateral transtibial amputation wounds. Right lateral wound. He is applying iodosorb dressing changes daily. He has appointment with Alinda Moneyony from [facility] tomorrow for prosthetic adjustment. He denies any pain. There is no drainage.   Wound Check   Examination patient is alert oriented no adenopathy well-dressed normal affect normal S2 effort he does have an antalgic gait examination the left transtibial amputation the ulcer on the residual limb on the left is healed nicely he will stop using the Iodosorb dressing changes on the right side patient has persistent ulcer over the fibular head on the right. Patient states that the socket has been cut away at this location however there is still pressure. The ulcers 15 mm in diameter 0.1 mm deep has good beefy granulation tissue there is no cellulitis no drainage no odor no signs of infection.  Review of  Systems     Objective: Vital Signs: There were no vitals taken for this visit.  Physical Exam: see wound check above  Specialty Comments: No specialty comments available.   PMFS History: Patient Active Problem List   Diagnosis Date Noted  . Non-pressure chronic ulcer of right calf, limited to breakdown of skin (HCC) 10/03/2016  . Status post bilateral below knee amputation (HCC) 05/15/2015  . Infection of below knee amputation stump (HCC) 05/15/2015  . Infection of amputation stump of right lower extremity (HCC) 05/15/2015  . Wound infection 05/15/2015  . Diabetic osteomyelitis (HCC) 08/05/2013  . Cellulitis and abscess of toe of left foot 08/01/2013  . Hyponatremia 08/01/2013  . Hypokalemia 08/01/2013  . Sepsis associated hypotension (HCC) 08/01/2013  . Leukocytosis 08/01/2013  . Cellulitis and abscess of lower leg 12/17/2012  . DM foot ulcer (HCC) 12/17/2012  . DM2 (diabetes mellitus, type 2) (HCC) 12/17/2012  . Diabetic neuropathy (HCC) 12/17/2012   Past Medical History:  Diagnosis Date  . Arthritis    "ankles and feet" (08/19/2013)  . Bronchitis    hx of;last time about 3765yrs ago  . Chronic back pain   . Exertional shortness of breath   . WUJWJXBJ(478.2Headache(784.0)    "monthly" (08/19/2013)  . Heart murmur    Hx: of as a child  . Hyperlipidemia    takes  Atorvastatin daily  . Neuromuscular disorder (HCC)    diabetic neuropathy in feet  . Type II diabetes mellitus (HCC)    takes Metformin bid and Lantus nightly    Family History  Problem Relation Age of Onset  . Other Mother   . Other Father    Past Surgical History:  Procedure Laterality Date  . AMPUTATION Right 01/02/2013   Procedure: AMPUTATION BELOW KNEE;  Surgeon: Nadara MustardMarcus V Amsi Grimley, MD;  Location: [facility] OR;  Service: Orthopedics;  Laterality: Right;  Right Below Knee Amputation  . AMPUTATION Left 04/25/2013   Procedure: AMPUTATION RAY;  Surgeon: Nadara MustardMarcus V Tangela Dolliver, MD;  [facility];  Service: Orthopedics;  Laterality:  Left;  Left Foot 3rd Ray Amputation  . AMPUTATION Left 08/03/2013   Procedure: Left Midfoot Amputation;  Surgeon: Nadara MustardMarcus V Tyreek Clabo, MD;  [facility];  Service: Orthopedics;  Laterality: Left;  Left Foot Transmetatarsal Amputation  . AMPUTATION Left 08/19/2013   Procedure: AMPUTATION BELOW KNEE- left;  Surgeon: Nadara MustardMarcus V Yessica Putnam, MD;  Location: [facility] OR;  Service: Orthopedics;  Laterality: Left;  Left Below Knee Amputation  . AMPUTATION Right 05/16/2015   Procedure: AMPUTATION BELOW KNEE/REVISION;  Surgeon: Nadara MustardMarcus Mili Piltz V, MD;  Location: [facility] OR;  Service: Orthopedics;  Laterality: Right;  . BELOW KNEE LEG AMPUTATION Left 08/19/2013  . BELOW KNEE LEG AMPUTATION Right 12/2012  . ESOPHAGOGASTRODUODENOSCOPY    . STUMP REVISION Right 05/16/2015  . TOE AMPUTATION Right 2012   at [facility], and removal of partial of second to 2012  . TONSILLECTOMY     Social History   Occupational History  . Not on file.   Social History Main Topics  . Smoking status: Former Smoker    Years: 2.00    Types: Cigarettes  . Smokeless tobacco: Never Used     Comment: 08/19/2013 "quit 5069yrs ago"  . Alcohol use Yes     Comment: 08/19/2013 "couple drinks maybe once/month" 04/2015 social   . Drug use:      Comment: 08/19/2013 "recreational drugs 30 yr ago"  . Sexual activity: Not Currently

## 2016-11-07 ENCOUNTER — Ambulatory Visit (INDEPENDENT_AMBULATORY_CARE_PROVIDER_SITE_OTHER): Payer: Medicare Other | Admitting: Family

## 2016-11-07 ENCOUNTER — Encounter (INDEPENDENT_AMBULATORY_CARE_PROVIDER_SITE_OTHER): Payer: Self-pay | Admitting: Family

## 2016-11-07 ENCOUNTER — Ambulatory Visit (INDEPENDENT_AMBULATORY_CARE_PROVIDER_SITE_OTHER): Payer: Medicare Other | Admitting: Orthopedic Surgery

## 2016-11-07 DIAGNOSIS — Z89512 Acquired absence of left leg below knee: Secondary | ICD-10-CM

## 2016-11-07 DIAGNOSIS — Z89511 Acquired absence of right leg below knee: Secondary | ICD-10-CM

## 2016-11-07 NOTE — Progress Notes (Signed)
Office Visit Note   Patient: Gabriel Howe           Date of Birth: 03-13-1961           MRN: [id number] Visit Date: 11/07/2016              Requested by: No referring provider defined for this encounter. PCP: Pcp Not In System  Chief Complaint  Patient presents with  . Right Knee - Wound Check    Lateral wound right below knee amputation    HPI: Patient is a 56 year old gentleman seen today for evaluation for four week follow up for ulcer of fibular head right transtibial amputation.  He is using iodosorb ointment over wound. There is no odor, no redness, minimal maceration.   He was wearing smaller prosthetic with felt relieving donut but has since switched to his original larger prosthetic. He states he no longer has pain with prosthetic wear. He has an appointment this afternoon with Alinda Moneyony from [facility] [place].He is wearing 15 and 20 ply with prosthetics today. Has been rotating different prosthetics and ply due to ill fitting sockets and ulcerations.     Assessment & Plan: Visit Diagnoses:  1. Status post bilateral below knee amputation (HCC)     Plan: Continue with iodosorb dressing changes daily following him wound cleansing. Continue minimizing weightbearing in the right prosthetic. Will call or turn with any concerns for infection. Follow-up in 4 more weeks. Following with his prosthesis later today for new socket.  Follow-Up Instructions: Return in about 4 weeks (around 12/05/2016).   Ortho Exam Him left residual limb is well healed well consolidated. There are no open areas no erythema drainage no sign of infection. The right residual limb has ulceration over the fibular head. This measures about 15 x7 mm. filled in with beefy granulation tissue. There is no drainage no surrounding erythema or sign of infection. There was some surrounding callus which was part with a 10 blade knife back to viable tissue.  Imaging: No results found.  Orders:  No orders of the  defined types were placed in this encounter.  No orders of the defined types were placed in this encounter.    Procedures: No procedures performed  Clinical Data: No additional findings.  Subjective: Review of Systems  Constitutional: Negative for chills and fever.  Skin: Positive for wound.    Objective: Vital Signs: There were no vitals taken for this visit.  Specialty Comments:  No specialty comments available.  PMFS History: Patient Active Problem List   Diagnosis Date Noted  . Non-pressure chronic ulcer of right calf, limited to breakdown of skin (HCC) 10/03/2016  . Status post bilateral below knee amputation (HCC) 05/15/2015  . Infection of below knee amputation stump (HCC) 05/15/2015  . Infection of amputation stump of right lower extremity (HCC) 05/15/2015  . Wound infection 05/15/2015  . Diabetic osteomyelitis (HCC) 08/05/2013  . Cellulitis and abscess of toe of left foot 08/01/2013  . Hyponatremia 08/01/2013  . Hypokalemia 08/01/2013  . Sepsis associated hypotension (HCC) 08/01/2013  . Leukocytosis 08/01/2013  . Cellulitis and abscess of lower leg 12/17/2012  . DM foot ulcer (HCC) 12/17/2012  . DM2 (diabetes mellitus, type 2) (HCC) 12/17/2012  . Diabetic neuropathy (HCC) 12/17/2012   Past Medical History:  Diagnosis Date  . Arthritis    "ankles and feet" (08/19/2013)  . Bronchitis    hx of;last time about 7051yrs ago  . Chronic back pain   . Exertional  shortness of breath   . VCBSWHQP(591.6)    "monthly" (08/19/2013)  . Heart murmur    Hx: of as a child  . Hyperlipidemia    takes Atorvastatin daily  . Neuromuscular disorder (HCC)    diabetic neuropathy in feet  . Type II diabetes mellitus (HCC)    takes Metformin bid and Lantus nightly    Family History  Problem Relation Age of Onset  . Other Mother   . Other Father     Past Surgical History:  Procedure Laterality Date  . AMPUTATION Right 01/02/2013   Procedure: AMPUTATION BELOW KNEE;   Surgeon: Nadara Mustard, MD;  Location: [facility] OR;  Service: Orthopedics;  Laterality: Right;  Right Below Knee Amputation  . AMPUTATION Left 04/25/2013   Procedure: AMPUTATION RAY;  Surgeon: Nadara Mustard, MD;  Location[facility] OR;  Service: Orthopedics;  Laterality: Left;  Left Foot 3rd Ray Amputation  . AMPUTATION Left 08/03/2013   Procedure: Left Midfoot Amputation;  Surgeon: Nadara Mustard, MD;  Location: [facility] OR;  Service: Orthopedics;  Laterality: Left;  Left Foot Transmetatarsal Amputation  . AMPUTATION Left 08/19/2013   Procedure: AMPUTATION BELOW KNEE- left;  Surgeon: Nadara Mustard, MD;  Location: [facility] OR;  Service: Orthopedics;  Laterality: Left;  Left Below Knee Amputation  . AMPUTATION Right 05/16/2015   Procedure: AMPUTATION BELOW KNEE/REVISION;  Surgeon: Nadara Mustard, MD;  Location: [facility] OR;  Service: Orthopedics;  Laterality: Right;  . BELOW KNEE LEG AMPUTATION Left 08/19/2013  . BELOW KNEE LEG AMPUTATION Right 12/2012  . ESOPHAGOGASTRODUODENOSCOPY    . STUMP REVISION Right 05/16/2015  . TOE AMPUTATION Right 2012   at [facility], and removal of partial of second to 2012  . TONSILLECTOMY     Social History   Occupational History  . Not on file.   Social History Main Topics  . Smoking status: Former Smoker    Years: 2.00    Types: Cigarettes  . Smokeless tobacco: Never Used     Comment: 08/19/2013 "quit 13yrs ago"  . Alcohol use Yes     Comment: 08/19/2013 "couple drinks maybe once/month" 04/2015 social   . Drug use:      Comment: 08/19/2013 "recreational drugs 30 yr ago"  . Sexual activity: Not Currently

## 2016-12-07 ENCOUNTER — Ambulatory Visit (INDEPENDENT_AMBULATORY_CARE_PROVIDER_SITE_OTHER): Payer: Medicare Other | Admitting: Family

## 2016-12-07 ENCOUNTER — Ambulatory Visit (INDEPENDENT_AMBULATORY_CARE_PROVIDER_SITE_OTHER): Payer: Medicare Other | Admitting: Orthopedic Surgery

## 2017-11-18 ENCOUNTER — Telehealth (INDEPENDENT_AMBULATORY_CARE_PROVIDER_SITE_OTHER): Payer: Self-pay | Admitting: Orthopedic Surgery

## 2017-11-18 NOTE — Telephone Encounter (Signed)
The fax should have attention Dawn

## 2017-11-18 NOTE — Telephone Encounter (Signed)
Patient called stating that he needs new liners for his prosthetics.  He would like the prescription sent to [facility] in [place].  Their fax # is [phone].  Patient's CB#[phone].  Thank you.

## 2017-11-22 ENCOUNTER — Other Ambulatory Visit (INDEPENDENT_AMBULATORY_CARE_PROVIDER_SITE_OTHER): Payer: Self-pay

## 2017-11-22 NOTE — Telephone Encounter (Signed)
This has been done.

## 2017-11-22 NOTE — Telephone Encounter (Signed)
Patient called to make sure this order was sent, he would like a call once this is complete and he would like it done today if possible.

## 2018-05-21 ENCOUNTER — Telehealth (INDEPENDENT_AMBULATORY_CARE_PROVIDER_SITE_OTHER): Payer: Self-pay | Admitting: Orthopedic Surgery

## 2018-05-21 NOTE — Telephone Encounter (Signed)
Patient called to state needed RX for Prosthetic supplies(liners)  Please call patient to advise. [phone]  RX can be faxed to [facility]: [phone]

## 2018-05-21 NOTE — Telephone Encounter (Signed)
Please advise on RX? 

## 2018-05-22 NOTE — Telephone Encounter (Signed)
rx written

## 2018-05-23 NOTE — Telephone Encounter (Signed)
Faxed order. Advised patient done.

## 2018-12-05 ENCOUNTER — Telehealth (INDEPENDENT_AMBULATORY_CARE_PROVIDER_SITE_OTHER): Payer: Self-pay | Admitting: Orthopedic Surgery

## 2018-12-05 NOTE — Telephone Encounter (Signed)
Patient called and stated that [facility] in [facility] called and stated that he needed a RX for the liners he's been using.

## 2018-12-08 NOTE — Telephone Encounter (Signed)
Order written and faxed to [phone] as requested.

## 2019-03-03 ENCOUNTER — Ambulatory Visit (INDEPENDENT_AMBULATORY_CARE_PROVIDER_SITE_OTHER): Payer: Medicare Other | Admitting: Orthopedic Surgery

## 2019-03-03 ENCOUNTER — Encounter: Payer: Self-pay | Admitting: Orthopedic Surgery

## 2019-03-03 ENCOUNTER — Other Ambulatory Visit: Payer: Self-pay

## 2019-03-03 VITALS — Ht 76.0 in | Wt 325.0 lb

## 2019-03-03 DIAGNOSIS — M7041 Prepatellar bursitis, right knee: Secondary | ICD-10-CM | POA: Diagnosis not present

## 2019-03-03 MED ORDER — CADEXOMER IODINE 0.9 % EX GEL: 1.0000 "application " | Freq: Every day | CUTANEOUS | 3 refills | Status: DC | PRN

## 2019-03-03 MED ORDER — DOXYCYCLINE HYCLATE 100 MG PO TABS: 100.0000 mg | ORAL_TABLET | Freq: Two times a day (BID) | ORAL | 0 refills | Status: DC

## 2019-03-03 NOTE — Progress Notes (Signed)
Office Visit Note   Patient: Gabriel Howe           Date of Birth: 11/12/1960           MLynford CitizenRN: [id number] Visit Date: 03/03/2019              Requested by: No referring provider defined for this encounter. PCP: System, Pcp Not In  Chief Complaint  Patient presents with   Right Leg - Follow-up    05/16/15 revision right BKA    Left Leg - Follow-up    08/19/13 left BKA       HPI: Patient is a 58 year old gentleman who is a bilateral amputee who presents with prepatellar bursitis of the right knee.  Patient has been working with [facility] [place].  He has had modifications to the socket and he will follow-up with them again today.  Patient complains of pain and tenderness to light touch over the patella.  Patient denies any mechanical symptoms of the right knee.  Assessment & Plan: Visit Diagnoses:  1. Prepatellar bursitis of right knee     Plan: Patient will be called in a prescription for Iodosorb and doxycycline.  He states he has had a allergic reaction to Bactroban causing facial swelling.  He will follow-up [facility] [place] they will restore the patella tendon support he may require a few extra ply sock to keep from subsiding.  Follow-Up Instructions: Return in about 2 weeks (around 03/17/2019).   Ortho Exam  Patient is alert, oriented, no adenopathy, well-dressed, normal affect, normal respiratory effort. Examination patient has cellulitis over the patella of the right knee there is an ulcer that goes down to the prepatellar bursa there is no purulent drainage no abscess.  With wearing the prosthesis there is no pressure from the prosthesis on the pre-patella ulcer.  Imaging: No results found. No images are attached to the encounter.  Labs: Lab Results  Component Value Date   HGBA1C 7.2 (H) 05/15/2015   HGBA1C 9.0 (H) 08/01/2013   HGBA1C 12.5 (H) 12/17/2012   REPTSTATUS 05/20/2015 FINAL 05/15/2015   GRAMSTAIN  08/01/2013    RARE WBC PRESENT,  PREDOMINANTLY PMN NO SQUAMOUS EPITHELIAL CELLS SEEN FEW GRAM POSITIVE COCCI IN PAIRS Performed at [facility]   CULT NO GROWTH 5 DAYS 05/15/2015     Lab Results  Component Value Date   ALBUMIN 1.9 (L) 05/16/2015   ALBUMIN 2.6 (L) 08/19/2013   ALBUMIN 2.9 (L) 08/01/2013    Body mass index is 39.56 kg/m.  Orders:  No orders of the defined types were placed in this encounter.  No orders of the defined types were placed in this encounter.    Procedures: No procedures performed  Clinical Data: No additional findings.  ROS:  All other systems negative, except as noted in the HPI. Review of Systems  Objective: Vital Signs: Ht 6\' 4"  (1.93 m)    Wt (!) 325 lb (147.4 kg)    BMI 39.56 kg/m   Specialty Comments:  No specialty comments available.  PMFS History: Patient Active Problem List   Diagnosis Date Noted   Non-pressure chronic ulcer of right calf, limited to breakdown of skin (HCC) 10/03/2016   Status post bilateral below knee amputation (HCC) 05/15/2015   Infection of below knee amputation stump (HCC) 05/15/2015   Infection of amputation stump of right lower extremity (HCC) 05/15/2015   Wound infection 05/15/2015   Diabetic osteomyelitis (HCC) 08/05/2013   Cellulitis and abscess of toe of left foot  08/01/2013   Hyponatremia 08/01/2013   Hypokalemia 08/01/2013   Sepsis associated hypotension (HCC) 08/01/2013   Leukocytosis 08/01/2013   Cellulitis and abscess of lower leg 12/17/2012   DM foot ulcer (HCC) 12/17/2012   DM2 (diabetes mellitus, type 2) (HCC) 12/17/2012   Diabetic neuropathy (HCC) 12/17/2012   Past Medical History:  Diagnosis Date   Arthritis    "ankles and feet" (08/19/2013)   Bronchitis    hx of;last time about 34yrs ago   Chronic back pain    Exertional shortness of breath    Headache(784.0)    "monthly" (08/19/2013)   Heart murmur    Hx: of as a child   Hyperlipidemia    takes Atorvastatin daily    Neuromuscular disorder (HCC)    diabetic neuropathy in feet   Type II diabetes mellitus (HCC)    takes Metformin bid and Lantus nightly    Family History  Problem Relation Age of Onset   Other Mother    Other Father     Past Surgical History:  Procedure Laterality Date   AMPUTATION Right 01/02/2013   Procedure: AMPUTATION BELOW KNEE;  Surgeon: Nadara Mustard, MD;  Location: [facility] OR;  Service: Orthopedics;  Laterality: Right;  Right Below Knee Amputation   AMPUTATION Left 04/25/2013   Procedure: AMPUTATION RAY;  Surgeon: Nadara Mustard, MD;  Location: [facility] OR;  Service: Orthopedics;  Laterality: Left;  Left Foot 3rd Ray Amputation   AMPUTATION Left 08/03/2013   Procedure: Left Midfoot Amputation;  Surgeon: Nadara Mustard, MD;  Location: [facility] OR;  Service: Orthopedics;  Laterality: Left;  Left Foot Transmetatarsal Amputation   AMPUTATION Left 08/19/2013   Procedure: AMPUTATION BELOW KNEE- left;  Surgeon: Nadara Mustard, MD;  Location: [facility] OR;  Service: Orthopedics;  Laterality: Left;  Left Below Knee Amputation   AMPUTATION Right 05/16/2015   Procedure: AMPUTATION BELOW KNEE/REVISION;  Surgeon: Nadara Mustard, MD;  Location: [facility] OR;  Service: Orthopedics;  Laterality: Right;   BELOW KNEE LEG AMPUTATION Left 08/19/2013   BELOW KNEE LEG AMPUTATION Right 12/2012   ESOPHAGOGASTRODUODENOSCOPY     STUMP REVISION Right 05/16/2015   TOE AMPUTATION Right 2012   at [facility], and removal of partial of second to 2012   TONSILLECTOMY     Social History   Occupational History   Not on file  Tobacco Use   Smoking status: Former Smoker    Years: 2.00    Types: Cigarettes   Smokeless tobacco: Never Used   Tobacco comment: 08/19/2013 "quit 35yrs ago"  Substance and Sexual Activity   Alcohol use: Yes    Comment: 08/19/2013 "couple drinks maybe once/month" 04/2015 social    Drug use: Yes    Comment: 08/19/2013 "recreational drugs 30 yr ago"   Sexual activity: Not Currently

## 2019-03-04 ENCOUNTER — Telehealth: Payer: Self-pay | Admitting: Orthopedic Surgery

## 2019-03-04 NOTE — Telephone Encounter (Signed)
After multiple attempts with cover my meds and phone calls to the insurance company  A verbal prior Berkley Harvey was processed for medication approval. This will take about 72 hours turn around time. I will call once the determination has been made.  [facility] # [phone] pt has used bactroban in the past and is allergic to this causes facila swelling and has also tried santyl without good results in the past. The pt has a right patella bursa ulceration and was in the office yesterday for eval.

## 2019-03-04 NOTE — Telephone Encounter (Signed)
Patient called stating he received a call from the pharmacist at [facility] stating a prior authorization is needed for his prescription of Iodosorb gel.

## 2019-03-17 ENCOUNTER — Ambulatory Visit (INDEPENDENT_AMBULATORY_CARE_PROVIDER_SITE_OTHER): Payer: Medicare Other | Admitting: Orthopedic Surgery

## 2019-03-17 ENCOUNTER — Encounter: Payer: Self-pay | Admitting: Orthopedic Surgery

## 2019-03-17 ENCOUNTER — Other Ambulatory Visit: Payer: Self-pay

## 2019-03-17 ENCOUNTER — Ambulatory Visit (INDEPENDENT_AMBULATORY_CARE_PROVIDER_SITE_OTHER): Payer: Medicare Other

## 2019-03-17 VITALS — Ht 76.0 in | Wt 325.0 lb

## 2019-03-17 DIAGNOSIS — L98492 Non-pressure chronic ulcer of skin of other sites with fat layer exposed: Secondary | ICD-10-CM

## 2019-03-17 DIAGNOSIS — M7041 Prepatellar bursitis, right knee: Secondary | ICD-10-CM

## 2019-03-17 DIAGNOSIS — M869 Osteomyelitis, unspecified: Secondary | ICD-10-CM

## 2019-03-17 DIAGNOSIS — E1142 Type 2 diabetes mellitus with diabetic polyneuropathy: Secondary | ICD-10-CM

## 2019-03-17 NOTE — Progress Notes (Signed)
Office Visit Note   Patient: Gabriel Howe           Date of Birth: 1961/10/20           MRN: [id number] Visit Date: 03/17/2019              Requested by: No referring provider defined for this encounter. PCP: System, Pcp Not In  Chief Complaint  Patient presents with  . Right Knee - Follow-up    Prepatellar bursitis  Hx bilateral BKA       HPI: Patient is a 58 year old gentleman who presents with 2 acute problems.  #1 he has had increasing cellulitis pain and purulent drainage from his prepatellar bursitis right knee he has been on doxycycline using Iodosorb dressing changes with worsening of his symptoms.  Patient also presents with worsening ulceration of the right index finger.  Patient states he is status post a burn to the index and long finger underwent amputation of the long finger at the DIP joint and was treated with conservative treatment for the index finger.  He states he is developed ulceration cellulitis pain and swelling to the index finger.  Assessment & Plan: Visit Diagnoses:  1. Prepatellar bursitis of right knee   2. Skin ulcer of finger with fat layer exposed (HCC)   3. Type 2 diabetes mellitus with diabetic polyneuropathy, unspecified whether long term insulin use (HCC)   4. Osteomyelitis of finger of right hand (HCC)     Plan: Discussed with the patient and we would need to proceed with surgery for both of these issues.  Would plan for excision of the prepatellar bursa ulcer excision of the bursa debridement and wound closure with IV antibiotics.  Patient will require a amputation of the right index finger just proximal to the DIP joint.  Risks and benefits of surgery were discussed patient states he understands wished to proceed.  With plan for surgery on Friday with IV antibiotics postoperatively and would plan for at least 3 weeks of antibiotics at discharge pending the soft tissue cultures.  Patient states he understands wished to proceed.  Follow-Up  Instructions: Return in about 1 week (around 03/24/2019).   Ortho Exam  Patient is alert, oriented, no adenopathy, well-dressed, normal affect, normal respiratory effort. Examination the right knee he has cellulitis of the prepatellar bursa.  There is an open wound about 5 mm in diameter with purulent drainage.  There is no ascending cellulitis there is no knee effusion no pain with range of motion of the knee.  His transtibial amputation is well-healed no ulceration.  Semination of the right index finger patient has ulceration of the finger from the DIP joint distally.  There is cellulitis up to the PIP joint.  The cellulitis is not tender to palpation.  Imaging: Xr Finger Index Right  Result Date: 03/17/2019 2 view radiographs of the right index fingers shows complete destruction of the tuft of the index finger from osteomyelitis.  No images are attached to the encounter.  Labs: Lab Results  Component Value Date   HGBA1C 7.2 (H) 05/15/2015   HGBA1C 9.0 (H) 08/01/2013   HGBA1C 12.5 (H) 12/17/2012   REPTSTATUS 05/20/2015 FINAL 05/15/2015   GRAMSTAIN  08/01/2013    RARE WBC PRESENT, PREDOMINANTLY PMN NO SQUAMOUS EPITHELIAL CELLS SEEN FEW GRAM POSITIVE COCCI IN PAIRS Performed at [facility]   CULT NO GROWTH 5 DAYS 05/15/2015     Lab Results  Component Value Date   ALBUMIN 1.9 (L) 05/16/2015  ALBUMIN 2.6 (L) 08/19/2013   ALBUMIN 2.9 (L) 08/01/2013    Body mass index is 39.56 kg/m.  Orders:  Orders Placed This Encounter  Procedures  . XR Finger Index Right   No orders of the defined types were placed in this encounter.    Procedures: No procedures performed  Clinical Data: No additional findings.  ROS:  All other systems negative, except as noted in the HPI. Review of Systems  Objective: Vital Signs: Ht 6\' 4"  (1.93 m)   Wt (!) 325 lb (147.4 kg)   BMI 39.56 kg/m   Specialty Comments:  No specialty comments available.  PMFS History: Patient  Active Problem List   Diagnosis Date Noted  . Osteomyelitis of finger of right hand (HCC) 03/17/2019  . Non-pressure chronic ulcer of right calf, limited to breakdown of skin (HCC) 10/03/2016  . Status post bilateral below knee amputation (HCC) 05/15/2015  . Infection of below knee amputation stump (HCC) 05/15/2015  . Infection of amputation stump of right lower extremity (HCC) 05/15/2015  . Wound infection 05/15/2015  . Diabetic osteomyelitis (HCC) 08/05/2013  . Cellulitis and abscess of toe of left foot 08/01/2013  . Hyponatremia 08/01/2013  . Hypokalemia 08/01/2013  . Sepsis associated hypotension (HCC) 08/01/2013  . Leukocytosis 08/01/2013  . Cellulitis and abscess of lower leg 12/17/2012  . DM foot ulcer (HCC) 12/17/2012  . DM2 (diabetes mellitus, type 2) (HCC) 12/17/2012  . Diabetic neuropathy (HCC) 12/17/2012   Past Medical History:  Diagnosis Date  . Arthritis    "ankles and feet" (08/19/2013)  . Bronchitis    hx of;last time about 4476yrs ago  . Chronic back pain   . Exertional shortness of breath   . WUJWJXBJ(478.2Headache(784.0)    "monthly" (08/19/2013)  . Heart murmur    Hx: of as a child  . Hyperlipidemia    takes Atorvastatin daily  . Neuromuscular disorder (HCC)    diabetic neuropathy in feet  . Type II diabetes mellitus (HCC)    takes Metformin bid and Lantus nightly    Family History  Problem Relation Age of Onset  . Other Mother   . Other Father     Past Surgical History:  Procedure Laterality Date  . AMPUTATION Right 01/02/2013   Procedure: AMPUTATION BELOW KNEE;  Surgeon: Nadara MustardMarcus V David Towson, MD;  Location: [facility] OR;  Service: Orthopedics;  Laterality: Right;  Right Below Knee Amputation  . AMPUTATION Left 04/25/2013   Procedure: AMPUTATION RAY;  Surgeon: Nadara MustardMarcus V Lachanda Buczek, MD;  [facility];  Service: Orthopedics;  Laterality: Left;  Left Foot 3rd Ray Amputation  . AMPUTATION Left 08/03/2013   Procedure: Left Midfoot Amputation;  Surgeon: Nadara MustardMarcus V Kerron Sedano, MD;  [facility];   Service: Orthopedics;  Laterality: Left;  Left Foot Transmetatarsal Amputation  . AMPUTATION Left 08/19/2013   Procedure: AMPUTATION BELOW KNEE- left;  Surgeon: Nadara MustardMarcus V Manvir Thorson, MD;  [facility]: [facility] OR;  Service: Orthopedics;  Laterality: Left;  Left Below Knee Amputation  . AMPUTATION Right 05/16/2015   Procedure: AMPUTATION BELOW KNEE/REVISION;  Surgeon: Nadara MustardMarcus Hiedi Touchton V, MD;  Location: [facility] OR;  Service: Orthopedics;  Laterality: Right;  . BELOW KNEE LEG AMPUTATION Left 08/19/2013  . BELOW KNEE LEG AMPUTATION Right 12/2012  . ESOPHAGOGASTRODUODENOSCOPY    . STUMP REVISION Right 05/16/2015  . TOE AMPUTATION Right 2012   at [facility], and removal of partial of second to 2012  . TONSILLECTOMY     Social History   Occupational History  . Not on file  Tobacco Use  . Smoking status: Former Smoker    Years: 2.00    Types: Cigarettes  . Smokeless tobacco: Never Used  . Tobacco comment: 08/19/2013 "quit 60yrs ago"  Substance and Sexual Activity  . Alcohol use: Yes    Comment: 08/19/2013 "couple drinks maybe once/month" 04/2015 social   . Drug use: Yes    Comment: 08/19/2013 "recreational drugs 30 yr ago"  . Sexual activity: Not Currently

## 2019-03-18 ENCOUNTER — Other Ambulatory Visit: Payer: Self-pay

## 2019-03-18 ENCOUNTER — Encounter (HOSPITAL_COMMUNITY): Payer: Self-pay | Admitting: *Deleted

## 2019-03-18 ENCOUNTER — Other Ambulatory Visit: Payer: Self-pay | Admitting: Family

## 2019-03-18 ENCOUNTER — Ambulatory Visit: Payer: Medicare Other | Admitting: Orthopedic Surgery

## 2019-03-18 NOTE — Progress Notes (Signed)
   03/18/19 1300  OBSTRUCTIVE SLEEP APNEA  Have you ever been diagnosed with sleep apnea through a sleep study? No  Do you snore loudly (loud enough to be heard through closed doors)?  0  Do you often feel tired, fatigued, or sleepy during the daytime (such as falling asleep during driving or talking to someone)? 0  Has anyone observed you stop breathing during your sleep? 0  Do you have, or are you being treated for high blood pressure? 1  BMI more than 35 kg/m2? 1  Age > 50 (1-yes) 1  Neck circumference greater than:Male 16 inches or larger, Male 17inches or larger? 1  Male Gender (Yes=1) 1  Obstructive Sleep Apnea Score 5

## 2019-03-18 NOTE — Progress Notes (Signed)
Pt denies SOB, chest pain, and being under the care of a cardiologist. Pt denioes having a stress test, echo and cardiac cath. Pt denies having a chest x ray within the last year. Nurse requested EKG tracing, discharge summary, any cardiac studies and chest x ray from [facility]; awaiting records. Pt denies recent labs. Pt made aware to stop taking taking Aspirin (unless otherwise advised by surgeon), vitamins, fish oil and herbal medications. Do not take any NSAIDs ie: Ibuprofen, Advil, Naproxen (Aleve), Motrin, BC and Goody Powder.  Pt made aware to not take diabetes pills on DOS (Metformin and Actos) and to take only 25 units of Tresiba insulin the morning of surgery if blood glucose is > 70. Pt made aware to check BG every 2 hours prior to arrival to hospital on DOS. Pt made aware to treat a BG < 70 with  4 ounces of apple  juice, wait 15 minutes after intervention to recheck BG, if BG remains < 70, call Short Stay unit to speak with a nurse.  Pt denies that he and family members tested positive for COVID-19 ( pt scheduled to be tested on DOS).  Pt denies that he and family members experienced the following symptoms:  Cough yes/no: No Fever (>100.27F)  yes/no: No Runny nose yes/no: No Sore throat yes/no: No Difficulty breathing/shortness of breath  yes/no: No  Have you or a family member traveled in the last 14 days and where? yes/no: No  Pt reminded that hospital visitation restrictions are in effect and the importance of the restrictions.   Pt verbalized understanding of all pre-op instructions.

## 2019-03-19 MED ORDER — DEXTROSE 5 % IV SOLN
3.0000 g | INTRAVENOUS | Status: AC
  Administered 2019-03-20: 3 g via INTRAVENOUS
  Filled 2019-03-19: qty 3

## 2019-03-20 ENCOUNTER — Other Ambulatory Visit (HOSPITAL_COMMUNITY): Payer: Self-pay

## 2019-03-20 ENCOUNTER — Other Ambulatory Visit: Payer: Self-pay

## 2019-03-20 ENCOUNTER — Inpatient Hospital Stay (HOSPITAL_COMMUNITY): Payer: Medicare Other | Admitting: Physician Assistant

## 2019-03-20 ENCOUNTER — Encounter (HOSPITAL_COMMUNITY)
Admission: RE | Disposition: A | Discharge status: Home / Self Care | Payer: Self-pay | Source: Home / Self Care | Attending: Orthopedic Surgery

## 2019-03-20 ENCOUNTER — Other Ambulatory Visit (HOSPITAL_COMMUNITY)
Admission: RE | Admit: 2019-03-20 | Discharge: 2019-03-20 | Disposition: A | Discharge status: Home / Self Care | Payer: Medicare Other | Source: Ambulatory Visit | Attending: Orthopedic Surgery | Admitting: Orthopedic Surgery

## 2019-03-20 ENCOUNTER — Inpatient Hospital Stay (HOSPITAL_COMMUNITY)
Admission: RE | Admit: 2019-03-20 | Discharge: 2019-03-24 | DRG: 988 | Disposition: A | Discharge status: Home / Self Care | Payer: Medicare Other | Attending: Orthopedic Surgery | Admitting: Orthopedic Surgery

## 2019-03-20 ENCOUNTER — Encounter (HOSPITAL_COMMUNITY): Payer: Self-pay

## 2019-03-20 DIAGNOSIS — Z1159 Encounter for screening for other viral diseases: Secondary | ICD-10-CM | POA: Diagnosis present

## 2019-03-20 DIAGNOSIS — M868X4 Other osteomyelitis, hand: Secondary | ICD-10-CM | POA: Diagnosis present

## 2019-03-20 DIAGNOSIS — L03011 Cellulitis of right finger: Secondary | ICD-10-CM | POA: Diagnosis present

## 2019-03-20 DIAGNOSIS — M869 Osteomyelitis, unspecified: Secondary | ICD-10-CM

## 2019-03-20 DIAGNOSIS — Z888 Allergy status to other drugs, medicaments and biological substances status: Secondary | ICD-10-CM | POA: Diagnosis not present

## 2019-03-20 DIAGNOSIS — Z89512 Acquired absence of left leg below knee: Secondary | ICD-10-CM

## 2019-03-20 DIAGNOSIS — M71061 Abscess of bursa, right knee: Secondary | ICD-10-CM

## 2019-03-20 DIAGNOSIS — E1169 Type 2 diabetes mellitus with other specified complication: Secondary | ICD-10-CM | POA: Diagnosis present

## 2019-03-20 DIAGNOSIS — Z91013 Allergy to seafood: Secondary | ICD-10-CM | POA: Diagnosis not present

## 2019-03-20 DIAGNOSIS — Z87891 Personal history of nicotine dependence: Secondary | ICD-10-CM

## 2019-03-20 DIAGNOSIS — Z794 Long term (current) use of insulin: Secondary | ICD-10-CM | POA: Diagnosis not present

## 2019-03-20 DIAGNOSIS — Z882 Allergy status to sulfonamides status: Secondary | ICD-10-CM

## 2019-03-20 DIAGNOSIS — E1140 Type 2 diabetes mellitus with diabetic neuropathy, unspecified: Secondary | ICD-10-CM | POA: Diagnosis present

## 2019-03-20 DIAGNOSIS — E785 Hyperlipidemia, unspecified: Secondary | ICD-10-CM | POA: Diagnosis present

## 2019-03-20 DIAGNOSIS — Z89511 Acquired absence of right leg below knee: Secondary | ICD-10-CM

## 2019-03-20 HISTORY — PX: AMPUTATION: SHX166

## 2019-03-20 HISTORY — DX: Presence of spectacles and contact lenses: Z97.3

## 2019-03-20 HISTORY — PX: I & D EXTREMITY: SHX5045

## 2019-03-20 LAB — COMPREHENSIVE METABOLIC PANEL
ALT: 14 U/L (ref 0–44)
AST: 14 U/L — ABNORMAL LOW (ref 15–41)
Albumin: 3.2 g/dL — ABNORMAL LOW (ref 3.5–5.0)
Alkaline Phosphatase: 80 U/L (ref 38–126)
Anion gap: 12 (ref 5–15)
BUN: 22 mg/dL — ABNORMAL HIGH (ref 6–20)
CO2: 20 mmol/L — ABNORMAL LOW (ref 22–32)
Calcium: 9.3 mg/dL (ref 8.9–10.3)
Chloride: 105 mmol/L (ref 98–111)
Creatinine, Ser: 1.11 mg/dL (ref 0.61–1.24)
GFR calc Af Amer: >60 mL/min (ref >60)
GFR calc non Af Amer: >60 mL/min (ref >60)
Glucose, Bld: 214 mg/dL — ABNORMAL HIGH (ref 70–99)
Potassium: 4.4 mmol/L (ref 3.5–5.1)
Sodium: 137 mmol/L (ref 135–145)
Total Bilirubin: 0.9 mg/dL (ref 0.3–1.2)
Total Protein: 8.4 g/dL — ABNORMAL HIGH (ref 6.5–8.1)

## 2019-03-20 LAB — CBC WITH DIFFERENTIAL/PLATELET
Abs Immature Granulocytes: 0.05 10*3/uL (ref 0.00–0.07)
Basophils Absolute: 0.1 10*3/uL (ref 0.0–0.1)
Basophils Relative: 0 %
Eosinophils Absolute: 0.2 10*3/uL (ref 0.0–0.5)
Eosinophils Relative: 1 %
HCT: 43.2 % (ref 39.0–52.0)
Hemoglobin: 13.5 g/dL (ref 13.0–17.0)
Immature Granulocytes: 0 %
Lymphocytes Relative: 28 %
Lymphs Abs: 3.6 10*3/uL (ref 0.7–4.0)
MCH: 25.6 pg — ABNORMAL LOW (ref 26.0–34.0)
MCHC: 31.3 g/dL (ref 30.0–36.0)
MCV: 81.8 fL (ref 80.0–100.0)
Monocytes Absolute: 1 10*3/uL (ref 0.1–1.0)
Monocytes Relative: 8 %
Neutro Abs: 8.1 10*3/uL — ABNORMAL HIGH (ref 1.7–7.7)
Neutrophils Relative %: 63 %
Platelets: 333 10*3/uL (ref 150–400)
RBC: 5.28 MIL/uL (ref 4.22–5.81)
RDW: 15 % (ref 11.5–15.5)
WBC: 13 10*3/uL — ABNORMAL HIGH (ref 4.0–10.5)
nRBC: 0 % (ref 0.0–0.2)

## 2019-03-20 LAB — SARS CORONAVIRUS 2 BY RT PCR (HOSPITAL ORDER, PERFORMED IN ~~LOC~~ HOSPITAL LAB): SARS Coronavirus 2: NEGATIVE

## 2019-03-20 LAB — GLUCOSE, CAPILLARY
Glucose-Capillary: 206 mg/dL — ABNORMAL HIGH (ref 70–99)
Glucose-Capillary: 197 mg/dL — ABNORMAL HIGH (ref 70–99)
Glucose-Capillary: 179 mg/dL — ABNORMAL HIGH (ref 70–99)
Glucose-Capillary: 202 mg/dL — ABNORMAL HIGH (ref 70–99)
Glucose-Capillary: 217 mg/dL — ABNORMAL HIGH (ref 70–99)
Glucose-Capillary: 277 mg/dL — ABNORMAL HIGH (ref 70–99)

## 2019-03-20 SURGERY — IRRIGATION AND DEBRIDEMENT EXTREMITY
Anesthesia: General | Site: Leg Upper | Laterality: Right

## 2019-03-20 MED ORDER — DEXAMETHASONE SODIUM PHOSPHATE 10 MG/ML IJ SOLN
INTRAMUSCULAR | Status: AC
  Filled 2019-03-20: qty 1

## 2019-03-20 MED ORDER — POLYETHYLENE GLYCOL 3350 17 G PO PACK: 17.0000 g | PACK | Freq: Every day | ORAL | Status: DC | PRN

## 2019-03-20 MED ORDER — ONDANSETRON HCL 4 MG PO TABS: 4.0000 mg | ORAL_TABLET | Freq: Four times a day (QID) | ORAL | Status: DC | PRN

## 2019-03-20 MED ORDER — LACTATED RINGERS IV SOLN
INTRAVENOUS | Status: DC
  Administered 2019-03-20: 11:00:00 via INTRAVENOUS

## 2019-03-20 MED ORDER — SODIUM CHLORIDE 0.9 % IV SOLN
INTRAVENOUS | Status: DC
  Administered 2019-03-20: 17:00:00 via INTRAVENOUS

## 2019-03-20 MED ORDER — ATORVASTATIN CALCIUM 40 MG PO TABS
40.0000 mg | ORAL_TABLET | Freq: Every day | ORAL | Status: DC
  Administered 2019-03-20 – 2019-03-23 (×4): 40 mg via ORAL
  Filled 2019-03-20 (×4): qty 1

## 2019-03-20 MED ORDER — METHOCARBAMOL 1000 MG/10ML IJ SOLN
500.0000 mg | Freq: Four times a day (QID) | INTRAVENOUS | Status: DC | PRN
  Filled 2019-03-20 (×3): qty 5

## 2019-03-20 MED ORDER — METOCLOPRAMIDE HCL 5 MG PO TABS: 5.0000 mg | ORAL_TABLET | Freq: Three times a day (TID) | ORAL | Status: DC | PRN

## 2019-03-20 MED ORDER — CHLORHEXIDINE GLUCONATE 4 % EX LIQD: 60.0000 mL | Freq: Once | CUTANEOUS | Status: DC

## 2019-03-20 MED ORDER — INSULIN ASPART 100 UNIT/ML ~~LOC~~ SOLN
0.0000 [IU] | Freq: Three times a day (TID) | SUBCUTANEOUS | Status: DC
  Administered 2019-03-20 – 2019-03-21 (×2): 7 [IU] via SUBCUTANEOUS
  Administered 2019-03-21 (×2): 4 [IU] via SUBCUTANEOUS
  Administered 2019-03-22: 3 [IU] via SUBCUTANEOUS
  Administered 2019-03-22 – 2019-03-24 (×6): 4 [IU] via SUBCUTANEOUS

## 2019-03-20 MED ORDER — MEPERIDINE HCL 25 MG/ML IJ SOLN: 6.2500 mg | INTRAMUSCULAR | Status: DC | PRN

## 2019-03-20 MED ORDER — ACETAMINOPHEN 160 MG/5ML PO SOLN: 325.0000 mg | ORAL | Status: DC | PRN

## 2019-03-20 MED ORDER — LIDOCAINE 2% (20 MG/ML) 5 ML SYRINGE
INTRAMUSCULAR | Status: AC
  Filled 2019-03-20: qty 5

## 2019-03-20 MED ORDER — EPHEDRINE 5 MG/ML INJ
INTRAVENOUS | Status: AC
  Filled 2019-03-20: qty 20

## 2019-03-20 MED ORDER — BISACODYL 10 MG RE SUPP: 10.0000 mg | Freq: Every day | RECTAL | Status: DC | PRN

## 2019-03-20 MED ORDER — FENTANYL CITRATE (PF) 250 MCG/5ML IJ SOLN
INTRAMUSCULAR | Status: AC
  Filled 2019-03-20: qty 5

## 2019-03-20 MED ORDER — FENTANYL CITRATE (PF) 250 MCG/5ML IJ SOLN
INTRAMUSCULAR | Status: DC | PRN
  Administered 2019-03-20: 100 ug via INTRAVENOUS
  Administered 2019-03-20: 50 ug via INTRAVENOUS

## 2019-03-20 MED ORDER — OXYCODONE HCL 5 MG PO TABS
ORAL_TABLET | ORAL | Status: AC
  Filled 2019-03-20: qty 1

## 2019-03-20 MED ORDER — ONDANSETRON HCL 4 MG/2ML IJ SOLN: 4.0000 mg | Freq: Once | INTRAMUSCULAR | Status: DC | PRN

## 2019-03-20 MED ORDER — MIDAZOLAM HCL 5 MG/5ML IJ SOLN
INTRAMUSCULAR | Status: DC | PRN
  Administered 2019-03-20: 2 mg via INTRAVENOUS

## 2019-03-20 MED ORDER — ASPIRIN EC 325 MG PO TBEC
325.0000 mg | DELAYED_RELEASE_TABLET | Freq: Every day | ORAL | Status: DC
  Administered 2019-03-20 – 2019-03-24 (×4): 325 mg via ORAL
  Filled 2019-03-20 (×5): qty 1

## 2019-03-20 MED ORDER — SUCCINYLCHOLINE CHLORIDE 200 MG/10ML IV SOSY
PREFILLED_SYRINGE | INTRAVENOUS | Status: AC
  Filled 2019-03-20: qty 10

## 2019-03-20 MED ORDER — ONDANSETRON HCL 4 MG/2ML IJ SOLN
INTRAMUSCULAR | Status: DC | PRN
  Administered 2019-03-20: 4 mg via INTRAVENOUS

## 2019-03-20 MED ORDER — ACETAMINOPHEN 325 MG PO TABS: 325.0000 mg | ORAL_TABLET | ORAL | Status: DC | PRN

## 2019-03-20 MED ORDER — CEFAZOLIN SODIUM-DEXTROSE 2-4 GM/100ML-% IV SOLN
2.0000 g | Freq: Three times a day (TID) | INTRAVENOUS | Status: AC
  Administered 2019-03-20 – 2019-03-23 (×9): 2 g via INTRAVENOUS
  Filled 2019-03-20 (×10): qty 100

## 2019-03-20 MED ORDER — MAGNESIUM CITRATE PO SOLN: 1.0000 | Freq: Once | ORAL | Status: DC | PRN

## 2019-03-20 MED ORDER — DOCUSATE SODIUM 100 MG PO CAPS
100.0000 mg | ORAL_CAPSULE | Freq: Two times a day (BID) | ORAL | Status: DC
  Administered 2019-03-21: 100 mg via ORAL
  Filled 2019-03-20 (×8): qty 1

## 2019-03-20 MED ORDER — PROPOFOL 10 MG/ML IV BOLUS
INTRAVENOUS | Status: DC | PRN
  Administered 2019-03-20: 200 mg via INTRAVENOUS

## 2019-03-20 MED ORDER — METFORMIN HCL 500 MG PO TABS
1000.0000 mg | ORAL_TABLET | Freq: Two times a day (BID) | ORAL | Status: DC
  Administered 2019-03-20 – 2019-03-24 (×8): 1000 mg via ORAL
  Filled 2019-03-20 (×8): qty 2

## 2019-03-20 MED ORDER — PROPOFOL 10 MG/ML IV BOLUS
INTRAVENOUS | Status: AC
  Filled 2019-03-20: qty 20

## 2019-03-20 MED ORDER — INSULIN ASPART 100 UNIT/ML ~~LOC~~ SOLN
6.0000 [IU] | Freq: Three times a day (TID) | SUBCUTANEOUS | Status: DC
  Administered 2019-03-20 – 2019-03-24 (×11): 6 [IU] via SUBCUTANEOUS

## 2019-03-20 MED ORDER — FENTANYL CITRATE (PF) 100 MCG/2ML IJ SOLN
INTRAMUSCULAR | Status: AC
  Filled 2019-03-20: qty 2

## 2019-03-20 MED ORDER — HYDROMORPHONE HCL 1 MG/ML IJ SOLN: 0.5000 mg | INTRAMUSCULAR | Status: DC | PRN

## 2019-03-20 MED ORDER — OXYCODONE HCL 5 MG PO TABS: 5.0000 mg | ORAL_TABLET | ORAL | Status: DC | PRN

## 2019-03-20 MED ORDER — METOCLOPRAMIDE HCL 5 MG/ML IJ SOLN: 5.0000 mg | Freq: Three times a day (TID) | INTRAMUSCULAR | Status: DC | PRN

## 2019-03-20 MED ORDER — ACETAMINOPHEN 325 MG PO TABS: 325.0000 mg | ORAL_TABLET | Freq: Four times a day (QID) | ORAL | Status: DC | PRN

## 2019-03-20 MED ORDER — ONDANSETRON HCL 4 MG/2ML IJ SOLN: 4.0000 mg | Freq: Four times a day (QID) | INTRAMUSCULAR | Status: DC | PRN

## 2019-03-20 MED ORDER — METHOCARBAMOL 500 MG PO TABS: 500.0000 mg | ORAL_TABLET | Freq: Four times a day (QID) | ORAL | Status: DC | PRN

## 2019-03-20 MED ORDER — PIOGLITAZONE HCL 15 MG PO TABS
15.0000 mg | ORAL_TABLET | Freq: Every day | ORAL | Status: DC
  Administered 2019-03-21 – 2019-03-24 (×4): 15 mg via ORAL
  Filled 2019-03-20 (×4): qty 1

## 2019-03-20 MED ORDER — MIDAZOLAM HCL 2 MG/2ML IJ SOLN
INTRAMUSCULAR | Status: AC
  Filled 2019-03-20: qty 2

## 2019-03-20 MED ORDER — PHENYLEPHRINE 40 MCG/ML (10ML) SYRINGE FOR IV PUSH (FOR BLOOD PRESSURE SUPPORT)
PREFILLED_SYRINGE | INTRAVENOUS | Status: DC | PRN
  Administered 2019-03-20: 120 ug via INTRAVENOUS
  Administered 2019-03-20: 160 ug via INTRAVENOUS
  Administered 2019-03-20: 120 ug via INTRAVENOUS

## 2019-03-20 MED ORDER — OXYCODONE HCL 5 MG/5ML PO SOLN: 5.0000 mg | Freq: Once | ORAL | Status: AC | PRN

## 2019-03-20 MED ORDER — 0.9 % SODIUM CHLORIDE (POUR BTL) OPTIME
TOPICAL | Status: DC | PRN
  Administered 2019-03-20: 1000 mL

## 2019-03-20 MED ORDER — OXYCODONE HCL 5 MG PO TABS
5.0000 mg | ORAL_TABLET | Freq: Once | ORAL | Status: AC | PRN
  Administered 2019-03-20: 5 mg via ORAL

## 2019-03-20 MED ORDER — ONDANSETRON HCL 4 MG/2ML IJ SOLN
INTRAMUSCULAR | Status: AC
  Filled 2019-03-20: qty 2

## 2019-03-20 MED ORDER — OXYCODONE HCL 5 MG PO TABS: 10.0000 mg | ORAL_TABLET | ORAL | Status: DC | PRN

## 2019-03-20 MED ORDER — EPHEDRINE SULFATE-NACL 50-0.9 MG/10ML-% IV SOSY
PREFILLED_SYRINGE | INTRAVENOUS | Status: DC | PRN
  Administered 2019-03-20: 10 mg via INTRAVENOUS

## 2019-03-20 MED ORDER — DEXAMETHASONE SODIUM PHOSPHATE 10 MG/ML IJ SOLN
INTRAMUSCULAR | Status: DC | PRN
  Administered 2019-03-20: 4 mg via INTRAVENOUS

## 2019-03-20 MED ORDER — FENTANYL CITRATE (PF) 100 MCG/2ML IJ SOLN
25.0000 ug | INTRAMUSCULAR | Status: DC | PRN
  Administered 2019-03-20 (×2): 50 ug via INTRAVENOUS

## 2019-03-20 SURGICAL SUPPLY — 40 items
BLADE SURG 21 STRL SS (BLADE) ×3 IMPLANT
BNDG COHESIVE 2X5 TAN STRL LF (GAUZE/BANDAGES/DRESSINGS) ×3 IMPLANT
BNDG COHESIVE 4X5 TAN STRL (GAUZE/BANDAGES/DRESSINGS) ×3 IMPLANT
BNDG COHESIVE 6X5 TAN STRL LF (GAUZE/BANDAGES/DRESSINGS) IMPLANT
BNDG ESMARK 4X9 LF (GAUZE/BANDAGES/DRESSINGS) IMPLANT
BNDG GAUZE ELAST 4 BULKY (GAUZE/BANDAGES/DRESSINGS) ×6 IMPLANT
COVER SURGICAL LIGHT HANDLE (MISCELLANEOUS) ×6 IMPLANT
COVER WAND RF STERILE (DRAPES) ×3 IMPLANT
DRAPE U-SHAPE 47X51 STRL (DRAPES) ×3 IMPLANT
DRSG ADAPTIC 3X8 NADH LF (GAUZE/BANDAGES/DRESSINGS) ×6 IMPLANT
DRSG PAD ABDOMINAL 8X10 ST (GAUZE/BANDAGES/DRESSINGS) ×3 IMPLANT
DURAPREP 26ML APPLICATOR (WOUND CARE) ×3 IMPLANT
ELECT REM PT RETURN 9FT ADLT (ELECTROSURGICAL) ×3
ELECTRODE REM PT RTRN 9FT ADLT (ELECTROSURGICAL) ×2 IMPLANT
GAUZE SPONGE 4X4 12PLY STRL (GAUZE/BANDAGES/DRESSINGS) ×3 IMPLANT
GAUZE SPONGE 4X4 12PLY STRL LF (GAUZE/BANDAGES/DRESSINGS) ×6 IMPLANT
GLOVE BIOGEL PI IND STRL 9 (GLOVE) ×2 IMPLANT
GLOVE BIOGEL PI INDICATOR 9 (GLOVE) ×1
GLOVE SURG ORTHO 9.0 STRL STRW (GLOVE) ×3 IMPLANT
GOWN STRL REUS W/ TWL XL LVL3 (GOWN DISPOSABLE) ×4 IMPLANT
GOWN STRL REUS W/TWL XL LVL3 (GOWN DISPOSABLE) ×2
HANDPIECE INTERPULSE COAX TIP (DISPOSABLE)
KIT BASIN OR (CUSTOM PROCEDURE TRAY) ×3 IMPLANT
KIT TURNOVER KIT B (KITS) ×3 IMPLANT
MANIFOLD NEPTUNE II (INSTRUMENTS) ×3 IMPLANT
NEEDLE 22X1 1/2 (OR ONLY) (NEEDLE) IMPLANT
NS IRRIG 1000ML POUR BTL (IV SOLUTION) ×3 IMPLANT
PACK ORTHO EXTREMITY (CUSTOM PROCEDURE TRAY) ×3 IMPLANT
PAD ABD 8X10 STRL (GAUZE/BANDAGES/DRESSINGS) ×3 IMPLANT
PAD ARMBOARD 7.5X6 YLW CONV (MISCELLANEOUS) ×6 IMPLANT
SET HNDPC FAN SPRY TIP SCT (DISPOSABLE) IMPLANT
STOCKINETTE IMPERVIOUS 9X36 MD (GAUZE/BANDAGES/DRESSINGS) IMPLANT
SUT ETHILON 2 0 PSLX (SUTURE) ×3 IMPLANT
SUT ETHILON 3 0 PS 1 (SUTURE) ×3 IMPLANT
SWAB COLLECTION DEVICE MRSA (MISCELLANEOUS) ×3 IMPLANT
SWAB CULTURE ESWAB REG 1ML (MISCELLANEOUS) IMPLANT
SYR CONTROL 10ML LL (SYRINGE) IMPLANT
TOWEL OR 17X26 10 PK STRL BLUE (TOWEL DISPOSABLE) ×3 IMPLANT
TUBE CONNECTING 12X1/4 (SUCTIONS) ×3 IMPLANT
YANKAUER SUCT BULB TIP NO VENT (SUCTIONS) ×3 IMPLANT

## 2019-03-20 NOTE — Transfer of Care (Signed)
Immediate Anesthesia Transfer of Care Note  Patient: Gabriel Howe  Procedure(s) Performed: IRRIGATION AND DEBRIDEMENT RIGHT PREPATELLAR BURSA (Right Leg Upper) REVISION AMPUTATION RIGHT INDEX FINGER (Right Finger)  Patient Location: PACU  Anesthesia Type:General  Level of Consciousness: awake  Airway & Oxygen Therapy: Patient Spontanous Breathing and Patient connected to face mask oxygen  Post-op Assessment: Report given to RN and Post -op Vital signs reviewed and stable  Post vital signs: Reviewed and stable  Last Vitals:  Vitals Value Taken Time  BP 143/94 03/20/2019  2:49 PM  Temp    Pulse 91 03/20/2019  2:51 PM  Resp 7 03/20/2019  2:51 PM  SpO2 98 % 03/20/2019  2:51 PM  Vitals shown include unvalidated device data.  Last Pain:  Vitals:   03/20/19 1105  TempSrc:   PainSc: 3       Patients Stated Pain Goal: 1 (03/20/19 1105)  Complications: No apparent anesthesia complications

## 2019-03-20 NOTE — Anesthesia Procedure Notes (Signed)
Procedure Name: LMA Insertion Date/Time: 03/20/2019 1:58 PM Performed by: Alvera Novel, CRNA Pre-anesthesia Checklist: Patient identified, Emergency Drugs available, Suction available and Patient being monitored Patient Re-evaluated:Patient Re-evaluated prior to induction Oxygen Delivery Method: Circle System Utilized Preoxygenation: Pre-oxygenation with 100% oxygen Induction Type: IV induction LMA: LMA inserted LMA Size: 5.0 Number of attempts: 1 Placement Confirmation: positive ETCO2 Tube secured with: Tape Dental Injury: Teeth and Oropharynx as per pre-operative assessment

## 2019-03-20 NOTE — Progress Notes (Addendum)
PT Cancellation Note  Patient Details Name: Gabriel Howe MRN: [id number] DOB: [birth year]   Cancelled Treatment:    Reason Eval/Treat Not Completed: PT screened, no needs identified, will sign off. Spoke with patient who reports no acute PT or follow-up PT needs. Reports mod indep with mobility and owns necessary equipment. Offered to assist patient with OOB transfer, but pt politely declining to rest. Pt to clarify with MD regarding RLE WB restrictions with regards to BKA prosthetic wear. Acute PT will sign off. Please reconsult if new needs arise.  Ina Homes, PT, DPT Acute Rehabilitation Services  Pager [phone] Office [phone]  Malachy Chamber 03/20/2019, 5:24 PM

## 2019-03-20 NOTE — Op Note (Signed)
03/20/2019  3:46 PM  PATIENT:  Gabriel Howe    PRE-OPERATIVE DIAGNOSIS:  Right Prepatellar Bursitis, Osteomyelitis Right Index Finger  POST-OPERATIVE DIAGNOSIS:  Same  PROCEDURE:  IRRIGATION AND DEBRIDEMENT RIGHT PREPATELLAR BURSA, REVISION AMPUTATION RIGHT INDEX FINGER Local tissue rearrangement for wound closure 3 x 5 cm.  SURGEON:  Nadara Mustard, MD  PHYSICIAN ASSISTANT:None ANESTHESIA:   General  PREOPERATIVE INDICATIONS:  Gabriel Howe is a  58 y.o. male with a diagnosis of Right Prepatellar Bursitis, Osteomyelitis Right Index Finger who failed conservative measures and elected for surgical management.    The risks benefits and alternatives were discussed with the patient preoperatively including but not limited to the risks of infection, bleeding, nerve injury, cardiopulmonary complications, the need for revision surgery, among others, and the patient was willing to proceed.  OPERATIVE IMPLANTS: None.  @ENCIMAGES @  OPERATIVE FINDINGS: Large pre-patella bursal inflamed tissue with abscess.  Tissue and abscess was sent for tissue cultures.  Right index finger showed healthy viable bone at the amputation site middle phalanx.  OPERATIVE PROCEDURE: Patient was brought the operating room and underwent a general anesthetic.  After adequate levels anesthesia were obtained patient's right lower extremity and right upper extremity were prepped using DuraPrep draped into a sterile field a timeout was called.  Attention was first focused on the cleaner extremity the right upper extremity.  A fishmouth incision was made just proximal to the infected tissue.  This was carried down to bone.  The finger was resected just proximal to the skin incision.  There was good healthy bone at the amputation site.  Electrocautery was used for hemostasis the wound was irrigated with normal saline.  Local tissue rearrangement was used for wound closure.  Sterile dressing was applied attention was then focused  on the right knee which had purulent drainage.  A elliptical incision was made longitudinally around the ulcer this was carried down to the bursa.  The bursa was excised and this was sent for tissue cultures.  The wound was irrigated with normal saline electrocautery was used for hemostasis.  Local tissue rearrangement was used to close the wound 3 x 5 cm.  A sterile dressing was applied patient was extubated taken the PACU in stable condition.   DISCHARGE PLANNING:  Antibiotic duration: Continue IV antibiotics for at least 3 days and will discharge on oral antibiotics depending on the culture sensitivity.  Weightbearing: Patient is not to use his prosthesis on the right.  Pain medication: Opioid pathway ordered  Dressing care/ Wound VAC: Keep dressings clean dry and intact  Ambulatory devices: Walker  Discharge to: Anticipate discharge to home.  Follow-up: In the office 1 week post operative.

## 2019-03-20 NOTE — Plan of Care (Signed)

## 2019-03-20 NOTE — Anesthesia Preprocedure Evaluation (Signed)
Anesthesia Evaluation  Patient identified by MRN, date of birth, ID band Patient awake    Reviewed: Allergy & Precautions, NPO status , Patient's Chart, lab work & pertinent test results, reviewed documented beta blocker date and time   History of Anesthesia Complications Negative for: history of anesthetic complications  Airway Mallampati: III  TM Distance: >3 FB     Dental  (+) Dental Advisory Given, Teeth Intact   Pulmonary shortness of breath and with exertion, former smoker,    breath sounds clear to auscultation       Cardiovascular (-) hypertension(-) Past MI and (-) CHF + Valvular Problems/Murmurs  Rhythm:Regular     Neuro/Psych  Headaches,  Neuromuscular disease negative psych ROS   GI/Hepatic negative GI ROS, Neg liver ROS,   Endo/Other  diabetes, Poorly Controlled, Type 2  Renal/GU negative Renal ROS     Musculoskeletal  (+) Arthritis ,   Abdominal   Peds  Hematology negative hematology ROS (+)   Anesthesia Other Findings   Reproductive/Obstetrics negative OB ROS                             Anesthesia Physical  Anesthesia Plan  ASA: III  Anesthesia Plan: General   Post-op Pain Management:    Induction: Intravenous  PONV Risk Score and Plan: 2 and Ondansetron and Treatment may vary due to age or medical condition  Airway Management Planned: LMA and Oral ETT  Additional Equipment: None  Intra-op Plan:   Post-operative Plan: Extubation in OR  Informed Consent: I have reviewed the patients History and Physical, chart, labs and discussed the procedure including the risks, benefits and alternatives for the proposed anesthesia with the patient or authorized representative who has indicated his/her understanding and acceptance.     Dental advisory given  Plan Discussed with: CRNA, Surgeon and Anesthesiologist  Anesthesia Plan Comments:         Anesthesia Quick  Evaluation  

## 2019-03-20 NOTE — H&P (Signed)
Gabriel Howe is an 58 y.o. male.   Chief Complaint: Right knee prepatellar bursal infection and infection/osteomyelitis of the tuft of the right index finger HPI: The patient is a 58 year old gentleman who is status post bilateral transtibial amputations who developed cellulitis and purulent drainage from the right knee prepatellar bursa.  He normally ambulates with bilateral prosthetics.   He also had sustained a burn to his right index finger and long finger and underwent partial amputation of the long finger at the DIP joint and treated conservatively with the index finger.  He has developed ulceration and cellulitis and pain of the index finger and complete destruction of the tuft of the index finger from osteomyelitis on radiographs. He presents for irrigation debridement of the right prepatellar bursal infection and amputation of the right index finger just proximal to the DIP joint.  Past Medical History:  Diagnosis Date  . Arthritis    "ankles and feet" (08/19/2013)  . Bronchitis    hx of;last time about 5354yrs ago  . Chronic back pain   . Exertional shortness of breath   . JXBJYNWG(956.2Headache(784.0)    "monthly" (08/19/2013)  . Heart murmur    Hx: of as a child  . Hyperlipidemia    takes Atorvastatin daily  . Neuromuscular disorder (HCC)    diabetic neuropathy in feet  . Type II diabetes mellitus (HCC)    takes Metformin bid and Lantus nightly  . Wears glasses     Past Surgical History:  Procedure Laterality Date  . AMPUTATION Right 01/02/2013   Procedure: AMPUTATION BELOW KNEE;  Surgeon: Nadara MustardMarcus V Duda, MD;  Location: [facility] OR;  Service: Orthopedics;  Laterality: Right;  Right Below Knee Amputation  . AMPUTATION Left 04/25/2013   Procedure: AMPUTATION RAY;  Surgeon: Nadara MustardMarcus V Duda, MD;  [facility];  Service: Orthopedics;  Laterality: Left;  Left Foot 3rd Ray Amputation  . AMPUTATION Left 08/03/2013   Procedure: Left Midfoot Amputation;  Surgeon: Nadara MustardMarcus V Duda, MD;  [facility];   Service: Orthopedics;  Laterality: Left;  Left Foot Transmetatarsal Amputation  . AMPUTATION Left 08/19/2013   Procedure: AMPUTATION BELOW KNEE- left;  Surgeon: Nadara MustardMarcus V Duda, MD;  Location: [facility] OR;  Service: Orthopedics;  Laterality: Left;  Left Below Knee Amputation  . AMPUTATION Right 05/16/2015   Procedure: AMPUTATION BELOW KNEE/REVISION;  Surgeon: Nadara MustardMarcus Duda V, MD;  Location: [facility] OR;  Service: Orthopedics;  Laterality: Right;  . AMPUTATION     middle finger right hand  . BELOW KNEE LEG AMPUTATION Left 08/19/2013  . BELOW KNEE LEG AMPUTATION Right 12/2012  . ESOPHAGOGASTRODUODENOSCOPY    . IRRIGATION AND DEBRIDEMENT KNEE    . STUMP REVISION Right 05/16/2015  . TOE AMPUTATION Right 2012   at [facility], and removal of partial of second to 2012  . TONSILLECTOMY      Family History  Problem Relation Age of Onset  . Other Mother   . Other Father    Social History:  reports that he has quit smoking. His smoking use included cigarettes. He quit after 2.00 years of use. He has never used smokeless tobacco. He reports previous alcohol use. He reports current drug use.  Allergies:  Allergies  Allergen Reactions  . Bactroban [Mupirocin Calcium] Hives and Swelling    SWELLING REACTION UNSPECIFIED   . Sulfamethoxazole-Trimethoprim Hives  . Shellfish Allergy Rash    No medications prior to admission.    No results found for this or any previous visit (from the past 48  hour(s)). No results found.  ROS  There were no vitals taken for this visit. Physical Exam  Constitutional: He is oriented to person, place, and time. He appears well-developed and well-nourished. No distress.  HENT:  Head: Normocephalic and atraumatic.  Neck: No tracheal deviation present. No thyromegaly present.  Cardiovascular: Normal rate and regular rhythm.  Respiratory: Effort normal. No stridor. No respiratory distress.  GI: Soft. He exhibits no distension.  Musculoskeletal:     Comments: Examination the right  knee he has cellulitis of the prepatellar bursa.  There is an open wound about 5 mm in diameter with purulent drainage.  There is no ascending cellulitis there is no knee effusion no pain with range of motion of the knee.  His transtibial amputation is well-healed no ulceration.  Semination of the right index finger patient has ulceration of the finger from the DIP joint distally.  There is cellulitis up to the PIP joint.  The cellulitis is not tender to palpation.  Neurological: He is alert and oriented to person, place, and time. No cranial nerve deficit.  Skin: Skin is warm.  Psychiatric: He has a normal mood and affect. His behavior is normal. Judgment and thought content normal.     Assessment/Plan Right prepatellar bursal infection and cellulitis and osteomyelitis of the distal phalanx of the right index finger plan prepatellar bursal excision and wound closure and IV antibiotic therapy, and amputation of the right index finger just proximal to the DIP joint for osteomyelitis.  The procedure and possible benefits and risks were discussed with the patient including the risk of bleeding, infection, neurovascular injury, and possible need for further surgery were discussed and the patient's questions answered to his satisfaction.  The patient wishes to proceed with surgery at this time.  Lazaro Arms, PA-C 03/20/2019, 7:14 AM [facility] [phone]

## 2019-03-21 NOTE — Anesthesia Postprocedure Evaluation (Signed)
Anesthesia Post Note  Patient: Gabriel Howe  Procedure(s) Performed: IRRIGATION AND DEBRIDEMENT RIGHT PREPATELLAR BURSA (Right Leg Upper) REVISION AMPUTATION RIGHT INDEX FINGER (Right Finger)     Patient location during evaluation: PACU Anesthesia Type: General Level of consciousness: awake and alert Pain management: pain level controlled Vital Signs Assessment: post-procedure vital signs reviewed and stable Respiratory status: spontaneous breathing, nonlabored ventilation, respiratory function stable and patient connected to nasal cannula oxygen Cardiovascular status: blood pressure returned to baseline and stable Postop Assessment: no apparent nausea or vomiting Anesthetic complications: no    Last Vitals:  Vitals:   03/21/19 0511 03/21/19 1302  BP: (!) 140/94 (!) 127/94  Pulse: 76 84  Resp: 18 18  Temp: 36.5 C 36.5 C  SpO2: 96% 96%    Last Pain:  Vitals:   03/21/19 1302  TempSrc: Oral  PainSc:                  Garold Sheeler

## 2019-03-21 NOTE — Progress Notes (Signed)
Patient ID: Gabriel Howe, male   DOB: 07/29/1961, 58 y.o.   MRN: [id number] Postoperative day 1 partial amputation right index finger and excision of prepatellar bursa right knee.  Patient states he is asymptomatic this morning.  Cultures are pending Gram stain is negative.  He is on Kefzol.  Plan for discharge when cultures are finalized.

## 2019-03-21 NOTE — Plan of Care (Signed)

## 2019-03-22 ENCOUNTER — Encounter (HOSPITAL_COMMUNITY): Payer: Self-pay | Admitting: Orthopedic Surgery

## 2019-03-22 NOTE — Progress Notes (Signed)
Subjective: 2 Days Post-Op Procedure(s) (LRB): IRRIGATION AND DEBRIDEMENT RIGHT PREPATELLAR BURSA (Right) REVISION AMPUTATION RIGHT INDEX FINGER (Right) Patient reports pain as mild.    Objective: Vital signs in last 24 hours: Temp:  [97.7 F (36.5 C)-98.5 F (36.9 C)] 98.1 F (36.7 C) (05/24 0448) Pulse Rate:  [84-90] 90 (05/24 0448) Resp:  [16-20] 16 (05/24 0448) BP: (127-145)/(87-94) 129/94 (05/24 0448) SpO2:  [96 %-98 %] 97 % (05/24 0448)  Intake/Output from previous day: 05/23 0701 - 05/24 0700 In: 1575 [P.O.:1200; I.V.:75; IV Piggyback:300] Out: 750 [Urine:750] Intake/Output this shift: No intake/output data recorded.  Recent Labs    03/20/19 1055  HGB 13.5   Recent Labs    03/20/19 1055  WBC 13.0*  RBC 5.28  HCT 43.2  PLT 333   Recent Labs    03/20/19 1055  NA 137  K 4.4  CL 105  CO2 20*  BUN 22*  CREATININE 1.11  GLUCOSE 214*  CALCIUM 9.3   No results for input(s): LABPT, INR in the last 72 hours.  Right lower leg and right index finger operative dressings in place and without drainage.    Assessment/Plan: 2 Days Post-Op Procedure(s) (LRB): IRRIGATION AND DEBRIDEMENT RIGHT PREPATELLAR BURSA (Right) REVISION AMPUTATION RIGHT INDEX FINGER (Right) Operative cultures pending, gram stain without organisms.  Continues IV ancef and plan continue IV antibiotics through Tuesday.  Can likely DC home on Tuesday with oral antibiotics.  Pain much improved following debridement.   Lazaro Arms, PA-C 03/22/2019, 8:39 AM  [facility] [phone]

## 2019-03-22 NOTE — Progress Notes (Signed)
Called and spoke to Dr. Lajoyce Corners and told him about tissue cultures coming back from lab (they had called me):  Rare Group B strep and Rare Streptococcus Constellatus.  No changes in antibiotics at this time.

## 2019-03-23 MED ORDER — SODIUM CHLORIDE 0.9 % IV SOLN
3.0000 g | Freq: Four times a day (QID) | INTRAVENOUS | Status: DC
  Administered 2019-03-23 – 2019-03-24 (×4): 3 g via INTRAVENOUS
  Filled 2019-03-23 (×6): qty 3

## 2019-03-23 NOTE — Progress Notes (Signed)
Patient ID: Gabriel Howe, male   DOB: 09-22-1961, 58 y.o.   MRN: [id number] His cultures did grow out strep.  Will start Unasyn for the next 24 hours.  His right knee dressing is clean and dry.  He feels ok.  Vitals are stable.

## 2019-03-23 NOTE — Plan of Care (Signed)
  Problem: Coping: Goal: Level of anxiety will decrease Outcome: Progressing   Problem: Safety: Goal: Ability to remain free from injury will improve Outcome: Progressing   Problem: Skin Integrity: Goal: Risk for impaired skin integrity will decrease Outcome: Progressing   

## 2019-03-24 LAB — GLUCOSE, CAPILLARY
Glucose-Capillary: 221 mg/dL — ABNORMAL HIGH (ref 70–99)
Glucose-Capillary: 179 mg/dL — ABNORMAL HIGH (ref 70–99)
Glucose-Capillary: 175 mg/dL — ABNORMAL HIGH (ref 70–99)
Glucose-Capillary: 189 mg/dL — ABNORMAL HIGH (ref 70–99)
Glucose-Capillary: 142 mg/dL — ABNORMAL HIGH (ref 70–99)
Glucose-Capillary: 165 mg/dL — ABNORMAL HIGH (ref 70–99)
Glucose-Capillary: 185 mg/dL — ABNORMAL HIGH (ref 70–99)
Glucose-Capillary: 200 mg/dL — ABNORMAL HIGH (ref 70–99)
Glucose-Capillary: 185 mg/dL — ABNORMAL HIGH (ref 70–99)
Glucose-Capillary: 188 mg/dL — ABNORMAL HIGH (ref 70–99)
Glucose-Capillary: 168 mg/dL — ABNORMAL HIGH (ref 70–99)
Glucose-Capillary: 166 mg/dL — ABNORMAL HIGH (ref 70–99)
Glucose-Capillary: 169 mg/dL — ABNORMAL HIGH (ref 70–99)

## 2019-03-24 MED ORDER — DOXYCYCLINE HYCLATE 100 MG PO TABS: 100.0000 mg | ORAL_TABLET | Freq: Two times a day (BID) | ORAL | 0 refills | Status: DC

## 2019-03-24 NOTE — Progress Notes (Signed)
Discharged pt to home. Instructions given and explained.Belongings returned. 

## 2019-03-24 NOTE — Discharge Summary (Signed)
Discharge Diagnoses:  Active Problems:   Abscess of bursa of right knee   Surgeries: Procedure(s): IRRIGATION AND DEBRIDEMENT RIGHT PREPATELLAR BURSA REVISION AMPUTATION RIGHT INDEX FINGER on 03/20/2019    Consultants:   Discharged Condition: Improved  Hospital Course: Gabriel Howe is an 58 y.o. male who was admitted 03/20/2019 with a chief complaint of abscess right prepatellar bursitis and osteomyelitis right index finger, with a final diagnosis of Right Prepatellar Bursitis, Osteomyelitis Right Index Finger.  Patient was brought to the operating room on 03/20/2019 and underwent Procedure(s): IRRIGATION AND DEBRIDEMENT RIGHT PREPATELLAR BURSA REVISION AMPUTATION RIGHT INDEX FINGER.    Patient was given perioperative antibiotics:  Anti-infectives (From admission, onward)   Start     Dose/Rate Route Frequency Ordered Stop   03/24/19 0000  doxycycline (VIBRA-TABS) 100 MG tablet     100 mg Oral 2 times daily 03/24/19 0735     03/23/19 1000  Ampicillin-Sulbactam (UNASYN) 3 g in sodium chloride 0.9 % 100 mL IVPB     3 g 200 mL/hr over 30 Minutes Intravenous Every 6 hours 03/23/19 0934     03/20/19 1615  ceFAZolin (ANCEF) IVPB 2g/100 mL premix     2 g 200 mL/hr over 30 Minutes Intravenous Every 8 hours 03/20/19 1609 03/23/19 0705   03/20/19 1300  ceFAZolin (ANCEF) 3 g in dextrose 5 % 50 mL IVPB     3 g 100 mL/hr over 30 Minutes Intravenous To [facility]Stay [facility] 03/19/19 0845 03/20/19 1400    .  Patient was given sequential compression devices, early ambulation, and aspirin for DVT prophylaxis.  Recent vital signs:  Patient Vitals for the past 24 hrs:  BP Temp Temp src Pulse Resp SpO2  03/23/19 2108 (!) 146/84 98.6 F (37 C) Oral 85 18 98 %  03/23/19 1640 (!) 142/85 98.1 F (36.7 C) Oral 89 18 97 %  .  Recent laboratory studies: No results found.  Discharge Medications:   Allergies as of 03/24/2019      Reactions   Bactroban [mupirocin Calcium] Hives, Swelling   SWELLING  REACTION UNSPECIFIED    Sulfamethoxazole-trimethoprim Hives   Shellfish Allergy Rash      Medication List    STOP taking these medications   cadexomer iodine 0.9 % gel Commonly known as:  IODOSORB     TAKE these medications   atorvastatin 40 MG tablet Commonly known as:  LIPITOR Take 40 mg by mouth at bedtime.   doxycycline 100 MG tablet Commonly known as:  VIBRA-TABS Take 1 tablet (100 mg total) by mouth 2 (two) times daily.   metFORMIN 1000 MG tablet Commonly known as:  GLUCOPHAGE Take 1,000 mg by mouth 2 (two) times daily.   pioglitazone 15 MG tablet Commonly known as:  ACTOS Take 15 mg by mouth daily.   Gabriel Howe FlexTouch 200 UNIT/ML Sopn Generic drug:  Insulin Degludec Inject 50 Units into the skin every morning.       Diagnostic Studies: Xr Finger Index Right  Result Date: 03/17/2019 2 view radiographs of the right index fingers shows complete destruction of the tuft of the index finger from osteomyelitis.   Patient benefited maximally from their hospital stay and there were no complications.     Disposition: Discharge disposition: 01-Home or Self Care      Discharge Instructions    Call MD / Call 911   Complete by:  As directed    If you experience chest pain or shortness of breath, CALL 911 and be transported to the hospital  emergency room.  If you develope a fever above 101 F, pus (white drainage) or increased drainage or redness at the wound, or calf pain, call your surgeon's office.   Constipation Prevention   Complete by:  As directed    Drink plenty of fluids.  Prune juice may be helpful.  You may use a stool softener, such as Colace (over the counter) 100 mg twice a day.  Use MiraLax (over the counter) for constipation as needed.   Diet - low sodium heart healthy   Complete by:  As directed    Increase activity slowly as tolerated   Complete by:  As directed      Follow-up Information    Nadara Mustard, MD In 1 week.   Specialty:   Orthopedic Surgery Contact information: [address] [place] [address] [phone]            Signed: Nadara Mustard 03/24/2019, 7:35 AM

## 2019-03-25 LAB — AEROBIC/ANAEROBIC CULTURE W GRAM STAIN (SURGICAL/DEEP WOUND)

## 2019-04-02 ENCOUNTER — Encounter: Payer: Self-pay | Admitting: Orthopedic Surgery

## 2019-04-02 ENCOUNTER — Ambulatory Visit (INDEPENDENT_AMBULATORY_CARE_PROVIDER_SITE_OTHER): Payer: Medicare Other | Admitting: Orthopedic Surgery

## 2019-04-02 ENCOUNTER — Other Ambulatory Visit: Payer: Self-pay

## 2019-04-02 VITALS — Ht 76.0 in | Wt 325.0 lb

## 2019-04-02 DIAGNOSIS — M7041 Prepatellar bursitis, right knee: Secondary | ICD-10-CM

## 2019-04-02 DIAGNOSIS — M869 Osteomyelitis, unspecified: Secondary | ICD-10-CM

## 2019-04-02 NOTE — Progress Notes (Signed)
Office Visit Note   Patient: Gabriel Howe           Date of Birth: [birth year]           MRN: [id number] Visit Date: 04/02/2019              Requested by: No referring provider defined for this encounter. PCP: System, Pcp Not In  Chief Complaint  Patient presents with  . Right Knee - Routine Post Op    03/20/19 I&D right prepatellar bursa       HPI: Patient is a 58 year old gentleman status post excision pre-patella bursitis right knee cultures were positive for group B strep pansensitive patient is on 1 month of doxycycline orally after discharge.  Patient states he has had no trouble with the index finger amputation.  He complains of serosanguineous drainage from the knee incision.  Assessment & Plan: Visit Diagnoses:  1. Prepatellar bursitis of right knee   2. Osteomyelitis of finger of right hand (HCC)     Plan: Patient will continue the doxycycline continue with Dial soap cleansing dry dressing changes to the right knee continue to not wear his prosthesis.  Sutures harvested from the finger.  Follow-Up Instructions: Return in about 2 weeks (around 04/16/2019).   Ortho Exam  Patient is alert, oriented, no adenopathy, well-dressed, normal affect, normal respiratory effort. Examination the right index finger incision is healed well there is no redness no cellulitis no drainage no signs of infection we will harvest the sutures.  Examination of pre-patella bursal incision there is an open area 2 cm x 3 mm there is a resolving hematoma with serosanguineous drainage no purulence.  The cellulitis is almost completely resolved.  Cultures were positive for group B strep.  Patient currently on doxycycline.  Imaging: No results found. No images are attached to the encounter.  Labs: Lab Results  Component Value Date   HGBA1C 7.2 (H) 05/15/2015   HGBA1C 9.0 (H) 08/01/2013   HGBA1C 12.5 (H) 12/17/2012   REPTSTATUS 03/25/2019 FINAL 03/20/2019   GRAMSTAIN  03/20/2019    ABUNDANT  WBC PRESENT,BOTH PMN AND MONONUCLEAR NO ORGANISMS SEEN    CULT  03/20/2019    RARE GROUP B STREP(S.AGALACTIAE)ISOLATED TESTING AGAINST S. AGALACTIAE NOT ROUTINELY PERFORMED DUE TO PREDICTABILITY OF AMP/PEN/VAN SUSCEPTIBILITY. RARE STREPTOCOCCUS CONSTELLATUS RESULT CALLED TO, READ BACK BY AND VERIFIED WITH: Enrigue Catena [id number] MLM NO ANAEROBES ISOLATED Performed at [facility], [address] [place], [place] [address]      Lab Results  Component Value Date   ALBUMIN 3.2 (L) 03/20/2019   ALBUMIN 1.9 (L) 05/16/2015   ALBUMIN 2.6 (L) 08/19/2013    Body mass index is 39.56 kg/m.  Orders:  No orders of the defined types were placed in this encounter.  No orders of the defined types were placed in this encounter.    Procedures: No procedures performed  Clinical Data: No additional findings.  ROS:  All other systems negative, except as noted in the HPI. Review of Systems  Objective: Vital Signs: Ht  (1.93 m)   Wt (!) 325 lb (147.4 kg)   BMI 39.56 kg/m   Specialty Comments:  No specialty comments available.  PMFS History: Patient Active Problem List   Diagnosis Date Noted  . Abscess of bursa of right knee   . Osteomyelitis of finger of right hand (HCC) 03/17/2019  . Non-pressure chronic ulcer of right calf, limited to breakdown of skin (HCC) 10/03/2016  . Status post bilateral  below knee amputation (HCC) 05/15/2015  . Infection of below knee amputation stump (HCC) 05/15/2015  . Infection of amputation stump of right lower extremity (HCC) 05/15/2015  . Wound infection 05/15/2015  . Diabetic osteomyelitis (HCC) 08/05/2013  . Cellulitis and abscess of toe of left foot 08/01/2013  . Hyponatremia 08/01/2013  . Hypokalemia 08/01/2013  . Sepsis associated hypotension (HCC) 08/01/2013  . Leukocytosis 08/01/2013  . Cellulitis and abscess of lower leg 12/17/2012  . DM foot ulcer (HCC) 12/17/2012  . DM2 (diabetes mellitus, type 2) (HCC) 12/17/2012  .  Diabetic neuropathy (HCC) 12/17/2012   Past Medical History:  Diagnosis Date  . Arthritis    "ankles and feet" (08/19/2013)  . Bronchitis    hx of;last time about 53yrs ago  . Chronic back pain   . Exertional shortness of breath   . VJDYNXGZ(358.2)    "monthly" (08/19/2013)  . Heart murmur    Hx: of as a child  . Hyperlipidemia    takes Atorvastatin daily  . Neuromuscular disorder (HCC)    diabetic neuropathy in feet  . Type II diabetes mellitus (HCC)    takes Metformin bid and Lantus nightly  . Wears glasses     Family History  Problem Relation Age of Onset  . Other Mother   . Other Father     Past Surgical History:  Procedure Laterality Date  . AMPUTATION Right 01/02/2013   Procedure: AMPUTATION BELOW KNEE;  Surgeon: Nadara Mustard, MD;  [facility];  Service: Orthopedics;  Laterality: Right;  Right Below Knee Amputation  . AMPUTATION Left 04/25/2013   Procedure: AMPUTATION RAY;  Surgeon: Nadara Mustard, MD;  Location: [facility] OR;  Service: Orthopedics;  Laterality: Left;  Left Foot 3rd Ray Amputation  . AMPUTATION Left 08/03/2013   Procedure: Left Midfoot Amputation;  Surgeon: Nadara Mustard, MD;  Location: [facility] OR;  Service: Orthopedics;  Laterality: Left;  Left Foot Transmetatarsal Amputation  . AMPUTATION Left 08/19/2013   Procedure: AMPUTATION BELOW KNEE- left;  Surgeon: Nadara Mustard, MD;  [facility] OR;  Service: Orthopedics;  Laterality: Left;  Left Below Knee Amputation  . AMPUTATION Right 05/16/2015   Procedure: AMPUTATION BELOW KNEE/REVISION;  Surgeon: Nadara Mustard, MD;  Location: [facility] OR;  Service: Orthopedics;  Laterality: Right;  . AMPUTATION     middle finger right hand  . AMPUTATION Right 03/20/2019   Procedure: REVISION AMPUTATION RIGHT INDEX FINGER;  Surgeon: Nadara Mustard, MD;  Location[facility];  Service: Orthopedics;  Laterality: Right;  . BELOW KNEE LEG AMPUTATION Left 08/19/2013  . BELOW KNEE LEG AMPUTATION Right 12/2012  . ESOPHAGOGASTRODUODENOSCOPY    . I&D  EXTREMITY Right 03/20/2019   Procedure: IRRIGATION AND DEBRIDEMENT RIGHT PREPATELLAR BURSA;  Surgeon: Nadara Mustard, MD;  Location: [facility] OR;  Service: Orthopedics;  Laterality: Right;  . IRRIGATION AND DEBRIDEMENT KNEE    . STUMP REVISION Right 05/16/2015  . TOE AMPUTATION Right 2012   at [facility], and removal of partial of second to 2012  . TONSILLECTOMY     Social History   Occupational History  . Not on file  Tobacco Use  . Smoking status: Former Smoker    Years: 2.00    Types: Cigarettes  . Smokeless tobacco: Never Used  . Tobacco comment: quit smoking cigarettes in the 80's  Substance and Sexual Activity  . Alcohol use: Not Currently    Comment:  "couple drinks maybe once/month"  . Drug use: Yes    Comment:  "  recreational drugs in the 80's "  . Sexual activity: Not Currently

## 2019-04-16 ENCOUNTER — Ambulatory Visit (INDEPENDENT_AMBULATORY_CARE_PROVIDER_SITE_OTHER): Payer: Medicare Other | Admitting: Orthopedic Surgery

## 2019-04-16 ENCOUNTER — Encounter: Payer: Self-pay | Admitting: Orthopedic Surgery

## 2019-04-16 ENCOUNTER — Other Ambulatory Visit: Payer: Self-pay

## 2019-04-16 VITALS — Ht 76.0 in | Wt 325.0 lb

## 2019-04-16 DIAGNOSIS — M7041 Prepatellar bursitis, right knee: Secondary | ICD-10-CM

## 2019-04-23 ENCOUNTER — Encounter: Payer: Self-pay | Admitting: Orthopedic Surgery

## 2019-04-23 NOTE — Progress Notes (Signed)
Office Visit Note   Patient: Gabriel Howe           Date of Birth: 09-21-1961           MRN: [id number] Visit Date: 04/16/2019              Requested by: No referring provider defined for this encounter. PCP: System, Pcp Not In  Chief Complaint  Patient presents with  . Right Knee - Routine Post Op    03/20/19 I&D prepatellar bursa   . Right Hand - Routine Post Op    03/20/19 revision amputation right index finger.       HPI: Patient is a 58 year old gentleman who presents follow-up status post irrigation and debridement and excision of infected prepatellar bursa on the right knee.  Patient is also status post revision amputation right index finger.  Patient states he still has some drainage has completed his course of doxycycline.  Assessment & Plan: Visit Diagnoses:  1. Prepatellar bursitis of right knee     Plan: Iodosorb was applied to the wound he will continue to not use his prosthesis until this is completely healed.  No restriction with the right hand.  Follow-Up Instructions: Return in about 3 weeks (around 05/07/2019).   Ortho Exam  Patient is alert, oriented, no adenopathy, well-dressed, normal affect, normal respiratory effort. Examination the right index finger incision is well-healed he has no redness no cellulitis he has good function.  Semination of the right knee the residual wound is 10 mm in diameter with healthy granulation tissue.  We will continue with dressing changes Dial soap cleansing not using the prosthesis.  Imaging: No results found. No images are attached to the encounter.  Labs: Lab Results  Component Value Date   HGBA1C 7.2 (H) 05/15/2015   HGBA1C 9.0 (H) 08/01/2013   HGBA1C 12.5 (H) 12/17/2012   REPTSTATUS 03/25/2019 FINAL 03/20/2019   GRAMSTAIN  03/20/2019    ABUNDANT WBC PRESENT,BOTH PMN AND MONONUCLEAR NO ORGANISMS SEEN    CULT  03/20/2019    RARE GROUP B STREP(S.AGALACTIAE)ISOLATED TESTING AGAINST S. AGALACTIAE NOT ROUTINELY  PERFORMED DUE TO PREDICTABILITY OF AMP/PEN/VAN SUSCEPTIBILITY. RARE STREPTOCOCCUS CONSTELLATUS RESULT CALLED TO, READ BACK BY AND VERIFIED WITH: Enrigue CatenaJ MOORE [phone] MLM NO ANAEROBES ISOLATED Performed at [facility], [address] [place], [place] [address]      Lab Results  Component Value Date   ALBUMIN 3.2 (L) 03/20/2019   ALBUMIN 1.9 (L) 05/16/2015   ALBUMIN 2.6 (L) 08/19/2013    Body mass index is 39.56 kg/m.  Orders:  No orders of the defined types were placed in this encounter.  No orders of the defined types were placed in this encounter.    Procedures: No procedures performed  Clinical Data: No additional findings.  ROS:  All other systems negative, except as noted in the HPI. Review of Systems  Objective: Vital Signs: Ht 6\' 4"  (1.93 m)   Wt (!) 325 lb (147.4 kg)   BMI 39.56 kg/m   Specialty Comments:  No specialty comments available.  PMFS History: Patient Active Problem List   Diagnosis Date Noted  . Abscess of bursa of right knee   . Osteomyelitis of finger of right hand (HCC) 03/17/2019  . Non-pressure chronic ulcer of right calf, limited to breakdown of skin (HCC) 10/03/2016  . Status post bilateral below knee amputation (HCC) 05/15/2015  . Infection of below knee amputation stump (HCC) 05/15/2015  . Infection of amputation stump of right  lower extremity ([place]) 05/15/2015  . Wound infection 05/15/2015  . Diabetic osteomyelitis ([place]) 08/05/2013  . Cellulitis and abscess of toe of left foot 08/01/2013  . Hyponatremia 08/01/2013  . Hypokalemia 08/01/2013  . Sepsis associated hypotension ([place]) 08/01/2013  . Leukocytosis 08/01/2013  . Cellulitis and abscess of lower leg 12/17/2012  . DM foot ulcer ([place]) 12/17/2012  . DM2 (diabetes mellitus, type 2) ([place]) 12/17/2012  . Diabetic neuropathy ([place]) 12/17/2012   Past Medical History:  Diagnosis Date  . Arthritis    "ankles and feet" (08/19/2013)  . Bronchitis    hx of;last time about  49yrs ago  . Chronic back pain   . Exertional shortness of breath   . NOMVEHMC(947.0)    "monthly" (08/19/2013)  . Heart murmur    Hx: of as a child  . Hyperlipidemia    takes Atorvastatin daily  . Neuromuscular disorder ([place])    diabetic neuropathy in feet  . Type II diabetes mellitus ([place])    takes Metformin bid and Lantus nightly  . Wears glasses     Family History  Problem Relation Age of Onset  . Other Mother   . Other Father     Past Surgical History:  Procedure Laterality Date  . AMPUTATION Right 01/02/2013   Procedure: AMPUTATION BELOW KNEE;  Surgeon: Newt Minion, MD;  Location: [place];  Service: Orthopedics;  Laterality: Right;  Right Below Knee Amputation  . AMPUTATION Left 04/25/2013   Procedure: AMPUTATION RAY;  Surgeon: Newt Minion, MD;  Location: [place];  Service: Orthopedics;  Laterality: Left;  Left Foot 3rd Ray Amputation  . AMPUTATION Left 08/03/2013   Procedure: Left Midfoot Amputation;  Surgeon: Newt Minion, MD;  Location: [place];  Service: Orthopedics;  Laterality: Left;  Left Foot Transmetatarsal Amputation  . AMPUTATION Left 08/19/2013   Procedure: AMPUTATION BELOW KNEE- left;  Surgeon: Newt Minion, MD;  Location: [place];  Service: Orthopedics;  Laterality: Left;  Left Below Knee Amputation  . AMPUTATION Right 05/16/2015   Procedure: AMPUTATION BELOW KNEE/REVISION;  Surgeon: Newt Minion, MD;  Location: [place];  Service: Orthopedics;  Laterality: Right;  . AMPUTATION     middle finger right hand  . AMPUTATION Right 03/20/2019   Procedure: REVISION AMPUTATION RIGHT INDEX FINGER;  Surgeon: Newt Minion, MD;  Location: [place];  Service: Orthopedics;  Laterality: Right;  . BELOW KNEE LEG AMPUTATION Left 08/19/2013  . BELOW KNEE LEG AMPUTATION Right 12/2012  . ESOPHAGOGASTRODUODENOSCOPY    . I&D EXTREMITY Right 03/20/2019   Procedure: IRRIGATION AND DEBRIDEMENT RIGHT PREPATELLAR BURSA;  Surgeon: Newt Minion, MD;  Location: [place];  Service: Orthopedics;   Laterality: Right;  . IRRIGATION AND DEBRIDEMENT KNEE    . STUMP REVISION Right 05/16/2015  . TOE AMPUTATION Right 2012   at [place], and removal of partial of second to 2012  . TONSILLECTOMY     Social History   Occupational History  . Not on file  Tobacco Use  . Smoking status: Former Smoker    Years: 2.00    Types: Cigarettes  . Smokeless tobacco: Never Used  . Tobacco comment: quit smoking cigarettes in the 80's  Substance and Sexual Activity  . Alcohol use: Not Currently    Comment:  "couple drinks maybe once/month"  . Drug use: Yes    Comment:  "recreational drugs in the 80's "  . Sexual activity: Not Currently

## 2019-05-04 ENCOUNTER — Encounter: Payer: Self-pay | Admitting: Orthopedic Surgery

## 2019-05-04 ENCOUNTER — Other Ambulatory Visit: Payer: Self-pay

## 2019-05-04 ENCOUNTER — Ambulatory Visit (INDEPENDENT_AMBULATORY_CARE_PROVIDER_SITE_OTHER): Payer: Medicare Other | Admitting: Physician Assistant

## 2019-05-04 VITALS — Ht 76.0 in | Wt 325.0 lb

## 2019-05-04 DIAGNOSIS — Z89511 Acquired absence of right leg below knee: Secondary | ICD-10-CM

## 2019-05-04 DIAGNOSIS — M7041 Prepatellar bursitis, right knee: Secondary | ICD-10-CM

## 2019-05-04 NOTE — Progress Notes (Signed)
Office Visit Note   Patient: Gabriel Howe           Date of Birth: May 21, 1961           MRN: [id number] Visit Date: 05/04/2019              Requested by: No referring provider defined for this encounter. PCP: System, Pcp Not In  Chief Complaint  Patient presents with  . Right Knee - Routine Post Op  . Right Hand - Routine Post Op      HPI: The patient is a 58 yo gentleman who is seen for post operative follow up following debridement of the right prepatellar bursa and revision of right index distal finger amputation on 03/20/2019.  He has a small residual ulcer over the right knee and has been using iodosorb ointment to the area. His prosthesis was fabricated by [employer] and he has not been wearing the prosthesis except to use while driving to get to and from work.   Assessment & Plan: Visit Diagnoses:  1. Prepatellar bursitis of right knee   2. 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 Prescription for 4 XL shrinker sock for right transtibial amputation site and apply directly over skin. May use prosthesis to drive to and from work only.  Follow up in 3 weeks.   Follow-Up Instructions: Return in about 3 weeks (around 05/25/2019).   Ortho Exam  Patient is alert, oriented, no adenopathy, well-dressed, normal affect, normal respiratory effort. The ulcer over the pre patellar area ~ 3 cm diameter and 3 mm depth without undermining , 75 % pink tissue and 25 % yellow slough in wound bed which was debrided with gauze to bleeding tissue. No signs of infection or cellulitis. There is a small ulcer over the distal residual limb ~ 1 cm diameter without signs of infection or cellulitis. There is moderate edema over the residual limb.  The right index finger amputation over the distal tip is well healed. No signs of infection or cellulitis.   Imaging: No results found. No images are attached to the encounter.   Labs: Lab Results  Component Value Date   HGBA1C 7.2 (H) 05/15/2015   HGBA1C 9.0 (H) 08/01/2013   HGBA1C 12.5 (H) 12/17/2012   REPTSTATUS 03/25/2019 FINAL 03/20/2019   GRAMSTAIN  03/20/2019    ABUNDANT WBC PRESENT,BOTH PMN AND MONONUCLEAR NO ORGANISMS SEEN    CULT  03/20/2019    RARE GROUP B STREP(S.AGALACTIAE)ISOLATED TESTING AGAINST S. AGALACTIAE NOT ROUTINELY PERFORMED DUE TO PREDICTABILITY OF AMP/PEN/VAN SUSCEPTIBILITY. RARE STREPTOCOCCUS CONSTELLATUS RESULT CALLED TO, READ BACK BY AND VERIFIED WITH: Enrigue CatenaJ MOORE [phone] MLM NO ANAEROBES ISOLATED Performed at [facility], [address] [place], [place] [address]      Lab Results  Component Value Date   ALBUMIN 3.2 (L) 03/20/2019   ALBUMIN 1.9 (L) 05/16/2015   ALBUMIN 2.6 (L) 08/19/2013    Lab Results  Component Value Date   MG 2.0 08/03/2013   No results found for: VD25OH  No results found for: PREALBUMIN CBC EXTENDED Latest Ref Rng & Units 03/20/2019 05/20/2015 05/19/2015  WBC 4.0 - 10.5 K/uL 13.0(H) 8.8 9.0  RBC 4.22 - 5.81 MIL/uL 5.28 3.09(L) 2.51(L)  HGB 13.0 - 17.0 g/dL 29.513.5 6.2(Z8.2(L) 6.8(LL)  HCT 39.0 - 52.0 % 43.2 25.4(L) 21.2(L)  PLT 150 - 400 K/uL 333 374 378  NEUTROABS 1.7 - 7.7  K/uL 8.1(H) - -  LYMPHSABS 0.7 - 4.0 K/uL 3.6 - -     Body mass index is 39.56 kg/m.  Orders:  No orders of the defined types were placed in this encounter.  No orders of the defined types were placed in this encounter.    Procedures: No procedures performed  Clinical Data: No additional findings.  ROS:  All other systems negative, except as noted in the HPI. Review of Systems  Objective: Vital Signs: Ht 6\' 4"  (1.93 m)   Wt (!) 325 lb (147.4 kg)   BMI 39.56 kg/m   Specialty Comments:  No specialty comments available.  PMFS History: Patient Active Problem List   Diagnosis Date Noted  . Abscess of bursa of right knee   . Osteomyelitis of finger of right hand (HCC) 03/17/2019  . Non-pressure  chronic ulcer of right calf, limited to breakdown of skin (HCC) 10/03/2016  . Status post bilateral below knee amputation (HCC) 05/15/2015  . Infection of below knee amputation stump (HCC) 05/15/2015  . Infection of amputation stump of right lower extremity (HCC) 05/15/2015  . Wound infection 05/15/2015  . Diabetic osteomyelitis (HCC) 08/05/2013  . Cellulitis and abscess of toe of left foot 08/01/2013  . Hyponatremia 08/01/2013  . Hypokalemia 08/01/2013  . Sepsis associated hypotension (HCC) 08/01/2013  . Leukocytosis 08/01/2013  . Cellulitis and abscess of lower leg 12/17/2012  . DM foot ulcer (HCC) 12/17/2012  . DM2 (diabetes mellitus, type 2) (HCC) 12/17/2012  . Diabetic neuropathy (HCC) 12/17/2012   Past Medical History:  Diagnosis Date  . Arthritis    "ankles and feet" (08/19/2013)  . Bronchitis    hx of;last time about 1264yrs ago  . Chronic back pain   . Exertional shortness of breath   . ZOXWRUEA(540.9Headache(784.0)    "monthly" (08/19/2013)  . Heart murmur    Hx: of as a child  . Hyperlipidemia    takes Atorvastatin daily  . Neuromuscular disorder (HCC)    diabetic neuropathy in feet  . Type II diabetes mellitus (HCC)    takes Metformin bid and Lantus nightly  . Wears glasses     Family History  Problem Relation Age of Onset  . Other Mother   . Other Father     Past Surgical History:  Procedure Laterality Date  . AMPUTATION Right 01/02/2013   Procedure: AMPUTATION BELOW KNEE;  Surgeon: Nadara MustardMarcus V Duda, MD;  [facility];  Service: Orthopedics;  Laterality: Right;  Right Below Knee Amputation  . AMPUTATION Left 04/25/2013   Procedure: AMPUTATION RAY;  Surgeon: Nadara MustardMarcus V Duda, MD;  [facility];  Service: Orthopedics;  Laterality: Left;  Left Foot 3rd Ray Amputation  . AMPUTATION Left 08/03/2013   Procedure: Left Midfoot Amputation;  Surgeon: Nadara MustardMarcus V Duda, MD;  [facility];  Service: Orthopedics;  Laterality: Left;  Left Foot Transmetatarsal Amputation  . AMPUTATION Left  08/19/2013   Procedure: AMPUTATION BELOW KNEE- left;  Surgeon: Nadara MustardMarcus V Duda, MD;  [facility];  Service: Orthopedics;  Laterality: Left;  Left Below Knee Amputation  . AMPUTATION Right 05/16/2015   Procedure: AMPUTATION BELOW KNEE/REVISION;  Surgeon: Nadara MustardMarcus Duda V, MD;  [facility];  Service: Orthopedics;  Laterality: Right;  . AMPUTATION     middle finger right hand  . AMPUTATION Right 03/20/2019   Procedure: REVISION AMPUTATION RIGHT INDEX FINGER;  Surgeon: Nadara Mustarduda, Marcus V, MD;  [facility];  Service: Orthopedics;  Laterality: Right;  . BELOW KNEE LEG AMPUTATION Left  08/19/2013  . BELOW KNEE LEG AMPUTATION Right 12/2012  . ESOPHAGOGASTRODUODENOSCOPY    . I&D EXTREMITY Right 03/20/2019   Procedure: IRRIGATION AND DEBRIDEMENT RIGHT PREPATELLAR BURSA;  Surgeon: Newt Minion, MD;  Location: [place];  Service: Orthopedics;  Laterality: Right;  . IRRIGATION AND DEBRIDEMENT KNEE    . STUMP REVISION Right 05/16/2015  . TOE AMPUTATION Right 2012   at [place], and removal of partial of second to 2012  . TONSILLECTOMY     Social History   Occupational History  . Not on file  Tobacco Use  . Smoking status: Former Smoker    Years: 2.00    Types: Cigarettes  . Smokeless tobacco: Never Used  . Tobacco comment: quit smoking cigarettes in the 80's  Substance and Sexual Activity  . Alcohol use: Not Currently    Comment:  "couple drinks maybe once/month"  . Drug use: Yes    Comment:  "recreational drugs in the 80's "  . Sexual activity: Not Currently

## 2019-05-21 ENCOUNTER — Ambulatory Visit (INDEPENDENT_AMBULATORY_CARE_PROVIDER_SITE_OTHER): Payer: Medicare Other | Admitting: Orthopedic Surgery

## 2019-05-21 ENCOUNTER — Encounter: Payer: Self-pay | Admitting: Orthopedic Surgery

## 2019-05-21 VITALS — Ht 76.0 in | Wt 325.0 lb

## 2019-05-21 DIAGNOSIS — Z89511 Acquired absence of right leg below knee: Secondary | ICD-10-CM

## 2019-05-21 DIAGNOSIS — M7041 Prepatellar bursitis, right knee: Secondary | ICD-10-CM

## 2019-05-25 ENCOUNTER — Encounter: Payer: Self-pay | Admitting: Orthopedic Surgery

## 2019-05-25 NOTE — Progress Notes (Signed)
Office Visit Note   Patient: Gabriel Howe           Date of Birth: [birth year]           MRN: [id number] Visit Date: 05/21/2019              Requested by: No referring provider defined for this encounter. PCP: System, Pcp Not In  Chief Complaint  Patient presents with  . Right Leg - Routine Post Op  . Right Knee - Routine Post Op    03/20/19 I&D right prepatellar bursa      HPI: Patient is a 58 year old gentleman status post debridement of an infected prepatellar bursitis right knee with a right transtibial amputation.  Patient feels like he is doing well feels like the wound is healing patient is currently wearing his prosthesis and drove himself to the office visit.  Patient is currently using Iodosorb he states he is allergic to Bactroban.  Assessment & Plan: Visit Diagnoses:  1. Prepatellar bursitis of right knee   2. 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  Iodosorb was applied plus a dry dressing reevaluate in 3 weeks  Follow-Up Instructions: Return in about 3 weeks (around 06/11/2019).   Ortho Exam  Patient is alert, oriented, no adenopathy, well-dressed, normal affect, normal respiratory effort. Examination there is good granulation tissue at the base of the wound no ascending cellulitis no exposed bone or tendon the wound is 10 mm in diameter 5 mm deep.  Imaging: No results found. No images are attached to the encounter.  Labs: Lab Results  Component Value Date   HGBA1C 7.2 (H) 05/15/2015   HGBA1C 9.0 (H) 08/01/2013   HGBA1C 12.5 (H) 12/17/2012   REPTSTATUS 03/25/2019 FINAL 03/20/2019   GRAMSTAIN  03/20/2019    ABUNDANT WBC PRESENT,BOTH PMN AND MONONUCLEAR NO ORGANISMS SEEN    CULT  03/20/2019    RARE GROUP B STREP(S.AGALACTIAE)ISOLATED TESTING AGAINST S. AGALACTIAE NOT ROUTINELY PERFORMED DUE TO PREDICTABILITY OF  AMP/PEN/VAN SUSCEPTIBILITY. RARE STREPTOCOCCUS CONSTELLATUS RESULT CALLED TO, READ BACK BY AND VERIFIED WITH: Enrigue CatenaJ MOORE [phone] MLM NO ANAEROBES ISOLATED Performed at [facility], [address] [place], [place] [address]      Lab Results  Component Value Date   ALBUMIN 3.2 (L) 03/20/2019   ALBUMIN 1.9 (L) 05/16/2015   ALBUMIN 2.6 (L) 08/19/2013    Lab Results  Component Value Date   MG 2.0 08/03/2013   No results found for: VD25OH  No results found for: PREALBUMIN CBC EXTENDED Latest Ref Rng & Units 03/20/2019 05/20/2015 05/19/2015  WBC 4.0 - 10.5 K/uL 13.0(H) 8.8 9.0  RBC 4.22 - 5.81 MIL/uL 5.28 3.09(L) 2.51(L)  HGB 13.0 - 17.0 g/dL 29.513.5 6.2(Z8.2(L) 6.8(LL)  HCT 39.0 - 52.0 % 43.2 25.4(L) 21.2(L)  PLT 150 - 400 K/uL 333 374 378  NEUTROABS 1.7 - 7.7 K/uL 8.1(H) - -  LYMPHSABS 0.7 - 4.0 K/uL 3.6 - -     Body mass index is 39.56 kg/m.  Orders:  No orders of the defined types were placed in this encounter.  No orders of the defined types were placed in this encounter.    Procedures: No procedures performed  Clinical Data: No additional findings.  ROS:  All other systems negative, except as noted in the HPI. Review of Systems  Objective: Vital Signs: Ht 6'  4" (1.93 m)   Wt (!) 325 lb (147.4 kg)   BMI 39.56 kg/m   Specialty Comments:  No specialty comments available.  PMFS History: Patient Active Problem List   Diagnosis Date Noted  . Abscess of bursa of right knee   . Osteomyelitis of finger of right hand (HCC) 03/17/2019  . Non-pressure chronic ulcer of right calf, limited to breakdown of skin (HCC) 10/03/2016  . Status post bilateral below knee amputation (HCC) 05/15/2015  . Infection of below knee amputation stump (HCC) 05/15/2015  . Infection of amputation stump of right lower extremity (HCC) 05/15/2015  . Wound infection 05/15/2015  . Diabetic osteomyelitis (HCC) 08/05/2013  . Cellulitis and abscess of toe of left foot 08/01/2013  .  Hyponatremia 08/01/2013  . Hypokalemia 08/01/2013  . Sepsis associated hypotension (HCC) 08/01/2013  . Leukocytosis 08/01/2013  . Cellulitis and abscess of lower leg 12/17/2012  . DM foot ulcer (HCC) 12/17/2012  . DM2 (diabetes mellitus, type 2) (HCC) 12/17/2012  . Diabetic neuropathy (HCC) 12/17/2012   Past Medical History:  Diagnosis Date  . Arthritis    "ankles and feet" (08/19/2013)  . Bronchitis    hx of;last time about 2743yrs ago  . Chronic back pain   . Exertional shortness of breath   . ZOXWRUEA(540.9Headache(784.0)    "monthly" (08/19/2013)  . Heart murmur    Hx: of as a child  . Hyperlipidemia    takes Atorvastatin daily  . Neuromuscular disorder (HCC)    diabetic neuropathy in feet  . Type II diabetes mellitus (HCC)    takes Metformin bid and Lantus nightly  . Wears glasses     Family History  Problem Relation Age of Onset  . Other Mother   . Other Father     Past Surgical History:  Procedure Laterality Date  . AMPUTATION Right 01/02/2013   Procedure: AMPUTATION BELOW KNEE;  Surgeon: Nadara MustardMarcus V Duda, MD;  Location: [facility];  Service: Orthopedics;  Laterality: Right;  Right Below Knee Amputation  . AMPUTATION Left 04/25/2013   Procedure: AMPUTATION RAY;  Surgeon: Nadara MustardMarcus V Duda, MD;  [facility];  Service: Orthopedics;  Laterality: Left;  Left Foot 3rd Ray Amputation  . AMPUTATION Left 08/03/2013   Procedure: Left Midfoot Amputation;  Surgeon: Nadara MustardMarcus V Duda, MD;  [facility];  Service: Orthopedics;  Laterality: Left;  Left Foot Transmetatarsal Amputation  . AMPUTATION Left 08/19/2013   Procedure: AMPUTATION BELOW KNEE- left;  Surgeon: Nadara MustardMarcus V Duda, MD;  Location: [facility];  Service: Orthopedics;  Laterality: Left;  Left Below Knee Amputation  . AMPUTATION Right 05/16/2015   Procedure: AMPUTATION BELOW KNEE/REVISION;  Surgeon: Nadara MustardMarcus Duda V, MD;  Location: [facility] OR;  Service: Orthopedics;  Laterality: Right;  . AMPUTATION     middle finger right hand  . AMPUTATION Right 03/20/2019    Procedure: REVISION AMPUTATION RIGHT INDEX FINGER;  Surgeon: Nadara Mustarduda, Marcus V, MD;  [facility];  Service: Orthopedics;  Laterality: Right;  . BELOW KNEE LEG AMPUTATION Left 08/19/2013  . BELOW KNEE LEG AMPUTATION Right 12/2012  . ESOPHAGOGASTRODUODENOSCOPY    . I&D EXTREMITY Right 03/20/2019   Procedure: IRRIGATION AND DEBRIDEMENT RIGHT PREPATELLAR BURSA;  Surgeon: Nadara Mustarduda, Marcus V, MD;  [facility];  Service: Orthopedics;  Laterality: Right;  . IRRIGATION AND DEBRIDEMENT KNEE    . STUMP REVISION Right 05/16/2015  . TOE AMPUTATION Right 2012   at [facility], and removal of partial of second to 2012  . TONSILLECTOMY  Social History   Occupational History  . Not on file  Tobacco Use  . Smoking status: Former Smoker    Years: 2.00    Types: Cigarettes  . Smokeless tobacco: Never Used  . Tobacco comment: quit smoking cigarettes in the 80's  Substance and Sexual Activity  . Alcohol use: Not Currently    Comment:  "couple drinks maybe once/month"  . Drug use: Yes    Comment:  "recreational drugs in the 80's "  . Sexual activity: Not Currently

## 2019-06-11 ENCOUNTER — Ambulatory Visit: Payer: Medicare Other | Admitting: Orthopedic Surgery

## 2019-06-18 ENCOUNTER — Encounter (HOSPITAL_COMMUNITY): Payer: Self-pay | Admitting: *Deleted

## 2019-06-18 ENCOUNTER — Ambulatory Visit (INDEPENDENT_AMBULATORY_CARE_PROVIDER_SITE_OTHER): Payer: Medicare Other | Admitting: Orthopedic Surgery

## 2019-06-18 ENCOUNTER — Encounter: Payer: Self-pay | Admitting: Orthopedic Surgery

## 2019-06-18 ENCOUNTER — Other Ambulatory Visit: Payer: Self-pay

## 2019-06-18 ENCOUNTER — Other Ambulatory Visit (HOSPITAL_COMMUNITY)
Admission: RE | Admit: 2019-06-18 | Discharge: 2019-06-18 | Disposition: A | Discharge status: Home / Self Care | Payer: Medicare Other | Source: Ambulatory Visit | Attending: Orthopedic Surgery | Admitting: Orthopedic Surgery

## 2019-06-18 VITALS — Ht 76.0 in | Wt 325.0 lb

## 2019-06-18 DIAGNOSIS — M869 Osteomyelitis, unspecified: Secondary | ICD-10-CM

## 2019-06-18 DIAGNOSIS — I96 Gangrene, not elsewhere classified: Secondary | ICD-10-CM | POA: Insufficient documentation

## 2019-06-18 DIAGNOSIS — Z01812 Encounter for preprocedural laboratory examination: Secondary | ICD-10-CM | POA: Insufficient documentation

## 2019-06-18 DIAGNOSIS — Z20828 Contact with and (suspected) exposure to other viral communicable diseases: Secondary | ICD-10-CM | POA: Diagnosis not present

## 2019-06-18 DIAGNOSIS — M7041 Prepatellar bursitis, right knee: Secondary | ICD-10-CM

## 2019-06-18 LAB — SARS CORONAVIRUS 2 (TAT 6-24 HRS): SARS Coronavirus 2: NEGATIVE

## 2019-06-18 MED ORDER — DEXTROSE 5 % IV SOLN
3.0000 g | INTRAVENOUS | Status: AC
  Administered 2019-06-19: 12:00:00 3 g via INTRAVENOUS
  Filled 2019-06-18 (×2): qty 3000

## 2019-06-18 NOTE — Progress Notes (Signed)
Mr Gabriel Howe denies chest pain or shortness of breath. Mr Gabriel Howe denies any s/s of Covid, he had Covid test today and has been in quarantine since. Mr Gabriel Howe voiced frustration as we reviewed medications and health history. "I just went over my medications an hour ago and I was there at your hospital in May."  I explained to patient that we go over medications and history for his safety.  Mr Gabriel Howe has type 2 diabetes. He reports that CBG's have been running in the 200s  Since he has an infection. Patient did not bring his CBG machine with him. I instructed patient to not eat after midnight. I instructed patient that he may have clear liquids until 0720, we reviewed what clear liquids consist of. I instructed patient to drink G2 between 0700 and 0720.  Mr Gabriel Howe reports that he is driving himself her and driving himself home after surgery. "I live 2 hours away and I'm staying in a hotel tonight and I drove myself home before surgery.  I sent Dr Sharol Given an inbox note with this information.

## 2019-06-19 ENCOUNTER — Ambulatory Visit (HOSPITAL_COMMUNITY): Payer: Medicare Other | Admitting: Anesthesiology

## 2019-06-19 ENCOUNTER — Encounter (HOSPITAL_COMMUNITY)
Admission: RE | Disposition: A | Discharge status: Home / Self Care | Payer: Self-pay | Source: Home / Self Care | Attending: Orthopedic Surgery

## 2019-06-19 ENCOUNTER — Ambulatory Visit (HOSPITAL_COMMUNITY)
Admission: RE | Admit: 2019-06-19 | Discharge: 2019-06-19 | Disposition: A | Discharge status: Home / Self Care | Payer: Medicare Other | Attending: Orthopedic Surgery | Admitting: Orthopedic Surgery

## 2019-06-19 ENCOUNTER — Encounter (HOSPITAL_COMMUNITY): Payer: Self-pay | Admitting: *Deleted

## 2019-06-19 DIAGNOSIS — Z79899 Other long term (current) drug therapy: Secondary | ICD-10-CM | POA: Insufficient documentation

## 2019-06-19 DIAGNOSIS — Z87891 Personal history of nicotine dependence: Secondary | ICD-10-CM | POA: Diagnosis not present

## 2019-06-19 DIAGNOSIS — M869 Osteomyelitis, unspecified: Secondary | ICD-10-CM | POA: Insufficient documentation

## 2019-06-19 DIAGNOSIS — M86641 Other chronic osteomyelitis, right hand: Secondary | ICD-10-CM

## 2019-06-19 DIAGNOSIS — E785 Hyperlipidemia, unspecified: Secondary | ICD-10-CM | POA: Insufficient documentation

## 2019-06-19 DIAGNOSIS — Z89511 Acquired absence of right leg below knee: Secondary | ICD-10-CM | POA: Insufficient documentation

## 2019-06-19 DIAGNOSIS — E1169 Type 2 diabetes mellitus with other specified complication: Secondary | ICD-10-CM | POA: Diagnosis present

## 2019-06-19 DIAGNOSIS — Z794 Long term (current) use of insulin: Secondary | ICD-10-CM | POA: Insufficient documentation

## 2019-06-19 DIAGNOSIS — E1152 Type 2 diabetes mellitus with diabetic peripheral angiopathy with gangrene: Secondary | ICD-10-CM | POA: Diagnosis not present

## 2019-06-19 DIAGNOSIS — Z89021 Acquired absence of right finger(s): Secondary | ICD-10-CM | POA: Insufficient documentation

## 2019-06-19 DIAGNOSIS — E1165 Type 2 diabetes mellitus with hyperglycemia: Secondary | ICD-10-CM | POA: Insufficient documentation

## 2019-06-19 DIAGNOSIS — E1142 Type 2 diabetes mellitus with diabetic polyneuropathy: Secondary | ICD-10-CM | POA: Diagnosis not present

## 2019-06-19 DIAGNOSIS — I96 Gangrene, not elsewhere classified: Secondary | ICD-10-CM | POA: Insufficient documentation

## 2019-06-19 DIAGNOSIS — Z89512 Acquired absence of left leg below knee: Secondary | ICD-10-CM | POA: Insufficient documentation

## 2019-06-19 HISTORY — PX: AMPUTATION: SHX166

## 2019-06-19 LAB — BASIC METABOLIC PANEL
Anion gap: 10 (ref 5–15)
BUN: 15 mg/dL (ref 6–20)
CO2: 20 mmol/L — ABNORMAL LOW (ref 22–32)
Calcium: 8.8 mg/dL — ABNORMAL LOW (ref 8.9–10.3)
Chloride: 105 mmol/L (ref 98–111)
Creatinine, Ser: 1.1 mg/dL (ref 0.61–1.24)
GFR calc Af Amer: >60 mL/min (ref >60)
GFR calc non Af Amer: >60 mL/min (ref >60)
Glucose, Bld: 283 mg/dL — ABNORMAL HIGH (ref 70–99)
Potassium: 4 mmol/L (ref 3.5–5.1)
Sodium: 135 mmol/L (ref 135–145)

## 2019-06-19 LAB — CBC
HCT: 41 % (ref 39.0–52.0)
Hemoglobin: 13 g/dL (ref 13.0–17.0)
MCH: 25.9 pg — ABNORMAL LOW (ref 26.0–34.0)
MCHC: 31.7 g/dL (ref 30.0–36.0)
MCV: 81.7 fL (ref 80.0–100.0)
Platelets: 303 10*3/uL (ref 150–400)
RBC: 5.02 MIL/uL (ref 4.22–5.81)
RDW: 14.5 % (ref 11.5–15.5)
WBC: 10.6 10*3/uL — ABNORMAL HIGH (ref 4.0–10.5)
nRBC: 0 % (ref 0.0–0.2)

## 2019-06-19 LAB — GLUCOSE, CAPILLARY
Glucose-Capillary: 259 mg/dL — ABNORMAL HIGH (ref 70–99)
Glucose-Capillary: 276 mg/dL — ABNORMAL HIGH (ref 70–99)
Glucose-Capillary: 218 mg/dL — ABNORMAL HIGH (ref 70–99)
Glucose-Capillary: 249 mg/dL — ABNORMAL HIGH (ref 70–99)

## 2019-06-19 SURGERY — AMPUTATION DIGIT
Anesthesia: General | Site: Hand | Laterality: Left

## 2019-06-19 MED ORDER — MIDAZOLAM HCL 2 MG/2ML IJ SOLN
INTRAMUSCULAR | Status: AC
  Filled 2019-06-19: qty 2

## 2019-06-19 MED ORDER — FENTANYL CITRATE (PF) 250 MCG/5ML IJ SOLN
INTRAMUSCULAR | Status: AC
  Filled 2019-06-19: qty 5

## 2019-06-19 MED ORDER — 0.9 % SODIUM CHLORIDE (POUR BTL) OPTIME
TOPICAL | Status: DC | PRN
  Administered 2019-06-19: 1000 mL

## 2019-06-19 MED ORDER — ONDANSETRON HCL 4 MG/2ML IJ SOLN
INTRAMUSCULAR | Status: DC | PRN
  Administered 2019-06-19: 4 mg via INTRAVENOUS

## 2019-06-19 MED ORDER — PROPOFOL 500 MG/50ML IV EMUL
INTRAVENOUS | Status: DC | PRN
  Administered 2019-06-19: 50 ug/kg/min via INTRAVENOUS

## 2019-06-19 MED ORDER — CHLORHEXIDINE GLUCONATE 4 % EX LIQD: 60.0000 mL | Freq: Once | CUTANEOUS | Status: DC

## 2019-06-19 MED ORDER — HYDROCODONE-ACETAMINOPHEN 5-325 MG PO TABS: 1.0000 | ORAL_TABLET | Freq: Four times a day (QID) | ORAL | 0 refills | Status: DC | PRN

## 2019-06-19 MED ORDER — LACTATED RINGERS IV SOLN
INTRAVENOUS | Status: DC
  Administered 2019-06-19: 09:00:00 via INTRAVENOUS

## 2019-06-19 MED ORDER — PROPOFOL 10 MG/ML IV BOLUS
INTRAVENOUS | Status: DC | PRN
  Administered 2019-06-19: 40 mg via INTRAVENOUS

## 2019-06-19 MED ORDER — FENTANYL CITRATE (PF) 100 MCG/2ML IJ SOLN
INTRAMUSCULAR | Status: DC | PRN
  Administered 2019-06-19: 50 ug via INTRAVENOUS

## 2019-06-19 MED ORDER — BUPIVACAINE HCL 0.25 % IJ SOLN
INTRAMUSCULAR | Status: DC | PRN
  Administered 2019-06-19: 20 mL

## 2019-06-19 MED ORDER — BUPIVACAINE HCL (PF) 0.25 % IJ SOLN
INTRAMUSCULAR | Status: AC
  Filled 2019-06-19: qty 30

## 2019-06-19 SURGICAL SUPPLY — 33 items
BLADE SURG 21 STRL SS (BLADE) ×2 IMPLANT
BNDG COHESIVE 2X5 TAN STRL LF (GAUZE/BANDAGES/DRESSINGS) ×2 IMPLANT
BNDG COHESIVE 4X5 TAN STRL (GAUZE/BANDAGES/DRESSINGS) IMPLANT
BNDG CONFORM 3 STRL LF (GAUZE/BANDAGES/DRESSINGS) ×2 IMPLANT
BNDG ESMARK 4X9 LF (GAUZE/BANDAGES/DRESSINGS) IMPLANT
BNDG GAUZE ELAST 4 BULKY (GAUZE/BANDAGES/DRESSINGS) IMPLANT
COVER SURGICAL LIGHT HANDLE (MISCELLANEOUS) ×4 IMPLANT
COVER WAND RF STERILE (DRAPES) ×2 IMPLANT
DRAPE U-SHAPE 47X51 STRL (DRAPES) ×2 IMPLANT
DRSG ADAPTIC 3X8 NADH LF (GAUZE/BANDAGES/DRESSINGS) IMPLANT
DRSG EMULSION OIL 3X3 NADH (GAUZE/BANDAGES/DRESSINGS) ×2 IMPLANT
DRSG PAD ABDOMINAL 8X10 ST (GAUZE/BANDAGES/DRESSINGS) ×2 IMPLANT
DURAPREP 26ML APPLICATOR (WOUND CARE) ×2 IMPLANT
ELECT REM PT RETURN 9FT ADLT (ELECTROSURGICAL) ×2
ELECTRODE REM PT RTRN 9FT ADLT (ELECTROSURGICAL) ×1 IMPLANT
GAUZE SPONGE 4X4 12PLY STRL (GAUZE/BANDAGES/DRESSINGS) IMPLANT
GAUZE SPONGE 4X4 12PLY STRL LF (GAUZE/BANDAGES/DRESSINGS) ×2 IMPLANT
GLOVE BIOGEL PI IND STRL 9 (GLOVE) ×1 IMPLANT
GLOVE BIOGEL PI INDICATOR 9 (GLOVE) ×1
GLOVE SURG ORTHO 9.0 STRL STRW (GLOVE) ×2 IMPLANT
GOWN STRL REUS W/ TWL XL LVL3 (GOWN DISPOSABLE) ×2 IMPLANT
GOWN STRL REUS W/TWL XL LVL3 (GOWN DISPOSABLE) ×2
KIT BASIN OR (CUSTOM PROCEDURE TRAY) ×2 IMPLANT
KIT TURNOVER KIT B (KITS) ×2 IMPLANT
MANIFOLD NEPTUNE II (INSTRUMENTS) ×2 IMPLANT
NEEDLE 22X1 1/2 (OR ONLY) (NEEDLE) IMPLANT
NEEDLE HYPO 25GX1X1/2 BEV (NEEDLE) ×2 IMPLANT
NS IRRIG 1000ML POUR BTL (IV SOLUTION) ×2 IMPLANT
PACK ORTHO EXTREMITY (CUSTOM PROCEDURE TRAY) ×2 IMPLANT
PAD ARMBOARD 7.5X6 YLW CONV (MISCELLANEOUS) ×4 IMPLANT
SUT ETHILON 2 0 PSLX (SUTURE) ×2 IMPLANT
SYR CONTROL 10ML LL (SYRINGE) ×2 IMPLANT
TOWEL GREEN STERILE (TOWEL DISPOSABLE) ×2 IMPLANT

## 2019-06-19 NOTE — Op Note (Signed)
06/19/2019  12:37 PM  PATIENT:  Gabriel Howe    PRE-OPERATIVE DIAGNOSIS:  Osteomyelitis, Gangrene Left Index Finger  POST-OPERATIVE DIAGNOSIS:  Same  PROCEDURE:  LEFT INDEX FINGER PARTIAL AMPUTATION  SURGEON:  Newt Minion, MD  PHYSICIAN ASSISTANT:None ANESTHESIA:   General  PREOPERATIVE INDICATIONS:  [name] is a  58 y.o. male with a diagnosis of Osteomyelitis, Gangrene Left Index Finger who failed conservative measures and elected for surgical management.    The risks benefits and alternatives were discussed with the patient preoperatively including but not limited to the risks of infection, bleeding, nerve injury, cardiopulmonary complications, the need for revision surgery, among others, and the patient was willing to proceed.  OPERATIVE IMPLANTS: none  _0 @  OPERATIVE FINDINGS: No abscess at the level of amputation.  OPERATIVE PROCEDURE: Patient was brought the operating room and underwent a MAC anesthetic.  The left upper extremity was then prepped using DuraPrep draped into a sterile field a timeout was called.  Patient underwent a digital block with 20 cc of quarter percent Marcaine plain.  After adequate levels anesthesia were obtained a fishmouth incision was made through the middle phalanx just proximal to the infected skin edges.  The middle phalanx was amputated with a rondure and a bone cutting tool.  Neurectomies were performed the wound was irrigated with normal saline the skin was advanced to cover the bone.  Sterile dressing was applied patient was taken the PACU in stable condition.   DISCHARGE PLANNING:  Antibiotic duration: Preoperative antibiotics only  Weightbearing: Not applicable  Pain medication: Patient requests no pain medicine  Dressing care/ Wound VAC: Follow-up in 2 weeks to change the dressing  Ambulatory devices: Not applicable  Discharge to: Home.  Follow-up: In the office 1 week post operative.

## 2019-06-19 NOTE — H&P (Signed)
Gabriel Howe is an 58 y.o. male.   Chief Complaint: Left index finger infection HPI: The patient is a 58 year old gentleman who has a history of insensate polyneuropathy associated with type 2 diabetes, hyperlipidemia, and previous bilateral lower extremity amputations as well as right finger amputations who presented with new onset of infection of his left index finger.  He presents today for partial left index finger amputation.  Past Medical History:  Diagnosis Date  . Arthritis    "ankles and feet" (08/19/2013)  . Bronchitis    hx of;last time about 68yrs ago  . Chronic back pain   . Exertional shortness of breath   . XFGHWEXH(371.6)    "monthly" (08/19/2013)  . Heart murmur    Hx: of as a child  . Hyperlipidemia    takes Atorvastatin daily  . Neuromuscular disorder ([place])    diabetic neuropathy in feet  . Type II diabetes mellitus ([place])    takes Metformin bid and Lantus nightly  . Wears glasses     Past Surgical History:  Procedure Laterality Date  . AMPUTATION Right 01/02/2013   Procedure: AMPUTATION BELOW KNEE;  Surgeon: Newt Minion, MD;  Location: [place];  Service: Orthopedics;  Laterality: Right;  Right Below Knee Amputation  . AMPUTATION Left 04/25/2013   Procedure: AMPUTATION RAY;  Surgeon: Newt Minion, MD;  Location: [place];  Service: Orthopedics;  Laterality: Left;  Left Foot 3rd Ray Amputation  . AMPUTATION Left 08/03/2013   Procedure: Left Midfoot Amputation;  Surgeon: Newt Minion, MD;  Location: [place];  Service: Orthopedics;  Laterality: Left;  Left Foot Transmetatarsal Amputation  . AMPUTATION Left 08/19/2013   Procedure: AMPUTATION BELOW KNEE- left;  Surgeon: Newt Minion, MD;  Location: [place];  Service: Orthopedics;  Laterality: Left;  Left Below Knee Amputation  . AMPUTATION Right 05/16/2015   Procedure: AMPUTATION BELOW KNEE/REVISION;  Surgeon: Newt Minion, MD;  Location: [place];  Service: Orthopedics;  Laterality: Right;  . AMPUTATION     middle finger  right hand  . AMPUTATION Right 03/20/2019   Procedure: REVISION AMPUTATION RIGHT INDEX FINGER;  Surgeon: Newt Minion, MD;  Location: [place];  Service: Orthopedics;  Laterality: Right;  . BELOW KNEE LEG AMPUTATION Left 08/19/2013  . BELOW KNEE LEG AMPUTATION Right 12/2012  . ESOPHAGOGASTRODUODENOSCOPY    . I&D EXTREMITY Right 03/20/2019   Procedure: IRRIGATION AND DEBRIDEMENT RIGHT PREPATELLAR BURSA;  Surgeon: Newt Minion, MD;  Location: [place];  Service: Orthopedics;  Laterality: Right;  . IRRIGATION AND DEBRIDEMENT KNEE    . STUMP REVISION Right 05/16/2015  . TOE AMPUTATION Right 2012   at [facility], and removal of partial of second to 2012  . TONSILLECTOMY      Family History  Problem Relation Age of Onset  . Other Mother   . Other Father    Social History:  reports that he has quit smoking. His smoking use included cigarettes. He quit after 2.00 years of use. He has never used smokeless tobacco. He reports previous alcohol use. He reports previous drug use.  Allergies:  Allergies  Allergen Reactions  . Bactroban [Mupirocin Calcium] Hives and Swelling  . Sulfamethoxazole-Trimethoprim Hives  . Shellfish Allergy Rash    No medications prior to admission.    Results for orders placed or performed during the hospital encounter of 06/18/19 (from the past 48 hour(s))  SARS CORONAVIRUS 2 Nasal Swab Aptima Multi Swab     Status: None   Collection Time:  06/18/19  2:32 PM   Specimen: Aptima Multi Swab; Nasal Swab  Result Value Ref Range   SARS Coronavirus 2 NEGATIVE NEGATIVE    Comment: (NOTE) SARS-CoV-2 target nucleic acids are NOT DETECTED. The SARS-CoV-2 RNA is generally detectable in upper and lower respiratory specimens during the acute phase of infection. Negative results do not preclude SARS-CoV-2 infection, do not rule out co-infections with other pathogens, and should not be used as the sole basis for treatment or other patient management decisions. Negative results must be  combined with clinical observations, patient history, and epidemiological information. The expected result is Negative. Fact Sheet for Patients: HairSlick.nohttps://www.fda.gov/media/138098/download Fact Sheet for Healthcare Providers: quierodirigir.comhttps://www.fda.gov/media/138095/download This test is not yet approved or cleared by the [place] FDA and  has been authorized for detection and/or diagnosis of SARS-CoV-2 by FDA under an Emergency Use Authorization (EUA). This EUA will remain  in effect (meaning this test can be used) for the duration of the COVID-19 declaration under Section 56 4(b)(1) of the Act, 21 U.S.C. section 360bbb-3(b)(1), unless the authorization is terminated or revoked sooner. Performed at [facility], [address] [place], [place] [address]    No results found.  Review of Systems  All other systems reviewed and are negative.   There were no vitals taken for this visit. Physical Exam  Constitutional: He is oriented to person, place, and time. He appears well-developed and well-nourished. No distress.  HENT:  Head: Normocephalic and atraumatic.  Neck: No tracheal deviation present. No thyromegaly present.  Cardiovascular: Normal rate.  Respiratory: Effort normal. No stridor.  GI: Soft.  Musculoskeletal:     Comments: Left index finger with purulence/infection.  Previous right index finger amputation at DIP joint.  Previous right transtibial amputation and left transmetatarsal amputations.   Neurological: He is alert and oriented to person, place, and time. No cranial nerve deficit.  Skin: Skin is warm.  Psychiatric: He has a normal mood and affect. His behavior is normal. Judgment and thought content normal.     Assessment/Plan Left index finger infection/gangrene- plan partial left index finger amputation-the procedure and possible benefits and risk including the risk of bleeding, infection, neurovascular injury, and possible need for further surgery were  discussed with the patient and his questions were answered.  The patient wishes to proceed with surgery at this time.  Lazaro ArmsSHAWN Jumaane Weatherford, PA-C 06/19/2019, 7:22 AM  [facility] [phone]

## 2019-06-19 NOTE — Transfer of Care (Signed)
Immediate Anesthesia Transfer of Care Note  Patient: Gabriel Howe  Procedure(s) Performed: LEFT INDEX FINGER PARTIAL AMPUTATION (Left Hand)  Patient Location: PACU  Anesthesia Type:General  Level of Consciousness: awake, alert  and patient cooperative  Airway & Oxygen Therapy: Patient Spontanous Breathing  Post-op Assessment: Report given to RN, Post -op Vital signs reviewed and stable and Patient moving all extremities X 4  Post vital signs: Reviewed and stable  Last Vitals:  Vitals Value Taken Time  BP 113/90 06/19/19 1225  Temp    Pulse 89 06/19/19 1226  Resp 19 06/19/19 1226  SpO2 97 % 06/19/19 1226  Vitals shown include unvalidated device data.  Last Pain:  Vitals:   06/19/19 0835  PainSc: 0-No pain      Patients Stated Pain Goal: 0 (40/08/67 6195)  Complications: No apparent anesthesia complications

## 2019-06-19 NOTE — Progress Notes (Addendum)
Told by staff that patient had stated he was going to drive himself to the hotel post-op. Anesthesia made aware and deferred to surgeon. Will make surgeon aware when he arrives to unit.

## 2019-06-19 NOTE — Anesthesia Postprocedure Evaluation (Signed)
Anesthesia Post Note  Patient: Gabriel Howe  Procedure(s) Performed: LEFT INDEX FINGER PARTIAL AMPUTATION (Left Hand)     Patient location during evaluation: PACU Anesthesia Type: General Level of consciousness: awake and alert Pain management: pain level controlled Vital Signs Assessment: post-procedure vital signs reviewed and stable Respiratory status: spontaneous breathing, nonlabored ventilation, respiratory function stable and patient connected to nasal cannula oxygen Cardiovascular status: blood pressure returned to baseline and stable Postop Assessment: no apparent nausea or vomiting Anesthetic complications: no    Last Vitals:  Vitals:   06/19/19 1225 06/19/19 1245  BP: 113/90 (!) 152/98  Pulse: 85 90  Resp: 16 20  Temp: (!) 36.1 C (!) 36.2 C  SpO2: 97% 97%    Last Pain:  Vitals:   06/19/19 1245  PainSc: 0-No pain                 Kassidi Elza

## 2019-06-19 NOTE — Progress Notes (Signed)
Surgeon made aware of patient stating he wanted to drive himself to the hotel upon discharge. MD stated patient would be "ok to drive" because he was not receiving general anesthesia for his procedure today.

## 2019-06-19 NOTE — Progress Notes (Signed)
Pt driving himself home after surgery. This RN confirmed that this is okay with Dr. Rayetta Pigg MDA.

## 2019-06-19 NOTE — Anesthesia Preprocedure Evaluation (Signed)
Anesthesia Evaluation  Patient identified by MRN, date of birth, ID band Patient awake    Reviewed: Allergy & Precautions, NPO status , Patient's Chart, lab work & pertinent test results, reviewed documented beta blocker date and time   History of Anesthesia Complications Negative for: history of anesthetic complications  Airway Mallampati: III  TM Distance: >3 FB     Dental  (+) Dental Advisory Given, Teeth Intact   Pulmonary shortness of breath and with exertion, former smoker,    breath sounds clear to auscultation       Cardiovascular (-) hypertension(-) Past MI and (-) CHF + Valvular Problems/Murmurs  Rhythm:Regular     Neuro/Psych  Headaches,  Neuromuscular disease negative psych ROS   GI/Hepatic negative GI ROS, Neg liver ROS,   Endo/Other  diabetes, Poorly Controlled, Type 2  Renal/GU negative Renal ROS     Musculoskeletal  (+) Arthritis ,   Abdominal   Peds  Hematology negative hematology ROS (+)   Anesthesia Other Findings   Reproductive/Obstetrics negative OB ROS                             Anesthesia Physical  Anesthesia Plan  ASA: III  Anesthesia Plan: General   Post-op Pain Management:    Induction: Intravenous  PONV Risk Score and Plan: 2 and Ondansetron and Treatment may vary due to age or medical condition  Airway Management Planned: LMA and Oral ETT  Additional Equipment: None  Intra-op Plan:   Post-operative Plan: Extubation in OR  Informed Consent: I have reviewed the patients History and Physical, chart, labs and discussed the procedure including the risks, benefits and alternatives for the proposed anesthesia with the patient or authorized representative who has indicated his/her understanding and acceptance.     Dental advisory given  Plan Discussed with: CRNA, Surgeon and Anesthesiologist  Anesthesia Plan Comments:         Anesthesia Quick  Evaluation

## 2019-06-21 ENCOUNTER — Encounter: Payer: Self-pay | Admitting: Orthopedic Surgery

## 2019-06-21 NOTE — Progress Notes (Signed)
Office Visit Note   Patient: Gabriel Howe           Date of Birth: [birth year]           MRN: [id number] Visit Date: 06/18/2019              Requested by: No referring provider defined for this encounter. PCP: System, Pcp Not In  Chief Complaint  Patient presents with  . Left Hand - Pain      HPI: Patient is a 58 year old gentleman who presents for a new necrotic ulcer with osteomyelitis of the left index finger.  Patient states he was started on doxycycline and then oral vancomycin.  Patient complains of drainage and necrotic tissue.  Patient states that the right knee open wound over the prepatellar bursa is improving.  Assessment & Plan: Visit Diagnoses:  1. Prepatellar bursitis of right knee   2. Osteomyelitis of finger of left hand (HCC)     Plan: With the necrotic abscess of the left index finger with exposed bone and osteomyelitis will plan for an amputation of the left index finger through the middle phalanx tomorrow as an outpatient.  Risks and benefits were discussed including risk of the wound not healing need for more proximal amputation.  Patient states he understands wished to proceed at this time.  He will continue with dressing changes for the right knee.  Follow-Up Instructions: Return in about 2 weeks (around 07/02/2019).   Ortho Exam  Patient is alert, oriented, no adenopathy, well-dressed, normal affect, normal respiratory effort. Examination patient has cellulitis to the middle of the left index finger the tip of the finger is ischemic necrotic with exposed bone and necrotic bone.  There is no cellulitis into the hand.  The right knee wound has 100% beefy granulation tissue and this is 2 cm in diameter 5 mm deep.  Imaging: No results found. No images are attached to the encounter.  Labs: Lab Results  Component Value Date   HGBA1C 7.2 (H) 05/15/2015   HGBA1C 9.0 (H) 08/01/2013   HGBA1C 12.5 (H) 12/17/2012   REPTSTATUS 03/25/2019 FINAL 03/20/2019   GRAMSTAIN  03/20/2019    ABUNDANT WBC PRESENT,BOTH PMN AND MONONUCLEAR NO ORGANISMS SEEN    CULT  03/20/2019    RARE GROUP B STREP(S.AGALACTIAE)ISOLATED TESTING AGAINST S. AGALACTIAE NOT ROUTINELY PERFORMED DUE TO PREDICTABILITY OF AMP/PEN/VAN SUSCEPTIBILITY. RARE STREPTOCOCCUS CONSTELLATUS RESULT CALLED TO, READ BACK BY AND VERIFIED WITH: Enrigue CatenaJ MOORE [phone] MLM NO ANAEROBES ISOLATED Performed at [facility], [address] [place], [place] [address]      Lab Results  Component Value Date   ALBUMIN 3.2 (L) 03/20/2019   ALBUMIN 1.9 (L) 05/16/2015   ALBUMIN 2.6 (L) 08/19/2013    Lab Results  Component Value Date   MG 2.0 08/03/2013   No results found for: VD25OH  No results found for: PREALBUMIN CBC EXTENDED Latest Ref Rng & Units 06/19/2019 03/20/2019 05/20/2015  WBC 4.0 - 10.5 K/uL 10.6(H) 13.0(H) 8.8  RBC 4.22 - 5.81 MIL/uL 5.02 5.28 3.09(L)  HGB 13.0 - 17.0 g/dL 29.513.0 62.113.5 3.0(Q8.2(L)  HCT 65.739.0 - 52.0 % 41.0 43.2 25.4(L)  PLT 150 - 400 K/uL 303 333 374  NEUTROABS 1.7 - 7.7 K/uL - 8.1(H) -  LYMPHSABS 0.7 - 4.0 K/uL - 3.6 -     Body mass index is 39.56 kg/m.  Orders:  No orders of the defined types were placed in this encounter.  No orders of the defined types were  placed in this encounter.    Procedures: No procedures performed  Clinical Data: No additional findings.  ROS:  All other systems negative, except as noted in the HPI. Review of Systems  Objective: Vital Signs: Ht 6\' 4"  (1.93 m)   Wt (!) 325 lb (147.4 kg)   BMI 39.56 kg/m   Specialty Comments:  No specialty comments available.  PMFS History: Patient Active Problem List   Diagnosis Date Noted  . Chronic osteomyelitis of hand including fingers, right ([place])   . Abscess of bursa of right knee   . Osteomyelitis of finger of right hand ([place]) 03/17/2019  . Non-pressure chronic ulcer of right calf, limited to breakdown of skin ([place]) 10/03/2016  . Status post bilateral below knee  amputation ([place]) 05/15/2015  . Infection of below knee amputation stump ([place]) 05/15/2015  . Infection of amputation stump of right lower extremity ([place]) 05/15/2015  . Wound infection 05/15/2015  . Diabetic osteomyelitis ([place]) 08/05/2013  . Cellulitis and abscess of toe of left foot 08/01/2013  . Hyponatremia 08/01/2013  . Hypokalemia 08/01/2013  . Sepsis associated hypotension ([place]) 08/01/2013  . Leukocytosis 08/01/2013  . Cellulitis and abscess of lower leg 12/17/2012  . DM foot ulcer ([place]) 12/17/2012  . DM2 (diabetes mellitus, type 2) ([place]) 12/17/2012  . Diabetic neuropathy ([place]) 12/17/2012   Past Medical History:  Diagnosis Date  . Arthritis    "ankles and feet" (08/19/2013)  . Bronchitis    hx of;last time about 71yrs ago  . Chronic back pain   . Exertional shortness of breath   . MPNTIRWE(315.4)    "monthly" (08/19/2013)  . Heart murmur    Hx: of as a child  . Hyperlipidemia    takes Atorvastatin daily  . Neuromuscular disorder ([place])    diabetic neuropathy in feet  . Type II diabetes mellitus ([place])    takes Metformin bid and Lantus nightly  . Wears glasses     Family History  Problem Relation Age of Onset  . Other Mother   . Other Father     Past Surgical History:  Procedure Laterality Date  . AMPUTATION Right 01/02/2013   Procedure: AMPUTATION BELOW KNEE;  Surgeon: Newt Minion, MD;  Location[facility];  Service: Orthopedics;  Laterality: Right;  Right Below Knee Amputation  . AMPUTATION Left 04/25/2013   Procedure: AMPUTATION RAY;  Surgeon: Newt Minion, MD;  Location: [facility];  Service: Orthopedics;  Laterality: Left;  Left Foot 3rd Ray Amputation  . AMPUTATION Left 08/03/2013   Procedure: Left Midfoot Amputation;  Surgeon: Newt Minion, MD;  Location: [facility];  Service: Orthopedics;  Laterality: Left;  Left Foot Transmetatarsal Amputation  . AMPUTATION Left 08/19/2013   Procedure: AMPUTATION BELOW KNEE- left;  Surgeon: Newt Minion, MD;  Location: [facility];  Service:  Orthopedics;  Laterality: Left;  Left Below Knee Amputation  . AMPUTATION Right 05/16/2015   Procedure: AMPUTATION BELOW KNEE/REVISION;  Surgeon: Newt Minion, MD;  Location: [facility];  Service: Orthopedics;  Laterality: Right;  . AMPUTATION     middle finger right hand  . AMPUTATION Right 03/20/2019   Procedure: REVISION AMPUTATION RIGHT INDEX FINGER;  Surgeon: Newt Minion, MD;  Location: [facility];  Service: Orthopedics;  Laterality: Right;  . BELOW KNEE LEG AMPUTATION Left 08/19/2013  . BELOW KNEE LEG AMPUTATION Right 12/2012  . ESOPHAGOGASTRODUODENOSCOPY    . I&D EXTREMITY Right 03/20/2019   Procedure: IRRIGATION AND DEBRIDEMENT RIGHT PREPATELLAR BURSA;  Surgeon: Newt Minion,  MD;  [facility];  Service: Orthopedics;  Laterality: Right;  . IRRIGATION AND DEBRIDEMENT KNEE    . STUMP REVISION Right 05/16/2015  . TOE AMPUTATION Right 2012   at [facility], and removal of partial of second to 2012  . TONSILLECTOMY     Social History   Occupational History  . Not on file  Tobacco Use  . Smoking status: Former Smoker    Years: 2.00    Types: Cigarettes  . Smokeless tobacco: Never Used  . Tobacco comment: quit smoking cigarettes in the 80's  Substance and Sexual Activity  . Alcohol use: Not Currently    Comment:  "couple drinks maybe once/month"  . Drug use: Not Currently    Comment:  "recreational drugs in the 80's "  . Sexual activity: Not Currently

## 2019-06-22 ENCOUNTER — Telehealth: Payer: Self-pay

## 2019-06-22 ENCOUNTER — Telehealth: Payer: Self-pay | Admitting: Orthopedic Surgery

## 2019-06-22 NOTE — Telephone Encounter (Signed)
Patient was notified that his Rx for liners, demographics and last office note will be faxed to [phone] [facility] in [place], [place].

## 2019-06-22 NOTE — Telephone Encounter (Signed)
Patient was notified and Rx will be faxed to [facility] in [place] at [phone].

## 2019-06-22 NOTE — Telephone Encounter (Signed)
Patient called stating that [facility] needs a RX for the liners for his prosthetic legs.  He needs it sent to the one in [place].  CB#[phone].  Thank you.

## 2019-06-23 ENCOUNTER — Encounter (HOSPITAL_COMMUNITY): Payer: Self-pay | Admitting: Orthopedic Surgery

## 2019-06-26 ENCOUNTER — Other Ambulatory Visit: Payer: Self-pay | Admitting: Orthopedic Surgery

## 2019-06-26 ENCOUNTER — Telehealth: Payer: Self-pay | Admitting: Orthopedic Surgery

## 2019-06-26 MED ORDER — DOXYCYCLINE HYCLATE 100 MG PO TABS: 100.0000 mg | ORAL_TABLET | Freq: Two times a day (BID) | ORAL | 0 refills | Status: AC

## 2019-06-26 NOTE — Telephone Encounter (Signed)
I called patient and advised. Appt scheduled for Monday 06/29/2019.

## 2019-06-26 NOTE — Telephone Encounter (Signed)
Patient called advised he is not feeling well. Patient asked if Dr Sharol Given can call in Doxycycline for him. Patient said he has an infection. The number to contact patient is [phone]

## 2019-06-26 NOTE — Telephone Encounter (Signed)
I called patient. He states that he knows that he has infection, that he has had it many times before requiring multiple surgeries. Most recently had partial finger amputation and he states that he has redness and swelling from the knuckle to the top of the hand, it is sore to touch, and he had fever with sweats and chills last night. He did not take his temperature.  He requests antibiotic.  He had requested appt for Monday but was told there was nothing available. I will be glad to work patient in for appt on Monday.  Please advise on antibiotic.

## 2019-06-26 NOTE — Telephone Encounter (Signed)
rx sent for doxy

## 2019-06-29 ENCOUNTER — Encounter: Payer: Self-pay | Admitting: Orthopedic Surgery

## 2019-06-29 ENCOUNTER — Ambulatory Visit (INDEPENDENT_AMBULATORY_CARE_PROVIDER_SITE_OTHER): Payer: Medicare Other | Admitting: Orthopedic Surgery

## 2019-06-29 ENCOUNTER — Other Ambulatory Visit: Payer: Self-pay

## 2019-06-29 VITALS — Ht 76.0 in | Wt 324.0 lb

## 2019-06-29 DIAGNOSIS — M869 Osteomyelitis, unspecified: Secondary | ICD-10-CM

## 2019-06-30 ENCOUNTER — Inpatient Hospital Stay: Payer: Medicare Other | Admitting: Orthopedic Surgery

## 2019-07-02 ENCOUNTER — Encounter (HOSPITAL_COMMUNITY): Payer: Self-pay | Admitting: *Deleted

## 2019-07-02 ENCOUNTER — Ambulatory Visit (INDEPENDENT_AMBULATORY_CARE_PROVIDER_SITE_OTHER): Payer: Medicare Other | Admitting: Orthopedic Surgery

## 2019-07-02 ENCOUNTER — Encounter: Payer: Self-pay | Admitting: Orthopedic Surgery

## 2019-07-02 ENCOUNTER — Other Ambulatory Visit: Payer: Self-pay

## 2019-07-02 VITALS — Ht 76.0 in | Wt 324.0 lb

## 2019-07-02 DIAGNOSIS — M869 Osteomyelitis, unspecified: Secondary | ICD-10-CM

## 2019-07-02 DIAGNOSIS — T8781 Dehiscence of amputation stump: Secondary | ICD-10-CM

## 2019-07-02 NOTE — Progress Notes (Signed)
Office Visit Note   Patient: Gabriel Howe           Date of Birth: 11-08-1960           MRN: [id number] Visit Date: 07/02/2019              Requested by: No referring provider defined for this encounter. PCP: System, Pcp Not In  Chief Complaint  Patient presents with  . Left Hand - Routine Post Op    06/19/2019 left index finger partial amp      HPI: Patient is a 58 year old gentleman who presents in follow-up status post amputation of the index finger.  Patient presented earlier with early wound dehiscence he was started on antibiotics and presents at this time.  Assessment & Plan: Visit Diagnoses:  1. Osteomyelitis of finger of left hand ([place])   2. Dehiscence of amputation stump ([place])     Plan: Patient has had progressive wound dehiscence exposed bone of the index finger of the middle phalanx.  Will need to plan for revision surgery most likely proximal to the PIP joint.  Will proceed with surgery as an outpatient.  Risks and benefits were discussed including risk of the wound not healing process of infection patient states he understands wished to proceed at this time.  Follow-Up Instructions: Return in about 2 weeks (around 07/16/2019).   Ortho Exam  Patient is alert, oriented, no adenopathy, well-dressed, normal affect, normal respiratory effort. Examination patient has dehiscence of the amputated stump left index finger.  There is exposed bone there is necrotic tissue.  There is cellulitis up to the MCP joint.  Imaging: No results found. No images are attached to the encounter.  Labs: Lab Results  Component Value Date   HGBA1C 7.2 (H) 05/15/2015   HGBA1C 9.0 (H) 08/01/2013   HGBA1C 12.5 (H) 12/17/2012   REPTSTATUS 03/25/2019 FINAL 03/20/2019   GRAMSTAIN  03/20/2019    ABUNDANT WBC PRESENT,BOTH PMN AND MONONUCLEAR NO ORGANISMS SEEN    CULT  03/20/2019    RARE GROUP B STREP(S.AGALACTIAE)ISOLATED TESTING AGAINST S. AGALACTIAE NOT ROUTINELY PERFORMED DUE TO  PREDICTABILITY OF AMP/PEN/VAN SUSCEPTIBILITY. RARE STREPTOCOCCUS CONSTELLATUS RESULT CALLED TO, READ BACK BY AND VERIFIED WITH: Othelia Pulling [id number] MLM NO ANAEROBES ISOLATED Performed at [facility], [place] [address] [place], [place] [address]      Lab Results  Component Value Date   ALBUMIN 3.2 (L) 03/20/2019   ALBUMIN 1.9 (L) 05/16/2015   ALBUMIN 2.6 (L) 08/19/2013    Lab Results  Component Value Date   MG 2.0 08/03/2013   No results found for: VD25OH  No results found for: PREALBUMIN CBC EXTENDED Latest Ref Rng & Units 06/19/2019 03/20/2019 05/20/2015  WBC 4.0 - 10.5 K/uL 10.6(H) 13.0(H) 8.8  RBC 4.22 - 5.81 MIL/uL 5.02 5.28 3.09(L)  HGB 13.0 - 17.0 g/dL 13.0 13.5 8.2(L)  HCT 39.0 - 52.0 % 41.0 43.2 25.4(L)  PLT 150 - 400 K/uL 303 333 374  NEUTROABS 1.7 - 7.7 K/uL - 8.1(H) -  LYMPHSABS 0.7 - 4.0 K/uL - 3.6 -     Body mass index is 39.44 kg/m.  Orders:  No orders of the defined types were placed in this encounter.  No orders of the defined types were placed in this encounter.    Procedures: No procedures performed  Clinical Data: No additional findings.  ROS:  All other systems negative, except as noted in the HPI. Review of Systems  Objective: Vital Signs: Ht 6\' 4"  (  1.93 m)   Wt (!) 324 lb (147 kg)   BMI 39.44 kg/m   Specialty Comments:  No specialty comments available.  PMFS History: Patient Active Problem List   Diagnosis Date Noted  . Chronic osteomyelitis of hand including fingers, right (HCC)   . Abscess of bursa of right knee   . Osteomyelitis of finger of right hand (HCC) 03/17/2019  . Non-pressure chronic ulcer of right calf, limited to breakdown of skin (HCC) 10/03/2016  . Status post bilateral below knee amputation (HCC) 05/15/2015  . Infection of below knee amputation stump (HCC) 05/15/2015  . Infection of amputation stump of right lower extremity (HCC) 05/15/2015  . Wound infection 05/15/2015  . Diabetic osteomyelitis (HCC)  08/05/2013  . Cellulitis and abscess of toe of left foot 08/01/2013  . Hyponatremia 08/01/2013  . Hypokalemia 08/01/2013  . Sepsis associated hypotension (HCC) 08/01/2013  . Leukocytosis 08/01/2013  . Cellulitis and abscess of lower leg 12/17/2012  . DM foot ulcer (HCC) 12/17/2012  . DM2 (diabetes mellitus, type 2) (HCC) 12/17/2012  . Diabetic neuropathy (HCC) 12/17/2012   Past Medical History:  Diagnosis Date  . Arthritis    "ankles and feet" (08/19/2013)  . Bronchitis    hx of;last time about 45yrs ago  . Chronic back pain   . Exertional shortness of breath   . EBRAXENM(076.8)    "monthly" (08/19/2013)  . Heart murmur    Hx: of as a child  . Hyperlipidemia    takes Atorvastatin daily  . Neuromuscular disorder (HCC)    diabetic neuropathy in feet  . Type II diabetes mellitus (HCC)    takes Metformin bid and Lantus nightly  . Wears glasses     Family History  Problem Relation Age of Onset  . Other Mother   . Other Father     Past Surgical History:  Procedure Laterality Date  . AMPUTATION Right 01/02/2013   Procedure: AMPUTATION BELOW KNEE;  Surgeon: Nadara Mustard, MD;  [facility];  Service: Orthopedics;  Laterality: Right;  Right Below Knee Amputation  . AMPUTATION Left 04/25/2013   Procedure: AMPUTATION RAY;  Surgeon: Nadara Mustard, MD;  [facility];  Service: Orthopedics;  Laterality: Left;  Left Foot 3rd Ray Amputation  . AMPUTATION Left 08/03/2013   Procedure: Left Midfoot Amputation;  Surgeon: Nadara Mustard, MD;  Location: [facility] OR;  Service: Orthopedics;  Laterality: Left;  Left Foot Transmetatarsal Amputation  . AMPUTATION Left 08/19/2013   Procedure: AMPUTATION BELOW KNEE- left;  Surgeon: Nadara Mustard, MD;  [facility];  Service: Orthopedics;  Laterality: Left;  Left Below Knee Amputation  . AMPUTATION Right 05/16/2015   Procedure: AMPUTATION BELOW KNEE/REVISION;  Surgeon: Nadara Mustard, MD;  [facility];  Service: Orthopedics;  Laterality: Right;  .  AMPUTATION     middle finger right hand  . AMPUTATION Right 03/20/2019   Procedure: REVISION AMPUTATION RIGHT INDEX FINGER;  Surgeon: Nadara Mustard, MD;  Location: [facility] OR;  Service: Orthopedics;  Laterality: Right;  . AMPUTATION Left 06/19/2019   Procedure: LEFT INDEX FINGER PARTIAL AMPUTATION;  Surgeon: Nadara Mustard, MD;  Location: [facility] OR;  Service: Orthopedics;  Laterality: Left;  . BELOW KNEE LEG AMPUTATION Left 08/19/2013  . BELOW KNEE LEG AMPUTATION Right 12/2012  . ESOPHAGOGASTRODUODENOSCOPY    . I&D EXTREMITY Right 03/20/2019   Procedure: IRRIGATION AND DEBRIDEMENT RIGHT PREPATELLAR BURSA;  Surgeon: Nadara Mustard, MD;  Location: [facility] OR;  Service: Orthopedics;  Laterality: Right;  .  IRRIGATION AND DEBRIDEMENT KNEE    . STUMP REVISION Right 05/16/2015  . TOE AMPUTATION Right 2012   at [facility], and removal of partial of second to 2012  . TONSILLECTOMY     Social History   Occupational History  . Not on file  Tobacco Use  . Smoking status: Former Smoker    Years: 2.00    Types: Cigarettes  . Smokeless tobacco: Never Used  . Tobacco comment: quit smoking cigarettes in the 80's  Substance and Sexual Activity  . Alcohol use: Not Currently    Comment:  "couple drinks maybe once/month"  . Drug use: Not Currently    Comment:  "recreational drugs in the 80's "  . Sexual activity: Not Currently

## 2019-07-02 NOTE — Progress Notes (Signed)
Pt denies SOB, chest pain, and being under the care of a cardiologist. Pt denioes having a stress test, echo and cardiac cath. Pt denies having a chest x ray within the last year.  Pt made aware to stop taking taking Aspirin (unless otherwise advised by surgeon), vitamins, fish oil and herbal medications. Do not take any NSAIDs ie: Ibuprofen, Advil, Naproxen (Aleve), Motrin, BC and Goody Powder.  Pt made aware to not take diabetes pills on DOS (Metformin and Actos) and to take only 30 units of Tresiba insulin the morning of surgery if blood glucose is > 70. Pt made aware to check BG every 2 hours prior to arrival to hospital on DOS. Pt made aware to treat a BG < 70 with  4 ounces of apple  juice, wait 15 minutes after intervention to recheck BG, if BG remains < 70, call Short Stay unit to speak with a nurse.  Pt denies that he and family members tested positive for COVID-19 ( pt scheduled to be tested on DOS).  Pt denies that he and family members experienced the following symptoms:  Cough yes/no: No Fever (>100.38F)  yes/no: No Runny nose yes/no: No Sore throat yes/no: No Difficulty breathing/shortness of breath  yes/no: No  Have you or a family member traveled in the last 14 days and where? yes/no: No  Pt reminded that hospital visitation restrictions are in effect and the importance of the restrictions.   Pt verbalized understanding of all pre-op instructions.

## 2019-07-03 ENCOUNTER — Other Ambulatory Visit: Payer: Self-pay

## 2019-07-03 ENCOUNTER — Ambulatory Visit (HOSPITAL_COMMUNITY)
Admission: RE | Admit: 2019-07-03 | Discharge: 2019-07-03 | Disposition: A | Discharge status: Home / Self Care | Payer: Medicare Other | Attending: Orthopedic Surgery | Admitting: Orthopedic Surgery

## 2019-07-03 ENCOUNTER — Other Ambulatory Visit (HOSPITAL_COMMUNITY): Payer: Medicare Other

## 2019-07-03 ENCOUNTER — Encounter (HOSPITAL_COMMUNITY): Payer: Self-pay

## 2019-07-03 ENCOUNTER — Ambulatory Visit (HOSPITAL_COMMUNITY): Payer: Medicare Other | Admitting: Registered Nurse

## 2019-07-03 ENCOUNTER — Encounter (HOSPITAL_COMMUNITY)
Admission: RE | Disposition: A | Discharge status: Home / Self Care | Payer: Self-pay | Source: Home / Self Care | Attending: Orthopedic Surgery

## 2019-07-03 DIAGNOSIS — E119 Type 2 diabetes mellitus without complications: Secondary | ICD-10-CM | POA: Insufficient documentation

## 2019-07-03 DIAGNOSIS — E785 Hyperlipidemia, unspecified: Secondary | ICD-10-CM | POA: Diagnosis not present

## 2019-07-03 DIAGNOSIS — Z20828 Contact with and (suspected) exposure to other viral communicable diseases: Secondary | ICD-10-CM | POA: Diagnosis not present

## 2019-07-03 DIAGNOSIS — Z89511 Acquired absence of right leg below knee: Secondary | ICD-10-CM | POA: Diagnosis not present

## 2019-07-03 DIAGNOSIS — Z89512 Acquired absence of left leg below knee: Secondary | ICD-10-CM | POA: Diagnosis not present

## 2019-07-03 DIAGNOSIS — M869 Osteomyelitis, unspecified: Secondary | ICD-10-CM | POA: Insufficient documentation

## 2019-07-03 DIAGNOSIS — Z87891 Personal history of nicotine dependence: Secondary | ICD-10-CM | POA: Diagnosis not present

## 2019-07-03 DIAGNOSIS — Z6841 Body Mass Index (BMI) 40.0 and over, adult: Secondary | ICD-10-CM | POA: Diagnosis not present

## 2019-07-03 DIAGNOSIS — T8781 Dehiscence of amputation stump: Secondary | ICD-10-CM | POA: Diagnosis not present

## 2019-07-03 DIAGNOSIS — E669 Obesity, unspecified: Secondary | ICD-10-CM | POA: Diagnosis not present

## 2019-07-03 HISTORY — PX: AMPUTATION: SHX166

## 2019-07-03 HISTORY — DX: Osteomyelitis, unspecified: M86.9

## 2019-07-03 LAB — GLUCOSE, CAPILLARY
Glucose-Capillary: 319 mg/dL — ABNORMAL HIGH (ref 70–99)
Glucose-Capillary: 300 mg/dL — ABNORMAL HIGH (ref 70–99)

## 2019-07-03 LAB — CBC
HCT: 33 % — ABNORMAL LOW (ref 39.0–52.0)
Hemoglobin: 10.4 g/dL — ABNORMAL LOW (ref 13.0–17.0)
MCH: 25.7 pg — ABNORMAL LOW (ref 26.0–34.0)
MCHC: 31.5 g/dL (ref 30.0–36.0)
MCV: 81.5 fL (ref 80.0–100.0)
Platelets: 400 10*3/uL (ref 150–400)
RBC: 4.05 MIL/uL — ABNORMAL LOW (ref 4.22–5.81)
RDW: 14.3 % (ref 11.5–15.5)
WBC: 10.2 10*3/uL (ref 4.0–10.5)
nRBC: 0 % (ref 0.0–0.2)

## 2019-07-03 LAB — BASIC METABOLIC PANEL
Anion gap: 11 (ref 5–15)
BUN: 17 mg/dL (ref 6–20)
CO2: 23 mmol/L (ref 22–32)
Calcium: 8.8 mg/dL — ABNORMAL LOW (ref 8.9–10.3)
Chloride: 103 mmol/L (ref 98–111)
Creatinine, Ser: 1.24 mg/dL (ref 0.61–1.24)
GFR calc Af Amer: >60 mL/min (ref >60)
GFR calc non Af Amer: >60 mL/min (ref >60)
Glucose, Bld: 319 mg/dL — ABNORMAL HIGH (ref 70–99)
Potassium: 4.3 mmol/L (ref 3.5–5.1)
Sodium: 137 mmol/L (ref 135–145)

## 2019-07-03 LAB — SARS CORONAVIRUS 2 BY RT PCR (HOSPITAL ORDER, PERFORMED IN ~~LOC~~ HOSPITAL LAB): SARS Coronavirus 2: NEGATIVE

## 2019-07-03 SURGERY — AMPUTATION DIGIT
Anesthesia: Monitor Anesthesia Care | Laterality: Left

## 2019-07-03 MED ORDER — BUPIVACAINE HCL (PF) 0.25 % IJ SOLN
INTRAMUSCULAR | Status: AC
  Filled 2019-07-03: qty 30

## 2019-07-03 MED ORDER — ONDANSETRON HCL 4 MG/2ML IJ SOLN
INTRAMUSCULAR | Status: AC
  Filled 2019-07-03: qty 2

## 2019-07-03 MED ORDER — LACTATED RINGERS IV SOLN
INTRAVENOUS | Status: DC
  Administered 2019-07-03: 12:00:00 via INTRAVENOUS

## 2019-07-03 MED ORDER — DEXAMETHASONE SODIUM PHOSPHATE 10 MG/ML IJ SOLN
INTRAMUSCULAR | Status: AC
  Filled 2019-07-03: qty 1

## 2019-07-03 MED ORDER — INSULIN ASPART 100 UNIT/ML ~~LOC~~ SOLN
SUBCUTANEOUS | Status: AC
  Administered 2019-07-03: 10 [IU] via SUBCUTANEOUS
  Filled 2019-07-03: qty 1

## 2019-07-03 MED ORDER — LIDOCAINE 2% (20 MG/ML) 5 ML SYRINGE
INTRAMUSCULAR | Status: AC
  Filled 2019-07-03: qty 5

## 2019-07-03 MED ORDER — BUPIVACAINE HCL (PF) 0.25 % IJ SOLN
INTRAMUSCULAR | Status: DC | PRN
  Administered 2019-07-03: 10 mL

## 2019-07-03 MED ORDER — MIDAZOLAM HCL 5 MG/5ML IJ SOLN
INTRAMUSCULAR | Status: DC | PRN
  Administered 2019-07-03: 2 mg via INTRAVENOUS

## 2019-07-03 MED ORDER — INSULIN ASPART 100 UNIT/ML ~~LOC~~ SOLN
10.0000 [IU] | Freq: Once | SUBCUTANEOUS | Status: AC
  Administered 2019-07-03: 12:00:00 10 [IU] via SUBCUTANEOUS

## 2019-07-03 MED ORDER — PHENYLEPHRINE 40 MCG/ML (10ML) SYRINGE FOR IV PUSH (FOR BLOOD PRESSURE SUPPORT)
PREFILLED_SYRINGE | INTRAVENOUS | Status: AC
  Filled 2019-07-03: qty 10

## 2019-07-03 MED ORDER — MIDAZOLAM HCL 2 MG/2ML IJ SOLN
INTRAMUSCULAR | Status: AC
  Filled 2019-07-03: qty 2

## 2019-07-03 MED ORDER — DEXTROSE 5 % IV SOLN
3.0000 g | INTRAVENOUS | Status: AC
  Administered 2019-07-03: 3 g via INTRAVENOUS
  Filled 2019-07-03: qty 3

## 2019-07-03 MED ORDER — DEXMEDETOMIDINE HCL 200 MCG/2ML IV SOLN
INTRAVENOUS | Status: DC | PRN
  Administered 2019-07-03: 80 ug via INTRAVENOUS

## 2019-07-03 MED ORDER — LACTATED RINGERS IV SOLN
INTRAVENOUS | Status: DC | PRN
  Administered 2019-07-03: 12:00:00 via INTRAVENOUS

## 2019-07-03 MED ORDER — 0.9 % SODIUM CHLORIDE (POUR BTL) OPTIME
TOPICAL | Status: DC | PRN
  Administered 2019-07-03: 14:00:00 1000 mL

## 2019-07-03 MED ORDER — FENTANYL CITRATE (PF) 100 MCG/2ML IJ SOLN
INTRAMUSCULAR | Status: DC | PRN
  Administered 2019-07-03: 50 ug via INTRAVENOUS

## 2019-07-03 MED ORDER — CHLORHEXIDINE GLUCONATE 4 % EX LIQD: 60.0000 mL | Freq: Once | CUTANEOUS | Status: DC

## 2019-07-03 MED ORDER — PROPOFOL 500 MG/50ML IV EMUL
INTRAVENOUS | Status: DC | PRN
  Administered 2019-07-03: 75 ug/kg/min via INTRAVENOUS

## 2019-07-03 MED ORDER — FENTANYL CITRATE (PF) 250 MCG/5ML IJ SOLN
INTRAMUSCULAR | Status: AC
  Filled 2019-07-03: qty 5

## 2019-07-03 SURGICAL SUPPLY — 26 items
BLADE SURG 21 STRL SS (BLADE) ×2 IMPLANT
BNDG COHESIVE 4X5 TAN STRL (GAUZE/BANDAGES/DRESSINGS) ×2 IMPLANT
BNDG GAUZE ELAST 4 BULKY (GAUZE/BANDAGES/DRESSINGS) ×2 IMPLANT
CANISTER SUCTION 2500CC (MISCELLANEOUS) ×1 IMPLANT
COVER SURGICAL LIGHT HANDLE (MISCELLANEOUS) ×3 IMPLANT
DRAPE U-SHAPE 47X51 STRL (DRAPES) ×2 IMPLANT
DRSG ADAPTIC 3X8 NADH LF (GAUZE/BANDAGES/DRESSINGS) IMPLANT
DRSG EMULSION OIL 3X3 NADH (GAUZE/BANDAGES/DRESSINGS) ×1 IMPLANT
DRSG PAD ABDOMINAL 8X10 ST (GAUZE/BANDAGES/DRESSINGS) ×2 IMPLANT
DURAPREP 26ML APPLICATOR (WOUND CARE) ×2 IMPLANT
ELECT REM PT RETURN 9FT ADLT (ELECTROSURGICAL) ×2
ELECTRODE REM PT RTRN 9FT ADLT (ELECTROSURGICAL) ×1 IMPLANT
GAUZE SPONGE 4X4 12PLY STRL (GAUZE/BANDAGES/DRESSINGS) IMPLANT
GAUZE SPONGE 4X4 12PLY STRL LF (GAUZE/BANDAGES/DRESSINGS) ×1 IMPLANT
GLOVE BIOGEL PI IND STRL 9 (GLOVE) ×1 IMPLANT
GLOVE BIOGEL PI INDICATOR 9 (GLOVE) ×1
GLOVE SURG ORTHO 9.0 STRL STRW (GLOVE) ×2 IMPLANT
GOWN STRL REUS W/ TWL XL LVL3 (GOWN DISPOSABLE) ×2 IMPLANT
GOWN STRL REUS W/TWL XL LVL3 (GOWN DISPOSABLE) ×2
KIT BASIN OR (CUSTOM PROCEDURE TRAY) ×2 IMPLANT
KIT TURNOVER KIT B (KITS) ×2 IMPLANT
NS IRRIG 1000ML POUR BTL (IV SOLUTION) ×2 IMPLANT
PACK ORTHO EXTREMITY (CUSTOM PROCEDURE TRAY) ×2 IMPLANT
PAD ARMBOARD 7.5X6 YLW CONV (MISCELLANEOUS) ×4 IMPLANT
SUT ETHILON 2 0 PSLX (SUTURE) ×2 IMPLANT
TOWEL GREEN STERILE (TOWEL DISPOSABLE) ×2 IMPLANT

## 2019-07-03 NOTE — Progress Notes (Signed)
Patient prepared for discharge; IV d/c'c;  Witnessed AMA papers signed by patient; patient placed in wheelchair and transported to [place] parking. Dr Linna Caprice notified of status.

## 2019-07-03 NOTE — Progress Notes (Signed)
Anesthesiology Note:  Gabriel Howe is a 58 year old male with osteomyelitis of the left index finger who underwent amputation of his left index finger today by Dr. Sharol Given  under [place] anesthesia.  The patient received propofol, Preceedex, and fentanyl intraoperatively.  In the recovery room, the patient stated he wished to leave and drive himself home to [place] where he lives.  We stated emphatically that this was not allowed under hospital policy and that it was unsafe for him to operate an automobile after sedation or  anesthesia.  The patient wished to sign out [place].  He signed the papers and left the hospital under his own volition.  Should Gabriel Howe return to [facility] for further surgery requiring anesthesia or sedation, he will have to agree to arrange either transportation home or stay overnight following his surgery.  If he refuses to accept these conditions,  then he will not have surgery at [facility]  Roberts Gaudy, MD

## 2019-07-03 NOTE — Anesthesia Preprocedure Evaluation (Signed)
Anesthesia Evaluation  Patient identified by MRN, date of birth, ID band Patient awake    Reviewed: Allergy & Precautions, NPO status , Patient's Chart, lab work & pertinent test results  Airway Mallampati: III  TM Distance: >3 FB Neck ROM: Full    Dental  (+) Teeth Intact, Dental Advisory Given   Pulmonary former smoker,    breath sounds clear to auscultation       Cardiovascular  Rhythm:Regular Rate:Normal     Neuro/Psych    GI/Hepatic   Endo/Other  diabetes  Renal/GU      Musculoskeletal   Abdominal (+) + obese,   Peds  Hematology   Anesthesia Other Findings   Reproductive/Obstetrics                             Anesthesia Physical Anesthesia Plan  ASA: III  Anesthesia Plan: MAC   Post-op Pain Management:    Induction:   PONV Risk Score and Plan: Propofol infusion  Airway Management Planned: Natural Airway and Simple Face Mask  Additional Equipment:   Intra-op Plan:   Post-operative Plan:   Informed Consent: I have reviewed the patients History and Physical, chart, labs and discussed the procedure including the risks, benefits and alternatives for the proposed anesthesia with the patient or authorized representative who has indicated his/her understanding and acceptance.     Dental advisory given  Plan Discussed with: CRNA and Anesthesiologist  Anesthesia Plan Comments:         Anesthesia Quick Evaluation

## 2019-07-03 NOTE — Transfer of Care (Signed)
Immediate Anesthesia Transfer of Care Note  Patient: Gabriel Howe  Procedure(s) Performed: AMPUTATION LEFT INDEX FINGER (Left )  Patient Location: PACU  Anesthesia Type:MAC  Level of Consciousness: awake, alert  and oriented  Airway & Oxygen Therapy: Patient Spontanous Breathing  Post-op Assessment: Report given to RN and Post -op Vital signs reviewed and stable  Post vital signs: Reviewed and stable  Last Vitals:  Vitals Value Taken Time  BP 95/69 07/03/19 1358  Temp 36.4 C 07/03/19 1358  Pulse 80 07/03/19 1403  Resp 14 07/03/19 1403  SpO2 96 % 07/03/19 1403  Vitals shown include unvalidated device data.  Last Pain:  Vitals:   07/03/19 1053  TempSrc: Oral         Complications: No apparent anesthesia complications

## 2019-07-03 NOTE — Anesthesia Postprocedure Evaluation (Signed)
Anesthesia Post Note  Patient: Gabriel Howe  Procedure(s) Performed: AMPUTATION LEFT INDEX FINGER (Left )     Patient location during evaluation: PACU Anesthesia Type: MAC Level of consciousness: awake, awake and alert and oriented Pain management: pain level controlled Vital Signs Assessment: post-procedure vital signs reviewed and stable Respiratory status: spontaneous breathing, nonlabored ventilation and respiratory function stable Cardiovascular status: blood pressure returned to baseline Anesthetic complications: no Comments: Patient signed out against medical advice. See progress notes.    Last Vitals:  Vitals:   07/03/19 1415 07/03/19 1425  BP: 111/62 (!) 152/94  Pulse: 76 82  Resp: 15   Temp:  (!) 36.2 C  SpO2: 95% 98%    Last Pain:  Vitals:   07/03/19 1410  TempSrc:   PainSc: 0-No pain                 [name] COKER

## 2019-07-03 NOTE — Anesthesia Procedure Notes (Signed)
Procedure Name: MAC Performed by: Valda Favia, CRNA Pre-anesthesia Checklist: Patient identified, Emergency Drugs available, Suction available and Patient being monitored Patient Re-evaluated:Patient Re-evaluated prior to induction Oxygen Delivery Method: Simple face mask Induction Type: IV induction Placement Confirmation: positive ETCO2 and breath sounds checked- equal and bilateral Dental Injury: Teeth and Oropharynx as per pre-operative assessment

## 2019-07-03 NOTE — Op Note (Signed)
07/03/2019  2:17 PM  PATIENT:  Gabriel Howe    PRE-OPERATIVE DIAGNOSIS:  Osteomyelitis Left Index Finger  POST-OPERATIVE DIAGNOSIS:  Same  PROCEDURE:  AMPUTATION LEFT INDEX FINGER  SURGEON:  Newt Minion, MD  PHYSICIAN ASSISTANT:None ANESTHESIA:   General  PREOPERATIVE INDICATIONS:  Wray Goehring is a  58 y.o. male with a diagnosis of Osteomyelitis Left Index Finger who failed conservative measures and elected for surgical management.    The risks benefits and alternatives were discussed with the patient preoperatively including but not limited to the risks of infection, bleeding, nerve injury, cardiopulmonary complications, the need for revision surgery, among others, and the patient was willing to proceed.  OPERATIVE IMPLANTS: None.  _0 @  OPERATIVE FINDINGS: Necrotic flexor tendon synovitis extending up to the MCP joint.  OPERATIVE PROCEDURE: Patient was brought to the operating room and underwent a MAC anesthetic.  After adequate levels anesthesia were obtained patient's left upper extremity was prepped using DuraPrep draped into a sterile field a timeout was called.  An amputation was performed through the middle phalanx of the left index finger.  Examination at this level showed necrosis of the flexor tendon as well as necrosis of the soft tissue.  Amputation was then extended back to the MCP joint.  The flexor tendon was pulled back to healthy tendon cut and allowed to retract.  There is no ascending purulence into the flexor sheath.  The soft tissue was healthy viable had good bleeding.  The wound was irrigated with 1 L of normal saline.  The incision was closed using 2-0 nylon sterile dressing was applied patient received a digital block with 10 cc of quarter percent Marcaine plain he was taken the PACU in stable condition.   DISCHARGE PLANNING:  Antibiotic duration: Continue doxycycline at home  Weightbearing: Not applicable  Pain medication: Patient has Vicodin  at home if needed  Dressing care/ Wound VAC: Change dressing in 5 days  Ambulatory devices: Not applicable  Discharge to: Home.  Follow-up: In the office 1 week post operative.

## 2019-07-03 NOTE — Progress Notes (Addendum)
Patient presented to PACU Awake and alert. When asked who will be driving him home and staying with him. Patients response," My parents will be there and I will be driving myself home".  Patient instructed he is not able to drive home because of the medications he received with the surgery. Patient adamant the he was going home and requested AMA papers. DR Linna Caprice notified of status and en route to bedside.

## 2019-07-03 NOTE — Progress Notes (Signed)
Patient's blood sugar 319.  Dr. Linna Caprice aware.  Gave verbal order to give 10 units Novolog.  Will continue to monitor patient.

## 2019-07-03 NOTE — H&P (Signed)
Gabriel Howe is an 58 y.o. male.   Chief Complaint: Dehiscence left distal index finger amputation HPI: Patient is a 58 year old gentleman who underwent amputation of his distal index finger for infection several weeks ago.  He has had progressive index finger wound dehiscence and has exposed bone of the index finger of the middle phalanx.  He presents today for revision with amputation most likely proximal to the PIP joint.  Past Medical History:  Diagnosis Date  . Arthritis    "ankles and feet" (08/19/2013)  . Bronchitis    hx of;last time about 4470yrs ago  . Chronic back pain   . Exertional shortness of breath   . UXLKGMWN(027.2Headache(784.0)    "monthly" (08/19/2013)  . Heart murmur    Hx: of as a child  . Hyperlipidemia    takes Atorvastatin daily  . Neuromuscular disorder (HCC)    diabetic neuropathy in feet  . Osteomyelitis (HCC)    left index finger  . Type II diabetes mellitus (HCC)    takes Metformin bid and Lantus nightly  . Wears glasses     Past Surgical History:  Procedure Laterality Date  . AMPUTATION Right 01/02/2013   Procedure: AMPUTATION BELOW KNEE;  Surgeon: Nadara MustardMarcus V Duda, MD;  [facility];  Service: Orthopedics;  Laterality: Right;  Right Below Knee Amputation  . AMPUTATION Left 04/25/2013   Procedure: AMPUTATION RAY;  Surgeon: Nadara MustardMarcus V Duda, MD;  [facility];  Service: Orthopedics;  Laterality: Left;  Left Foot 3rd Ray Amputation  . AMPUTATION Left 08/03/2013   Procedure: Left Midfoot Amputation;  Surgeon: Nadara MustardMarcus V Duda, MD;  [facility];  Service: Orthopedics;  Laterality: Left;  Left Foot Transmetatarsal Amputation  . AMPUTATION Left 08/19/2013   Procedure: AMPUTATION BELOW KNEE- left;  Surgeon: Nadara MustardMarcus V Duda, MD;  [facility];  Service: Orthopedics;  Laterality: Left;  Left Below Knee Amputation  . AMPUTATION Right 05/16/2015   Procedure: AMPUTATION BELOW KNEE/REVISION;  Surgeon: Nadara MustardMarcus Duda V, MD;  [facility];  Service: Orthopedics;  Laterality:  Right;  . AMPUTATION     middle finger right hand  . AMPUTATION Right 03/20/2019   Procedure: REVISION AMPUTATION RIGHT INDEX FINGER;  Surgeon: Nadara Mustarduda, Marcus V, MD;  [facility];  Service: Orthopedics;  Laterality: Right;  . AMPUTATION Left 06/19/2019   Procedure: LEFT INDEX FINGER PARTIAL AMPUTATION;  Surgeon: Nadara Mustarduda, Marcus V, MD;  [facility];  Service: Orthopedics;  Laterality: Left;  . BELOW KNEE LEG AMPUTATION Left 08/19/2013  . BELOW KNEE LEG AMPUTATION Right 12/2012  . ESOPHAGOGASTRODUODENOSCOPY    . I&D EXTREMITY Right 03/20/2019   Procedure: IRRIGATION AND DEBRIDEMENT RIGHT PREPATELLAR BURSA;  Surgeon: Nadara Mustarduda, Marcus V, MD;  [facility];  Service: Orthopedics;  Laterality: Right;  . IRRIGATION AND DEBRIDEMENT KNEE    . STUMP REVISION Right 05/16/2015  . TOE AMPUTATION Right 2012   at [facility], and removal of partial of second to 2012  . TONSILLECTOMY      Family History  Problem Relation Age of Onset  . Other Mother   . Other Father    Social History:  reports that he has quit smoking. His smoking use included cigarettes. He quit after 2.00 years of use. He has never used smokeless tobacco. He reports previous alcohol use. He reports previous drug use.  Allergies:  Allergies  Allergen Reactions  . Bactroban [Mupirocin Calcium] Hives and Swelling  . Sulfamethoxazole-Trimethoprim Hives  . Shellfish Allergy Rash    No medications  prior to admission.    No results found for this or any previous visit (from the past 48 hour(s)). No results found.  Review of Systems  All other systems reviewed and are negative.   There were no vitals taken for this visit. Physical Exam  Constitutional: He is oriented to person, place, and time. He appears well-developed and well-nourished. No distress.  HENT:  Head: Normocephalic and atraumatic.  Neck: No tracheal deviation present. No thyromegaly present.  Respiratory: Effort normal. No stridor. No respiratory distress.  GI:  Soft. He exhibits no distension.  Musculoskeletal:     Comments: Left index finger-Examination patient has dehiscence of the amputated stump left index finger.  There is exposed bone there is necrotic tissue.  There is cellulitis up to the MCP joint.  Neurological: He is alert and oriented to person, place, and time. No cranial nerve deficit.  Skin: Skin is warm.  Psychiatric: He has a normal mood and affect. His behavior is normal. Judgment and thought content normal.     Assessment/Plan Dehiscence of left index finger amputation site- plan revision amputation left index finger-the procedure and benefits and risk including the risk of bleeding, infection, neurovascular injury, possible need for further surgery were discussed with the patient and his questions were answered to his satisfaction.  The patient wishes to proceed with surgery at this time.  Erlinda Hong, PA-C 07/03/2019, 7:56 AM [facility] [phone]

## 2019-07-07 ENCOUNTER — Encounter (HOSPITAL_COMMUNITY): Payer: Self-pay | Admitting: Orthopedic Surgery

## 2019-07-20 ENCOUNTER — Encounter: Payer: Self-pay | Admitting: Orthopedic Surgery

## 2019-07-20 ENCOUNTER — Ambulatory Visit (INDEPENDENT_AMBULATORY_CARE_PROVIDER_SITE_OTHER): Payer: Medicare Other | Admitting: Orthopedic Surgery

## 2019-07-20 VITALS — Ht 76.0 in | Wt 380.0 lb

## 2019-07-20 DIAGNOSIS — M869 Osteomyelitis, unspecified: Secondary | ICD-10-CM

## 2019-07-20 MED ORDER — AMOXICILLIN-POT CLAVULANATE 875-125 MG PO TABS: 1.0000 | ORAL_TABLET | Freq: Two times a day (BID) | ORAL | 0 refills | Status: AC

## 2019-07-20 NOTE — Progress Notes (Signed)
Office Visit Note   Patient: Gabriel Howe           Date of Birth: [birth year]           MRN: [id number] Visit Date: 07/20/2019              Requested by: No referring provider defined for this encounter. PCP: System, Pcp Not In  Chief Complaint  Patient presents with  . Left Hand - Routine Post Op    07/03/2019 Left Index Finger Amputation      HPI: Patient is a 58 year old gentleman who presents in follow-up for left index finger amputation through the MCP joint.  He states he is doing well denies any pain the sutures are intact.  Patient recently has been on doxycycline and sulfamethoxazole trimethoprim.  Assessment & Plan: Visit Diagnoses:  1. Osteomyelitis of finger of left hand (HCC)     Plan: We will call in a prescription for Augmentin we will harvest the sutures.  Follow-Up Instructions: Return in about 1 week (around 07/27/2019).   Ortho Exam  Patient is alert, oriented, no adenopathy, well-dressed, normal affect, normal respiratory effort. Examination patient does have cellulitis around the incision there is no drainage no cellulitis the wound edges are well approximated.  We will harvest the sutures today he does have cellulitis but it is not tender to palpation.  Will prescribe Augmentin.  Imaging: No results found. No images are attached to the encounter.  Labs: Lab Results  Component Value Date   HGBA1C 7.2 (H) 05/15/2015   HGBA1C 9.0 (H) 08/01/2013   HGBA1C 12.5 (H) 12/17/2012   REPTSTATUS 03/25/2019 FINAL 03/20/2019   GRAMSTAIN  03/20/2019    ABUNDANT WBC PRESENT,BOTH PMN AND MONONUCLEAR NO ORGANISMS SEEN    CULT  03/20/2019    RARE GROUP B STREP(S.AGALACTIAE)ISOLATED TESTING AGAINST S. AGALACTIAE NOT ROUTINELY PERFORMED DUE TO PREDICTABILITY OF AMP/PEN/VAN SUSCEPTIBILITY. RARE STREPTOCOCCUS CONSTELLATUS RESULT CALLED TO, READ BACK BY AND VERIFIED WITH: Enrigue Catena [id number] MLM NO ANAEROBES ISOLATED Performed at [facility], [address] [place], [place] [address]      Lab Results  Component Value Date   ALBUMIN 3.2 (L) 03/20/2019   ALBUMIN 1.9 (L) 05/16/2015   ALBUMIN 2.6 (L) 08/19/2013    Lab Results  Component Value Date   MG 2.0 08/03/2013   No results found for: VD25OH  No results found for: PREALBUMIN CBC EXTENDED Latest Ref Rng & Units 07/03/2019 06/19/2019 03/20/2019  WBC 4.0 - 10.5 K/uL 10.2 10.6(H) 13.0(H)  RBC 4.22 - 5.81 MIL/uL 4.05(L) 5.02 5.28  HGB 13.0 - 17.0 g/dL 10.4(L) 13.0 13.5  HCT 39.0 - 52.0 % 33.0(L) 41.0 43.2  PLT 150 - 400 K/uL 400 303 333  NEUTROABS 1.7 - 7.7 K/uL - - 8.1(H)  LYMPHSABS 0.7 - 4.0 K/uL - - 3.6     Body mass index is 46.26 kg/m.  Orders:  No orders of the defined types were placed in this encounter.  Meds ordered this encounter  Medications  . amoxicillin-clavulanate (AUGMENTIN) 875-125 MG tablet    Sig: Take 1 tablet by mouth 2 (two) times daily.    Dispense:  28 tablet    Refill:  0     Procedures: No procedures performed  Clinical Data: No additional findings.  ROS:  All other systems negative, except as noted in the HPI. Review of Systems  Objective: Vital Signs: Ht 6\' 4"  (1.93 m)   Wt (!) 380 lb (172.4 kg)  BMI 46.26 kg/m   Specialty Comments:  No specialty comments available.  PMFS History: Patient Active Problem List   Diagnosis Date Noted  . Dehiscence of amputation stump (HCC)   . Chronic osteomyelitis of hand including fingers, right (HCC)   . Abscess of bursa of right knee   . Osteomyelitis of finger of right hand (HCC) 03/17/2019  . Non-pressure chronic ulcer of right calf, limited to breakdown of skin (HCC) 10/03/2016  . Status post bilateral below knee amputation (HCC) 05/15/2015  . Infection of below knee amputation stump (HCC) 05/15/2015  . Infection of amputation stump of right lower extremity (HCC) 05/15/2015  . Wound infection 05/15/2015  . Diabetic osteomyelitis (HCC) 08/05/2013  . Cellulitis and abscess of toe of  left foot 08/01/2013  . Hyponatremia 08/01/2013  . Hypokalemia 08/01/2013  . Sepsis associated hypotension (HCC) 08/01/2013  . Leukocytosis 08/01/2013  . Cellulitis and abscess of lower leg 12/17/2012  . DM foot ulcer (HCC) 12/17/2012  . DM2 (diabetes mellitus, type 2) (HCC) 12/17/2012  . Diabetic neuropathy (HCC) 12/17/2012   Past Medical History:  Diagnosis Date  . Arthritis    "ankles and feet" (08/19/2013)  . Bronchitis    hx of;last time about 3051yrs ago  . Chronic back pain   . Exertional shortness of breath   . ZOXWRUEA(540.9Headache(784.0)    "monthly" (08/19/2013)  . Heart murmur    Hx: of as a child  . Hyperlipidemia    takes Atorvastatin daily  . Neuromuscular disorder (HCC)    diabetic neuropathy in feet  . Osteomyelitis (HCC)    left index finger  . Type II diabetes mellitus (HCC)    takes Metformin bid and Lantus nightly  . Wears glasses     Family History  Problem Relation Age of Onset  . Other Mother   . Other Father     Past Surgical History:  Procedure Laterality Date  . AMPUTATION Right 01/02/2013   Procedure: AMPUTATION BELOW KNEE;  Surgeon: Nadara MustardMarcus V Duda, MD;  [facility];  Service: Orthopedics;  Laterality: Right;  Right Below Knee Amputation  . AMPUTATION Left 04/25/2013   Procedure: AMPUTATION RAY;  Surgeon: Nadara MustardMarcus V Duda, MD;  [facility];  Service: Orthopedics;  Laterality: Left;  Left Foot 3rd Ray Amputation  . AMPUTATION Left 08/03/2013   Procedure: Left Midfoot Amputation;  Surgeon: Nadara MustardMarcus V Duda, MD;  [facility];  Service: Orthopedics;  Laterality: Left;  Left Foot Transmetatarsal Amputation  . AMPUTATION Left 08/19/2013   Procedure: AMPUTATION BELOW KNEE- left;  Surgeon: Nadara MustardMarcus V Duda, MD;  [facility];  Service: Orthopedics;  Laterality: Left;  Left Below Knee Amputation  . AMPUTATION Right 05/16/2015   Procedure: AMPUTATION BELOW KNEE/REVISION;  Surgeon: Nadara MustardMarcus Duda V, MD;  [facility];  Service: Orthopedics;  Laterality: Right;  .  AMPUTATION     middle finger right hand  . AMPUTATION Right 03/20/2019   Procedure: REVISION AMPUTATION RIGHT INDEX FINGER;  Surgeon: Nadara Mustarduda, Marcus V, MD;  [facility];  Service: Orthopedics;  Laterality: Right;  . AMPUTATION Left 06/19/2019   Procedure: LEFT INDEX FINGER PARTIAL AMPUTATION;  Surgeon: Nadara Mustarduda, Marcus V, MD;  [facility];  Service: Orthopedics;  Laterality: Left;  . AMPUTATION Left 07/03/2019   Procedure: AMPUTATION LEFT INDEX FINGER;  Surgeon: Nadara Mustarduda, Marcus V, MD;  [facility];  Service: Orthopedics;  Laterality: Left;  . BELOW KNEE LEG AMPUTATION Left 08/19/2013  . BELOW KNEE LEG AMPUTATION Right 12/2012  .  ESOPHAGOGASTRODUODENOSCOPY    . I&D EXTREMITY Right 03/20/2019   Procedure: IRRIGATION AND DEBRIDEMENT RIGHT PREPATELLAR BURSA;  Surgeon: Newt Minion, MD;  Location: [place];  Service: Orthopedics;  Laterality: Right;  . IRRIGATION AND DEBRIDEMENT KNEE    . STUMP REVISION Right 05/16/2015  . TOE AMPUTATION Right 2012   at [place], and removal of partial of second to 2012  . TONSILLECTOMY     Social History   Occupational History  . Not on file  Tobacco Use  . Smoking status: Former Smoker    Years: 2.00    Types: Cigarettes  . Smokeless tobacco: Never Used  . Tobacco comment: quit smoking cigarettes in the 80's  Substance and Sexual Activity  . Alcohol use: Not Currently    Comment:  "couple drinks maybe once/month"  . Drug use: Not Currently    Comment:  "recreational drugs in the 80's "  . Sexual activity: Not Currently

## 2019-07-21 ENCOUNTER — Telehealth: Payer: Self-pay | Admitting: Orthopedic Surgery

## 2019-07-21 NOTE — Telephone Encounter (Signed)
Gabriel Howe from [facility] called in regards to a detailed order for Prosthetic supplies.  CB#[phone].  Thank you.

## 2019-07-25 ENCOUNTER — Encounter: Payer: Self-pay | Admitting: Orthopedic Surgery

## 2019-07-25 NOTE — Progress Notes (Signed)
Office Visit Note   Patient: Gabriel Howe           Date of Birth: Mar 23, 1961           MRN: [id number] Visit Date: 06/29/2019              Requested by: No referring provider defined for this encounter. PCP: System, Pcp Not In  Chief Complaint  Patient presents with  . Left Hand - Routine Post Op    06/19/19 partial left index finger amputation       HPI: Patient is a 58 year old gentleman who presents 10 days status post partial amputation left index finger.  Patient complains of redness swelling cellulitis and pain.  He is currently on doxycycline twice a day.  Assessment & Plan: Visit Diagnoses:  1. Osteomyelitis of finger of left hand ([place])     Plan: With the wound dehiscence and cellulitis have recommended proceeding with revision amputation.  Follow-Up Instructions: Return in about 1 week (around 07/06/2019).   Ortho Exam  Patient is alert, oriented, no adenopathy, well-dressed, normal affect, normal respiratory effort. Examination patient has dehiscence cellulitis pain and swelling around the partial amputation left index finger.  Imaging: No results found. No images are attached to the encounter.  Labs: Lab Results  Component Value Date   HGBA1C 7.2 (H) 05/15/2015   HGBA1C 9.0 (H) 08/01/2013   HGBA1C 12.5 (H) 12/17/2012   REPTSTATUS 03/25/2019 FINAL 03/20/2019   GRAMSTAIN  03/20/2019    ABUNDANT WBC PRESENT,BOTH PMN AND MONONUCLEAR NO ORGANISMS SEEN    CULT  03/20/2019    RARE GROUP B STREP(S.AGALACTIAE)ISOLATED TESTING AGAINST S. AGALACTIAE NOT ROUTINELY PERFORMED DUE TO PREDICTABILITY OF AMP/PEN/VAN SUSCEPTIBILITY. RARE STREPTOCOCCUS CONSTELLATUS RESULT CALLED TO, READ BACK BY AND VERIFIED WITH: Othelia Pulling [id number] MLM NO ANAEROBES ISOLATED Performed at [facility], [place] [address], [place], [place] [address]      Lab Results  Component Value Date   ALBUMIN 3.2 (L) 03/20/2019   ALBUMIN 1.9 (L) 05/16/2015   ALBUMIN 2.6 (L) 08/19/2013     Lab Results  Component Value Date   MG 2.0 08/03/2013   No results found for: VD25OH  No results found for: PREALBUMIN CBC EXTENDED Latest Ref Rng & Units 07/03/2019 06/19/2019 03/20/2019  WBC 4.0 - 10.5 K/uL 10.2 10.6(H) 13.0(H)  RBC 4.22 - 5.81 MIL/uL 4.05(L) 5.02 5.28  HGB 13.0 - 17.0 g/dL 10.4(L) 13.0 13.5  HCT 39.0 - 52.0 % 33.0(L) 41.0 43.2  PLT 150 - 400 K/uL 400 303 333  NEUTROABS 1.7 - 7.7 K/uL - - 8.1(H)  LYMPHSABS 0.7 - 4.0 K/uL - - 3.6     Body mass index is 39.44 kg/m.  Orders:  No orders of the defined types were placed in this encounter.  No orders of the defined types were placed in this encounter.    Procedures: No procedures performed  Clinical Data: No additional findings.  ROS:  All other systems negative, except as noted in the HPI. Review of Systems  Objective: Vital Signs: Ht 6\' 4"  (1.93 m)   Wt (!) 324 lb (147 kg)   BMI 39.44 kg/m   Specialty Comments:  No specialty comments available.  PMFS History: Patient Active Problem List   Diagnosis Date Noted  . Dehiscence of amputation stump ([place])   . Chronic osteomyelitis of hand including fingers, right ([place])   . Abscess of bursa of right knee   . Osteomyelitis of finger of right hand ([place]) 03/17/2019  .  Non-pressure chronic ulcer of right calf, limited to breakdown of skin (HCC) 10/03/2016  . Status post bilateral below knee amputation (HCC) 05/15/2015  . Infection of below knee amputation stump (HCC) 05/15/2015  . Infection of amputation stump of right lower extremity (HCC) 05/15/2015  . Wound infection 05/15/2015  . Diabetic osteomyelitis (HCC) 08/05/2013  . Cellulitis and abscess of toe of left foot 08/01/2013  . Hyponatremia 08/01/2013  . Hypokalemia 08/01/2013  . Sepsis associated hypotension (HCC) 08/01/2013  . Leukocytosis 08/01/2013  . Cellulitis and abscess of lower leg 12/17/2012  . DM foot ulcer (HCC) 12/17/2012  . DM2 (diabetes mellitus, type 2) (HCC) 12/17/2012  .  Diabetic neuropathy (HCC) 12/17/2012   Past Medical History:  Diagnosis Date  . Arthritis    "ankles and feet" (08/19/2013)  . Bronchitis    hx of;last time about 4755yrs ago  . Chronic back pain   . Exertional shortness of breath   . ZOXWRUEA(540.9Headache(784.0)    "monthly" (08/19/2013)  . Heart murmur    Hx: of as a child  . Hyperlipidemia    takes Atorvastatin daily  . Neuromuscular disorder (HCC)    diabetic neuropathy in feet  . Osteomyelitis (HCC)    left index finger  . Type II diabetes mellitus (HCC)    takes Metformin bid and Lantus nightly  . Wears glasses     Family History  Problem Relation Age of Onset  . Other Mother   . Other Father     Past Surgical History:  Procedure Laterality Date  . AMPUTATION Right 01/02/2013   Procedure: AMPUTATION BELOW KNEE;  Surgeon: Nadara MustardMarcus V Shevonne Wolf, MD;  [facility];  Service: Orthopedics;  Laterality: Right;  Right Below Knee Amputation  . AMPUTATION Left 04/25/2013   Procedure: AMPUTATION RAY;  Surgeon: Nadara MustardMarcus V Olimpia Tinch, MD;  [facility];  Service: Orthopedics;  Laterality: Left;  Left Foot 3rd Ray Amputation  . AMPUTATION Left 08/03/2013   Procedure: Left Midfoot Amputation;  Surgeon: Nadara MustardMarcus V Kamare Caspers, MD;  [facility];  Service: Orthopedics;  Laterality: Left;  Left Foot Transmetatarsal Amputation  . AMPUTATION Left 08/19/2013   Procedure: AMPUTATION BELOW KNEE- left;  Surgeon: Nadara MustardMarcus V Fadel Clason, MD;  [facility];  Service: Orthopedics;  Laterality: Left;  Left Below Knee Amputation  . AMPUTATION Right 05/16/2015   Procedure: AMPUTATION BELOW KNEE/REVISION;  Surgeon: Nadara MustardMarcus Era Parr V, MD;  [facility];  Service: Orthopedics;  Laterality: Right;  . AMPUTATION     middle finger right hand  . AMPUTATION Right 03/20/2019   Procedure: REVISION AMPUTATION RIGHT INDEX FINGER;  Surgeon: Nadara Mustarduda, Janessa Mickle V, MD;  [facility];  Service: Orthopedics;  Laterality: Right;  . AMPUTATION Left 06/19/2019   Procedure: LEFT INDEX FINGER PARTIAL AMPUTATION;   Surgeon: Nadara Mustarduda, Dusti Tetro V, MD;  [facility];  Service: Orthopedics;  Laterality: Left;  . AMPUTATION Left 07/03/2019   Procedure: AMPUTATION LEFT INDEX FINGER;  Surgeon: Nadara Mustarduda, Aniqua Briere V, MD;  [facility];  Service: Orthopedics;  Laterality: Left;  . BELOW KNEE LEG AMPUTATION Left 08/19/2013  . BELOW KNEE LEG AMPUTATION Right 12/2012  . ESOPHAGOGASTRODUODENOSCOPY    . I&D EXTREMITY Right 03/20/2019   Procedure: IRRIGATION AND DEBRIDEMENT RIGHT PREPATELLAR BURSA;  Surgeon: Nadara Mustarduda, Krithi Bray V, MD;  [facility];  Service: Orthopedics;  Laterality: Right;  . IRRIGATION AND DEBRIDEMENT KNEE    . STUMP REVISION Right 05/16/2015  . TOE AMPUTATION Right 2012   at [facility], and removal of partial of second  to 2012  . TONSILLECTOMY     Social History   Occupational History  . Not on file  Tobacco Use  . Smoking status: Former Smoker    Years: 2.00    Types: Cigarettes  . Smokeless tobacco: Never Used  . Tobacco comment: quit smoking cigarettes in the 80's  Substance and Sexual Activity  . Alcohol use: Not Currently    Comment:  "couple drinks maybe once/month"  . Drug use: Not Currently    Comment:  "recreational drugs in the 80's "  . Sexual activity: Not Currently

## 2019-07-29 NOTE — Telephone Encounter (Signed)
This has been faxed.

## 2019-08-03 ENCOUNTER — Encounter: Payer: Self-pay | Admitting: Orthopedic Surgery

## 2019-08-03 ENCOUNTER — Other Ambulatory Visit: Payer: Self-pay

## 2019-08-03 ENCOUNTER — Ambulatory Visit (INDEPENDENT_AMBULATORY_CARE_PROVIDER_SITE_OTHER): Payer: Medicare Other | Admitting: Orthopedic Surgery

## 2019-08-03 VITALS — Ht 76.0 in | Wt 380.0 lb

## 2019-08-03 DIAGNOSIS — T8781 Dehiscence of amputation stump: Secondary | ICD-10-CM

## 2019-08-04 ENCOUNTER — Encounter: Payer: Self-pay | Admitting: Orthopedic Surgery

## 2019-08-04 NOTE — Progress Notes (Signed)
Office Visit Note   Patient: Gabriel Howe           Date of Birth: Nov 29, 1960           MRN: [id number] Visit Date: 08/03/2019              Requested by: No referring provider defined for this encounter. PCP: System, Pcp Not In  Chief Complaint  Patient presents with  . Left Index Finger - Routine Post Op    07/03/2019 left finger partial amp      HPI: Patient is a 58 year old gentleman who presents in follow-up for revision left index finger amputation.  Patient has no pain no concerns he states the incision has healed nicely.  He also states that the ulcer over the right prepatellar bursa is almost completely healed.  Assessment & Plan: Visit Diagnoses:  1. Dehiscence of amputation stump (HCC)     Plan: Patient will continue continue with wound care for the right knee no intervention needed for the left hand.  Follow-Up Instructions: Return if symptoms worsen or fail to improve.   Ortho Exam  Patient is alert, oriented, no adenopathy, well-dressed, normal affect, normal respiratory effort. Examination the left hand index amputation is healed well there is some dependent redness with elevation and massage this completely resolved there is no tenderness to palpation no open wound no drainage no cellulitis.  Imaging: No results found. No images are attached to the encounter.  Labs: Lab Results  Component Value Date   HGBA1C 7.2 (H) 05/15/2015   HGBA1C 9.0 (H) 08/01/2013   HGBA1C 12.5 (H) 12/17/2012   REPTSTATUS 03/25/2019 FINAL 03/20/2019   GRAMSTAIN  03/20/2019    ABUNDANT WBC PRESENT,BOTH PMN AND MONONUCLEAR NO ORGANISMS SEEN    CULT  03/20/2019    RARE GROUP B STREP(S.AGALACTIAE)ISOLATED TESTING AGAINST S. AGALACTIAE NOT ROUTINELY PERFORMED DUE TO PREDICTABILITY OF AMP/PEN/VAN SUSCEPTIBILITY. RARE STREPTOCOCCUS CONSTELLATUS RESULT CALLED TO, READ BACK BY AND VERIFIED WITH: Enrigue Catena [id number] MLM NO ANAEROBES ISOLATED Performed at [facility],  [address], [place], [place] [address]      Lab Results  Component Value Date   ALBUMIN 3.2 (L) 03/20/2019   ALBUMIN 1.9 (L) 05/16/2015   ALBUMIN 2.6 (L) 08/19/2013    Lab Results  Component Value Date   MG 2.0 08/03/2013   No results found for: VD25OH  No results found for: PREALBUMIN CBC EXTENDED Latest Ref Rng & Units 07/03/2019 06/19/2019 03/20/2019  WBC 4.0 - 10.5 K/uL 10.2 10.6(H) 13.0(H)  RBC 4.22 - 5.81 MIL/uL 4.05(L) 5.02 5.28  HGB 13.0 - 17.0 g/dL 10.4(L) 13.0 13.5  HCT 39.0 - 52.0 % 33.0(L) 41.0 43.2  PLT 150 - 400 K/uL 400 303 333  NEUTROABS 1.7 - 7.7 K/uL - - 8.1(H)  LYMPHSABS 0.7 - 4.0 K/uL - - 3.6     Body mass index is 46.26 kg/m.  Orders:  No orders of the defined types were placed in this encounter.  No orders of the defined types were placed in this encounter.    Procedures: No procedures performed  Clinical Data: No additional findings.  ROS:  All other systems negative, except as noted in the HPI. Review of Systems  Objective: Vital Signs: Ht 6\' 4"  (1.93 m)   Wt (!) 380 lb (172.4 kg)   BMI 46.26 kg/m   Specialty Comments:  No specialty comments available.  PMFS History: Patient Active Problem List   Diagnosis Date Noted  . Dehiscence of  amputation stump ([place])   . Chronic osteomyelitis of hand including fingers, right ([place])   . Abscess of bursa of right knee   . Osteomyelitis of finger of right hand ([place]) 03/17/2019  . Non-pressure chronic ulcer of right calf, limited to breakdown of skin ([place]) 10/03/2016  . Status post bilateral below knee amputation ([place]) 05/15/2015  . Infection of below knee amputation stump ([place]) 05/15/2015  . Infection of amputation stump of right lower extremity ([place]) 05/15/2015  . Wound infection 05/15/2015  . Diabetic osteomyelitis ([place]) 08/05/2013  . Cellulitis and abscess of toe of left foot 08/01/2013  . Hyponatremia 08/01/2013  . Hypokalemia 08/01/2013  . Sepsis associated hypotension ([place])  08/01/2013  . Leukocytosis 08/01/2013  . Cellulitis and abscess of lower leg 12/17/2012  . DM foot ulcer ([place]) 12/17/2012  . DM2 (diabetes mellitus, type 2) ([place]) 12/17/2012  . Diabetic neuropathy ([place]) 12/17/2012   Past Medical History:  Diagnosis Date  . Arthritis    "ankles and feet" (08/19/2013)  . Bronchitis    hx of;last time about 66yrs ago  . Chronic back pain   . Exertional shortness of breath   . QMVHQION(629.5)    "monthly" (08/19/2013)  . Heart murmur    Hx: of as a child  . Hyperlipidemia    takes Atorvastatin daily  . Neuromuscular disorder ([place])    diabetic neuropathy in feet  . Osteomyelitis (HCC)    left index finger  . Type II diabetes mellitus ([place])    takes Metformin bid and Lantus nightly  . Wears glasses     Family History  Problem Relation Age of Onset  . Other Mother   . Other Father     Past Surgical History:  Procedure Laterality Date  . AMPUTATION Right 01/02/2013   Procedure: AMPUTATION BELOW KNEE;  Surgeon: Newt Minion, MD;  Location: [facility];  Service: Orthopedics;  Laterality: Right;  Right Below Knee Amputation  . AMPUTATION Left 04/25/2013   Procedure: AMPUTATION RAY;  Surgeon: Newt Minion, MD;  Location: [facility];  Service: Orthopedics;  Laterality: Left;  Left Foot 3rd Ray Amputation  . AMPUTATION Left 08/03/2013   Procedure: Left Midfoot Amputation;  Surgeon: Newt Minion, MD;  Location: [facility];  Service: Orthopedics;  Laterality: Left;  Left Foot Transmetatarsal Amputation  . AMPUTATION Left 08/19/2013   Procedure: AMPUTATION BELOW KNEE- left;  Surgeon: Newt Minion, MD;  Location: [facility];  Service: Orthopedics;  Laterality: Left;  Left Below Knee Amputation  . AMPUTATION Right 05/16/2015   Procedure: AMPUTATION BELOW KNEE/REVISION;  Surgeon: Newt Minion, MD;  Location: [facility];  Service: Orthopedics;  Laterality: Right;  . AMPUTATION     middle finger right hand  . AMPUTATION Right 03/20/2019   Procedure: REVISION AMPUTATION RIGHT INDEX  FINGER;  Surgeon: Newt Minion, MD;  Location: [facility];  Service: Orthopedics;  Laterality: Right;  . AMPUTATION Left 06/19/2019   Procedure: LEFT INDEX FINGER PARTIAL AMPUTATION;  Surgeon: Newt Minion, MD;  Location: [facility];  Service: Orthopedics;  Laterality: Left;  . AMPUTATION Left 07/03/2019   Procedure: AMPUTATION LEFT INDEX FINGER;  Surgeon: Newt Minion, MD;  Location: [facility];  Service: Orthopedics;  Laterality: Left;  . BELOW KNEE LEG AMPUTATION Left 08/19/2013  . BELOW KNEE LEG AMPUTATION Right 12/2012  . ESOPHAGOGASTRODUODENOSCOPY    . I&D EXTREMITY Right 03/20/2019   Procedure: IRRIGATION AND DEBRIDEMENT RIGHT PREPATELLAR BURSA;  Surgeon: Newt Minion, MD;  Location: [facility];  Service: Orthopedics;  Laterality: Right;  . IRRIGATION AND DEBRIDEMENT KNEE    . STUMP REVISION Right 05/16/2015  . TOE AMPUTATION Right 2012   at [facility], and removal of partial of second to 2012  . TONSILLECTOMY     Social History   Occupational History  . Not on file  Tobacco Use  . Smoking status: Former Smoker    Years: 2.00    Types: Cigarettes  . Smokeless tobacco: Never Used  . Tobacco comment: quit smoking cigarettes in the 80's  Substance and Sexual Activity  . Alcohol use: Not Currently    Comment:  "couple drinks maybe once/month"  . Drug use: Not Currently    Comment:  "recreational drugs in the 80's "  . Sexual activity: Not Currently
